# Patient Record
Sex: Female | Born: 1955
Health system: Southern US, Community
[De-identification: ages and names within clinical notes are randomized; demographics above are authoritative.]

## PROBLEM LIST (undated history)

## (undated) DIAGNOSIS — F32A Depression, unspecified: Secondary | ICD-10-CM

## (undated) DIAGNOSIS — F419 Anxiety disorder, unspecified: Secondary | ICD-10-CM

## (undated) DIAGNOSIS — R7303 Prediabetes: Secondary | ICD-10-CM

## (undated) DIAGNOSIS — F329 Major depressive disorder, single episode, unspecified: Secondary | ICD-10-CM

## (undated) DIAGNOSIS — K219 Gastro-esophageal reflux disease without esophagitis: Secondary | ICD-10-CM

## (undated) DIAGNOSIS — J301 Allergic rhinitis due to pollen: Secondary | ICD-10-CM

## (undated) DIAGNOSIS — I10 Essential (primary) hypertension: Secondary | ICD-10-CM

## (undated) HISTORY — DX: Depression, unspecified: F32.A

## (undated) HISTORY — DX: Allergic rhinitis due to pollen: J30.1

## (undated) HISTORY — DX: Gastro-esophageal reflux disease without esophagitis: K21.9

## (undated) HISTORY — DX: Anxiety disorder, unspecified: F41.9

## (undated) HISTORY — DX: Major depressive disorder, single episode, unspecified: F32.9

## (undated) HISTORY — PX: COLON SURGERY: SHX602

---

## 1998-07-20 ENCOUNTER — Other Ambulatory Visit: Admission: RE | Admit: 1998-07-20 | Discharge: 1998-07-20 | Payer: Self-pay | Admitting: Obstetrics and Gynecology

## 1998-08-19 HISTORY — PX: ABDOMINAL HYSTERECTOMY: SHX81

## 1998-08-20 ENCOUNTER — Inpatient Hospital Stay (HOSPITAL_COMMUNITY): Admission: RE | Admit: 1998-08-20 | Discharge: 1998-08-24 | Payer: Self-pay | Admitting: Obstetrics and Gynecology

## 2003-11-19 HISTORY — PX: HERNIA REPAIR: SHX51

## 2003-12-03 ENCOUNTER — Inpatient Hospital Stay (HOSPITAL_COMMUNITY): Admission: EM | Admit: 2003-12-03 | Discharge: 2003-12-08 | Payer: Self-pay | Admitting: Emergency Medicine

## 2003-12-04 ENCOUNTER — Encounter (INDEPENDENT_AMBULATORY_CARE_PROVIDER_SITE_OTHER): Payer: Self-pay | Admitting: Specialist

## 2004-11-01 ENCOUNTER — Ambulatory Visit: Payer: Self-pay | Admitting: Internal Medicine

## 2004-11-09 ENCOUNTER — Ambulatory Visit: Payer: Self-pay | Admitting: Internal Medicine

## 2004-12-23 ENCOUNTER — Ambulatory Visit: Payer: Self-pay | Admitting: Internal Medicine

## 2005-03-22 ENCOUNTER — Encounter: Admission: RE | Admit: 2005-03-22 | Discharge: 2005-03-22 | Payer: Self-pay | Admitting: Internal Medicine

## 2005-11-24 ENCOUNTER — Ambulatory Visit: Payer: Self-pay | Admitting: Internal Medicine

## 2006-03-22 ENCOUNTER — Ambulatory Visit: Payer: Self-pay | Admitting: Internal Medicine

## 2006-08-07 ENCOUNTER — Ambulatory Visit: Payer: Self-pay | Admitting: Internal Medicine

## 2006-08-14 ENCOUNTER — Ambulatory Visit: Payer: Self-pay

## 2006-08-18 ENCOUNTER — Ambulatory Visit (HOSPITAL_COMMUNITY): Admission: RE | Admit: 2006-08-18 | Discharge: 2006-08-18 | Payer: Self-pay | Admitting: Family Medicine

## 2006-08-24 ENCOUNTER — Ambulatory Visit: Payer: Self-pay | Admitting: Family Medicine

## 2006-08-24 LAB — FECAL OCCULT BLOOD, GUAIAC: Fecal Occult Blood: NEGATIVE

## 2007-04-19 ENCOUNTER — Encounter: Payer: Self-pay | Admitting: Internal Medicine

## 2007-05-21 DIAGNOSIS — F329 Major depressive disorder, single episode, unspecified: Secondary | ICD-10-CM | POA: Insufficient documentation

## 2007-05-21 DIAGNOSIS — F411 Generalized anxiety disorder: Secondary | ICD-10-CM | POA: Insufficient documentation

## 2007-05-21 DIAGNOSIS — N951 Menopausal and female climacteric states: Secondary | ICD-10-CM | POA: Insufficient documentation

## 2007-05-22 ENCOUNTER — Ambulatory Visit: Payer: Self-pay | Admitting: Internal Medicine

## 2007-05-22 DIAGNOSIS — R5383 Other fatigue: Secondary | ICD-10-CM

## 2007-05-22 DIAGNOSIS — R5381 Other malaise: Secondary | ICD-10-CM | POA: Insufficient documentation

## 2007-05-23 LAB — CONVERTED CEMR LAB
ALT: 22 units/L (ref 0–40)
Albumin: 3.7 g/dL (ref 3.5–5.2)
BUN: 7 mg/dL (ref 6–23)
Basophils Absolute: 0 10*3/uL (ref 0.0–0.1)
Basophils Relative: 0.6 % (ref 0.0–1.0)
CO2: 30 meq/L (ref 19–32)
Calcium: 8.8 mg/dL (ref 8.4–10.5)
Chloride: 106 meq/L (ref 96–112)
Creatinine, Ser: 0.7 mg/dL (ref 0.4–1.2)
Eosinophils Absolute: 0.1 10*3/uL (ref 0.0–0.6)
Eosinophils Relative: 1.2 % (ref 0.0–5.0)
GFR calc Af Amer: 113 mL/min
GFR calc non Af Amer: 94 mL/min
Glucose, Bld: 92 mg/dL (ref 70–99)
HCT: 43.2 % (ref 36.0–46.0)
Hemoglobin: 14.9 g/dL (ref 12.0–15.0)
Lymphocytes Relative: 28 % (ref 12.0–46.0)
MCHC: 34.5 g/dL (ref 30.0–36.0)
MCV: 96.7 fL (ref 78.0–100.0)
Monocytes Absolute: 0.5 10*3/uL (ref 0.2–0.7)
Monocytes Relative: 7.6 % (ref 3.0–11.0)
Neutro Abs: 3.9 10*3/uL (ref 1.4–7.7)
Neutrophils Relative %: 62.6 % (ref 43.0–77.0)
Phosphorus: 2.7 mg/dL (ref 2.3–4.6)
Platelets: 251 10*3/uL (ref 150–400)
Potassium: 4.6 meq/L (ref 3.5–5.1)
RBC: 4.47 M/uL (ref 3.87–5.11)
RDW: 11.6 % (ref 11.5–14.6)
Sodium: 140 meq/L (ref 135–145)
TSH: 1.19 microintl units/mL (ref 0.35–5.50)
WBC: 6.3 10*3/uL (ref 4.5–10.5)

## 2007-09-24 ENCOUNTER — Telehealth (INDEPENDENT_AMBULATORY_CARE_PROVIDER_SITE_OTHER): Payer: Self-pay | Admitting: *Deleted

## 2007-12-24 ENCOUNTER — Telehealth (INDEPENDENT_AMBULATORY_CARE_PROVIDER_SITE_OTHER): Payer: Self-pay | Admitting: *Deleted

## 2008-03-17 ENCOUNTER — Ambulatory Visit: Payer: Self-pay | Admitting: Internal Medicine

## 2008-05-05 ENCOUNTER — Telehealth (INDEPENDENT_AMBULATORY_CARE_PROVIDER_SITE_OTHER): Payer: Self-pay | Admitting: *Deleted

## 2008-05-22 ENCOUNTER — Telehealth (INDEPENDENT_AMBULATORY_CARE_PROVIDER_SITE_OTHER): Payer: Self-pay | Admitting: *Deleted

## 2008-07-22 ENCOUNTER — Telehealth (INDEPENDENT_AMBULATORY_CARE_PROVIDER_SITE_OTHER): Payer: Self-pay | Admitting: *Deleted

## 2008-09-04 ENCOUNTER — Telehealth: Payer: Self-pay | Admitting: Internal Medicine

## 2008-10-23 ENCOUNTER — Ambulatory Visit: Payer: Self-pay | Admitting: Internal Medicine

## 2008-11-04 ENCOUNTER — Ambulatory Visit: Payer: Self-pay | Admitting: Internal Medicine

## 2008-11-04 LAB — CONVERTED CEMR LAB: Fecal Occult Bld: NEGATIVE

## 2008-11-07 ENCOUNTER — Telehealth: Payer: Self-pay | Admitting: Internal Medicine

## 2008-11-10 ENCOUNTER — Telehealth: Payer: Self-pay | Admitting: Internal Medicine

## 2008-12-08 ENCOUNTER — Encounter (INDEPENDENT_AMBULATORY_CARE_PROVIDER_SITE_OTHER): Payer: Self-pay | Admitting: *Deleted

## 2009-04-13 ENCOUNTER — Telehealth: Payer: Self-pay | Admitting: Internal Medicine

## 2009-11-02 ENCOUNTER — Telehealth: Payer: Self-pay | Admitting: Internal Medicine

## 2009-12-02 ENCOUNTER — Ambulatory Visit: Payer: Self-pay | Admitting: Internal Medicine

## 2009-12-22 ENCOUNTER — Ambulatory Visit: Payer: Self-pay | Admitting: Internal Medicine

## 2009-12-28 ENCOUNTER — Encounter: Payer: Self-pay | Admitting: Internal Medicine

## 2009-12-28 LAB — CONVERTED CEMR LAB: Fecal Occult Bld: NEGATIVE

## 2009-12-30 ENCOUNTER — Telehealth: Payer: Self-pay | Admitting: Internal Medicine

## 2010-02-09 ENCOUNTER — Ambulatory Visit (HOSPITAL_COMMUNITY): Admission: RE | Admit: 2010-02-09 | Discharge: 2010-02-09 | Payer: Self-pay | Admitting: Internal Medicine

## 2010-02-09 LAB — HM MAMMOGRAPHY: HM Mammogram: NEGATIVE

## 2010-02-12 ENCOUNTER — Encounter: Payer: Self-pay | Admitting: Internal Medicine

## 2010-03-01 ENCOUNTER — Telehealth: Payer: Self-pay | Admitting: Internal Medicine

## 2010-03-02 ENCOUNTER — Ambulatory Visit: Payer: Self-pay | Admitting: Internal Medicine

## 2010-05-24 ENCOUNTER — Telehealth: Payer: Self-pay | Admitting: Internal Medicine

## 2010-09-06 ENCOUNTER — Ambulatory Visit: Payer: Self-pay | Admitting: Internal Medicine

## 2010-11-19 ENCOUNTER — Encounter: Payer: Self-pay | Admitting: Internal Medicine

## 2010-11-19 ENCOUNTER — Telehealth: Payer: Self-pay | Admitting: Internal Medicine

## 2011-01-09 ENCOUNTER — Encounter: Payer: Self-pay | Admitting: Internal Medicine

## 2011-01-14 ENCOUNTER — Ambulatory Visit
Admission: RE | Admit: 2011-01-14 | Discharge: 2011-01-14 | Payer: Self-pay | Source: Home / Self Care | Attending: Internal Medicine | Admitting: Internal Medicine

## 2011-01-14 DIAGNOSIS — K219 Gastro-esophageal reflux disease without esophagitis: Secondary | ICD-10-CM | POA: Insufficient documentation

## 2011-01-14 DIAGNOSIS — R350 Frequency of micturition: Secondary | ICD-10-CM | POA: Insufficient documentation

## 2011-01-14 LAB — CONVERTED CEMR LAB
Bilirubin Urine: NEGATIVE
Glucose, Urine, Semiquant: NEGATIVE
Ketones, urine, test strip: NEGATIVE
Nitrite: NEGATIVE
Specific Gravity, Urine: 1.015
Urobilinogen, UA: 0.2
WBC Urine, dipstick: NEGATIVE
pH: 7

## 2011-01-18 NOTE — Letter (Signed)
Summary: Results Follow up Letter  [place] at [facility]  [address] [place], [place] [address]   Phone: [phone]  Fax: [phone]    02/12/2010 MRN: [id number]  [facility] [address] [place], [place]  [address]  Dear Ms. [name],  The following are the results of your recent test(s):  Test         Result    Pap Smear:        Normal _____  Not Normal _____ Comments: ______________________________________________________ Cholesterol: LDL(Bad cholesterol):         Your goal is less than:         HDL (Good cholesterol):       Your goal is more than: Comments:  ______________________________________________________ Mammogram:        Normal __X___  Not Normal _____ Comments: Repeat in 1-2 years  ___________________________________________________________________ Hemoccult:        Normal _____  Not normal _______ Comments:    _____________________________________________________________________ Other Tests:    We routinely do not discuss normal results over the telephone.  If you desire a copy of the results, or you have any questions about this information we can discuss them at your next office visit.   Sincerely,      Tillman Abide, MD

## 2011-01-18 NOTE — Progress Notes (Signed)
Summary: refill request for xanax  Phone Note Refill Request Message from:  Fax from Pharmacy  Refills Requested: Medication #1:  XANAX 0.5 MG TABS Take 1/2-1 tablet by mouth twice a day   Last Refilled: 04/07/2010 Faxed request from [place] is on your desk.  Initial call taken by: Lowella Petties CMA,  May 24, 2010 3:06 PM  Follow-up for Phone Call        okay #60 x 1 Follow-up by: Cindee Salt MD,  May 24, 2010 5:50 PM  Additional Follow-up for Phone Call Additional follow up Details #1::        Rx faxed to pharmacy Additional Follow-up by: DeShannon Smith CMA Duncan Dull),  May 25, 2010 8:39 AM    Prescriptions: Prudy Feeler 0.5 MG TABS (ALPRAZOLAM) Take 1/2-1 tablet by mouth twice a day  #60 x 1   Entered by:   Mervin Hack CMA (AAMA)   Authorized by:   Cindee Salt MD   Signed by:   Mervin Hack CMA (AAMA) on 05/25/2010   Method used:   Handwritten   RxID:   [id number]

## 2011-01-18 NOTE — Letter (Signed)
Summary: Results Follow up Letter  [place] at [facility]  [address] [place], [place] [address]   Phone: [phone]  Fax: [phone]    12/28/2009 MRN: [id number]  [facility] [address] [place], [place]  [address]  Dear [name],  The following are the results of your recent test(s):  Test         Result    Pap Smear:        Normal _____  Not Normal _____ Comments: ______________________________________________________ Cholesterol: LDL(Bad cholesterol):         Your goal is less than:         HDL (Good cholesterol):       Your goal is more than: Comments:  ______________________________________________________ Mammogram:        Normal _____  Not Normal _____ Comments:  ___________________________________________________________________ Hemoccult:        Normal __X___  Not normal _______ Comments:  Stool test is negative for blood we will check this again in a year.  _____________________________________________________________________ Other Tests:    We routinely do not discuss normal results over the telephone.  If you desire a copy of the results, or you have any questions about this information we can discuss them at your next office visit.   Sincerely,     Tillman Abide, MD

## 2011-01-18 NOTE — Assessment & Plan Note (Signed)
Summary: 6 m f/u dlo   Vital Signs:  Patient profile:   55 year old female Weight:      184 pounds BMI:     30.26 Temp:     97.9 degrees F oral Pulse rate:   72 / minute Pulse rhythm:   regular BP sitting:   120 / 80  (left arm) Cuff size:   regular  Vitals Entered By: Mervin Hack CMA Duncan Dull) (September 06, 2010 4:00 PM) CC: 6 month follow-up   History of Present Illness: Doing better Now takes the xanax preemptively if she feels like she is about to panic also goes out and takes a walk, which helps her anxiety also  Tends to have a hard time if she is in a hospital Gets initial feeling like heartburn--then generalized Still takes the xanax at night and it helps her sleep  Still has refill so she has only rarely used more than her bedtime dose  Still with some depression Bad day yesterday but not day after day Upset about husband's condition His cognitive problems have made it hard for her to even talk to him He occ has paranoid tendencies also  Has been fatigued  lesion on nose and  left forehead   Allergies: No Known Drug Allergies  Past History:  Past medical, surgical, family and social histories (including risk factors) reviewed for relevance to current acute and chronic problems.  Past Medical History: Reviewed history from 05/21/2007 and no changes required. Depression Anxiety  Past Surgical History: Reviewed history from 10/23/2008 and no changes required. Hysterectomy BSO-- fibroids  9/99 RIH (ingram)  11/2003 Cardiolite stress 07/2006  Family History: Reviewed history from 10/23/2008 and no changes required. CAD in multiple family members Brother 45  with recent MI Dad with CVA  Social History: Reviewed history from 03/17/2008 and no changes required. Married Never Smoked Alcohol use-no Cares for husband with MS Cleans houses  Review of Systems       still gets itch over left deltoid----fortunately not recently occ food  allergies---intolerant of sauce, then yogurt. Feels nauseated, thenn it passes  Physical Exam  General:  alert and normal appearance.   Skin:  tiny red spot on top of nose scratch on left forehead Psych:  normally interactive, good eye contact, not anxious appearing, and not depressed appearing.     Impression & Recommendations:  Problem # 1:  ANXIETY (ICD-300.00) Assessment Unchanged doing better with behavioral measures (walking) and using the xanax if she needs it (when she feels the panic just starting) Her updated medication list for this problem includes:    Xanax 0.5 Mg Tabs (Alprazolam) .Marland Kitchen... Take 1/2-1 tablet by mouth twice a day  Complete Medication List: 1)  Estradiol 1 Mg Tabs (Estradiol) .... Take 1 by mouth once daily 2)  Xanax 0.5 Mg Tabs (Alprazolam) .... Take 1/2-1 tablet by mouth twice a day 3)  Oyster Calcium + D 500-125 Mg-unit Tabs (Calcium-vitamin d) .... Take 2 by mouth daily  Patient Instructions: 1)  Please schedule a follow-up appointment in 6 months for physical  Current Allergies (reviewed today): No known allergies

## 2011-01-18 NOTE — Progress Notes (Signed)
Summary: Rx Alprazolam  Phone Note Refill Request Call back at [phone] Message from:  [facility] on December 30, 2009 11:38 AM  Refills Requested: Medication #1:  XANAX 0.5 MG TABS Take 1/2-1 tablet by mouth twice a day   Last Refilled: 11/02/2009 Received faxed refill request, please advise   Method Requested: Electronic Initial call taken by: Linde Gillis CMA Duncan Dull),  December 30, 2009 11:39 AM  Follow-up for Phone Call        okay #60 x 0 Follow-up by: Cindee Salt MD,  December 30, 2009 3:30 PM  Additional Follow-up for Phone Call Additional follow up Details #1::        Rx called to pharmacy Additional Follow-up by: Linde Gillis CMA Duncan Dull),  December 30, 2009 3:53 PM

## 2011-01-18 NOTE — Assessment & Plan Note (Signed)
Summary: PANIC ATTACKS/CLE   Vital Signs:  Patient profile:   55 year old female Weight:      181 pounds Temp:     98.1 degrees F oral Pulse rate:   64 / minute Pulse rhythm:   regular BP sitting:   130 / 90  (left arm) Cuff size:   regular  Vitals Entered By: Mervin Hack CMA Duncan Dull) (March 02, 2010 10:23 AM) CC: panic attack   History of Present Illness: Having more freq  panic attacks again Having back in the fall and seemed to improve after she stopped benedryl More of an issue back in  2006 Feels "like labor pains in my chest" Last 20-40 minutes Has awakened her at night  feels like she is going to have a heart attack In past would get forewarning like heartburn type discomfort  Does have triggers with any hospital exposure when they come on, generally isn't doing anything  Last one was 8 days ago very hard to gauge but not that frequent  Has circumstantatial depression Can have bad times but she always continues with all her responsibilities Most it last is 1 day  Allergies: No Known Drug Allergies  Past History:  Past medical, surgical, family and social histories (including risk factors) reviewed for relevance to current acute and chronic problems.  Past Medical History: Reviewed history from 05/21/2007 and no changes required. Depression Anxiety  Past Surgical History: Reviewed history from 10/23/2008 and no changes required. Hysterectomy BSO-- fibroids  9/99 RIH (ingram)  11/2003 Cardiolite stress 07/2006  Family History: Reviewed history from 10/23/2008 and no changes required. CAD in multiple family members Brother 39  with recent MI Dad with CVA  Social History: Reviewed history from 03/17/2008 and no changes required. Married Never Smoked Alcohol use-no Cares for husband with MS Cleans houses  Review of Systems       Uses the xanax at bedtime--  0.5mg  nightly (at 8PM, then goes to sleep at 10PM) weight is stable  Physical  Exam  General:  alert and normal appearance.   Psych:  normally interactive, good eye contact, not anxious appearing, and not depressed appearing.     Impression & Recommendations:  Problem # 1:  ANXIETY (ICD-300.00) Assessment Comment Only has panic attacks intermittently discussed using the xanax would consider daily SSRI if having more than 1 panic attack weekly  Her updated medication list for this problem includes:    Xanax 0.5 Mg Tabs (Alprazolam) .Marland Kitchen... Take 1/2-1 tablet by mouth twice a day  Complete Medication List: 1)  Estradiol 1 Mg Tabs (Estradiol) .... Take 1 by mouth once daily 2)  Xanax 0.5 Mg Tabs (Alprazolam) .... Take 1/2-1 tablet by mouth twice a day 3)  Oyster Calcium + D 500-125 Mg-unit Tabs (Calcium-vitamin d) .... Take 2 by mouth daily  Patient Instructions: 1)  Please call if the panic attacks occur more than one weekly on average 2)  Please schedule a follow-up appointment in 6 months .   Current Allergies (reviewed today): No known allergies

## 2011-01-18 NOTE — Progress Notes (Signed)
Summary: xanax  Phone Note Refill Request Message from:  Fax from Pharmacy on November 19, 2010 11:38 AM  Refills Requested: Medication #1:  XANAX 0.5 MG TABS Take 1/2-1 tablet by mouth twice a day   Last Refilled: 09/04/2010 Refill request from [place]. [phone]. Fax is on your desk.    Initial call taken by: Melody Comas,  November 19, 2010 11:40 AM  Follow-up for Phone Call        okay #60 x 1 Follow-up by: Cindee Salt MD,  November 19, 2010 1:07 PM  Additional Follow-up for Phone Call Additional follow up Details #1::        Rx called to pharmacy Additional Follow-up by: Linde Gillis CMA Duncan Dull),  November 19, 2010 2:06 PM    Prescriptions: XANAX 0.5 MG TABS (ALPRAZOLAM) Take 1/2-1 tablet by mouth twice a day  #60 x 1   Entered by:   Linde Gillis CMA (AAMA)   Authorized by:   Cindee Salt MD   Signed by:   Linde Gillis CMA (AAMA) on 11/19/2010   Method used:   Telephoned to ...       [facility]* (retail)       [address]       [place], [place]  [address]       Ph: [phone]       Fax: [phone]   RxID:   [id number]

## 2011-01-18 NOTE — Progress Notes (Signed)
Summary: Rx Alprazolam  Phone Note Refill Request Call back at [phone] Message from:  [facility] on March 01, 2010 10:08 AM  Refills Requested: Medication #1:  XANAX 0.5 MG TABS Take 1/2-1 tablet by mouth twice a day   Last Refilled: 12/30/2009 Received faxed refill request, form in your IN box.     Method Requested: Fax to Local Pharmacy Initial call taken by: Linde Gillis CMA Duncan Dull),  March 01, 2010 10:08 AM  Follow-up for Phone Call        okay #60 x 1 Follow-up by: Cindee Salt MD,  March 01, 2010 1:25 PM  Additional Follow-up for Phone Call Additional follow up Details #1::        [facility] [place] [facility] Additional Follow-up by: Mervin Hack CMA Duncan Dull),  March 01, 2010 2:38 PM    Prescriptions: Prudy Feeler 0.5 MG TABS (ALPRAZOLAM) Take 1/2-1 tablet by mouth twice a day  #60 x 1   Entered by:   Mervin Hack CMA (AAMA)   Authorized by:   Cindee Salt MD   Signed by:   Mervin Hack CMA (AAMA) on 03/01/2010   Method used:   Handwritten   RxID:   [id number]

## 2011-01-20 NOTE — Assessment & Plan Note (Signed)
Summary: 8:15 PERSONAL/CLE   Vital Signs:  Patient profile:   55 year old female Weight:      185 pounds Temp:     98.4 degrees F oral Pulse rate:   73 / minute Pulse rhythm:   regular BP sitting:   133 / 85  (left arm) Cuff size:   regular  Vitals Entered By: Mervin Hack CMA Duncan Dull) (January 14, 2011 8:26 AM) CC: personal   History of Present Illness: Concerned about prominence of right inner canthus Thinks it is the tear duct No pain No epiphoria No discharge in AM  Most concerned about urinary frequency May occur again within minutes if she drinks tea Went 14 times in 1 day "I'm tired of it" Ready to try meds  No hematuria No dysuria  More problems with stomach  gets sick after certain meals no clear water brash tried husbnad's "purple pill" and symtpoms gone  Allergies: No Known Drug Allergies  Past History:  Past medical, surgical, family and social histories (including risk factors) reviewed for relevance to current acute and chronic problems.  Past Medical History: Depression Anxiety GERD  Past Surgical History: Reviewed history from 10/23/2008 and no changes required. Hysterectomy BSO-- fibroids  9/99 RIH (ingram)  11/2003 Cardiolite stress 07/2006  Family History: Reviewed history from 10/23/2008 and no changes required. CAD in multiple family members Brother 29  with recent MI Dad with CVA  Social History: Reviewed history from 03/17/2008 and no changes required. Married Never Smoked Alcohol use-no Cares for husband with MS Cleans houses  Review of Systems       Some constipation Uses metamucil--should be able to stop on weight watchers since increased roughage keeps her regular  Physical Exam  Eyes:  pupils equal, pupils round, and pupils reactive to light.  Prominent inner canthi but no abnormalities noted Tear ducts look normal Abdomen:  soft, non-tender, and no masses.   Msk:  No CVA tenderness   Impression &  Recommendations:  Problem # 1:  FREQUENCY, URINARY (ICD-788.41) Assessment Deteriorated  ready to try meds will try oxybutynin discussed side effects--mostly dry mouth and dizziness  Her updated medication list for this problem includes:    Oxybutynin Chloride 10 Mg Xr24h-tab (Oxybutynin chloride) .Marland Kitchen... 1 daily to reduce urinary frequency  Problem # 2:  GERD (ICD-530.81) Assessment: New  will give omeprazole for as needed use discussed benefit if she gets symptom relief without daily dosing  Her updated medication list for this problem includes:    Omeprazole 20 Mg Cpdr (Omeprazole) .Marland Kitchen... 1 daily as needed for acid reflux symptoms  Complete Medication List: 1)  Estradiol 1 Mg Tabs (Estradiol) .... Take 1 by mouth once daily 2)  Xanax 0.5 Mg Tabs (Alprazolam) .... Take 1/2-1 tablet by mouth twice a day 3)  Oyster Calcium + D 500-125 Mg-unit Tabs (Calcium-vitamin d) .... Take 2 by mouth daily 4)  Omeprazole 20 Mg Cpdr (Omeprazole) .Marland Kitchen.. 1 daily as needed for acid reflux symptoms 5)  Oxybutynin Chloride 10 Mg Xr24h-tab (Oxybutynin chloride) .Marland Kitchen.. 1 daily to reduce urinary frequency  Patient Instructions: 1)  Please keep March physical appointment Prescriptions: OXYBUTYNIN CHLORIDE 10 MG XR24H-TAB (OXYBUTYNIN CHLORIDE) 1 daily to reduce urinary frequency  #30 x 11   Entered and Authorized by:   Cindee Salt MD   Signed by:   Cindee Salt MD on 01/14/2011   Method used:   Electronically to        [facility]* (retail)  [address]       [place], [place]  [address]       Ph: [phone]       Fax: [phone]   RxID:   [id number] OMEPRAZOLE 20 MG CPDR (OMEPRAZOLE) 1 daily as needed for acid reflux symptoms  #30 x 11   Entered and Authorized by:   Cindee Salt MD   Signed by:   Cindee Salt MD on 01/14/2011   Method used:   Electronically to        [facility]* (retail)       [address]       [place], [place]  [address]       Ph:  [phone]       Fax: [phone]   RxID:   [id number]      Current Allergies (reviewed today): No known allergies   Laboratory Results   Urine Tests  Date/Time Received: January 14, 2011 8:50 AM Date/Time Reported: January 14, 2011 8:50 AM  Routine Urinalysis   Color: yellow Appearance: Hazy Glucose: negative   (Normal Range: Negative) Bilirubin: negative   (Normal Range: Negative) Ketone: negative   (Normal Range: Negative) Spec. Gravity: 1.015   (Normal Range: 1.003-1.035) Blood: trace-lysed   (Normal Range: Negative) pH: 7.0   (Normal Range: 5.0-8.0) Protein: trace   (Normal Range: Negative) Urobilinogen: 0.2   (Normal Range: 0-1) Nitrite: negative   (Normal Range: Negative) Leukocyte Esterace: negative   (Normal Range: Negative)

## 2011-01-20 NOTE — Medication Information (Signed)
Summary: Alprazolam/[facility]  Alprazolam/[facility]   Imported By: Lanelle Bal 12/03/2010 09:42:29  _____________________________________________________________________  External Attachment:    Type:   Image     Comment:   External Document

## 2011-01-26 ENCOUNTER — Encounter: Payer: Self-pay | Admitting: Internal Medicine

## 2011-02-17 ENCOUNTER — Other Ambulatory Visit: Payer: Self-pay | Admitting: Internal Medicine

## 2011-02-21 ENCOUNTER — Ambulatory Visit (INDEPENDENT_AMBULATORY_CARE_PROVIDER_SITE_OTHER): Payer: BC Managed Care – PPO | Admitting: Family Medicine

## 2011-02-21 ENCOUNTER — Other Ambulatory Visit: Payer: Self-pay | Admitting: Family Medicine

## 2011-02-21 ENCOUNTER — Encounter: Payer: Self-pay | Admitting: Family Medicine

## 2011-02-21 DIAGNOSIS — N644 Mastodynia: Secondary | ICD-10-CM

## 2011-02-24 ENCOUNTER — Encounter (INDEPENDENT_AMBULATORY_CARE_PROVIDER_SITE_OTHER): Payer: Self-pay | Admitting: *Deleted

## 2011-02-24 ENCOUNTER — Ambulatory Visit
Admission: RE | Admit: 2011-02-24 | Discharge: 2011-02-24 | Disposition: A | Discharge status: Home / Self Care | Payer: BC Managed Care – PPO | Source: Ambulatory Visit | Attending: Family Medicine | Admitting: Family Medicine

## 2011-02-24 DIAGNOSIS — N644 Mastodynia: Secondary | ICD-10-CM

## 2011-02-28 ENCOUNTER — Telehealth: Payer: Self-pay | Admitting: Internal Medicine

## 2011-03-01 NOTE — Miscellaneous (Signed)
Summary: Mammogram to flowsheet  Clinical Lists Changes  Observations: Added new observation of MAMMO DUE: 02/2012 (02/24/2011 16:43) Added new observation of MAMMOGRAM: normal (02/24/2011 16:43)      Preventive Care Screening  Mammogram:    Date:  02/24/2011    Next Due:  02/2012    Results:  normal     Birads 1

## 2011-03-01 NOTE — Assessment & Plan Note (Signed)
Summary: SHOOTING PAINS IN BREASTS   Vital Signs:  Patient profile:   55 year old female Weight:      186.75 pounds Temp:     98.3 degrees F oral Pulse rate:   76 / minute Pulse rhythm:   regular BP sitting:   124 / 80  (left arm) Cuff size:   large  Vitals Entered By: Selena Batten Dance CMA Duncan Dull) (February 21, 2011 8:17 AM) CC: Breast pains   History of Present Illness: CC: breast pain  2wk h/o sharp shooting breast pains R>L start middle of breast and shoot out towards nipple.  currently pain resolved but tender around areola.  No discharge, no lumps/bumps.  Does have h/o lumpy breasts.    Hysterectomy 1999.  Taking estrogen pill for several years now.  started oxybutynin 1 month ago (helps a little).  Due for mammogram, awaiting to schedule mammo to see what we think.  Previously have been normal.  Current Medications (verified): 1)  Estradiol 1 Mg Tabs (Estradiol) .... Take 1 By Mouth Once Daily 2)  Xanax 0.5 Mg Tabs (Alprazolam) .... Take 1/2-1 Tablet By Mouth Twice A Day 3)  Oyster Calcium + D 500-125 Mg-Unit Tabs (Calcium-Vitamin D) .... Take 2 By Mouth Daily 4)  Omeprazole 20 Mg Cpdr (Omeprazole) .Marland Kitchen.. 1 Daily As Needed For Acid Reflux Symptoms 5)  Oxybutynin Chloride 10 Mg Xr24h-Tab (Oxybutynin Chloride) .Marland Kitchen.. 1 Daily To Reduce Urinary Frequency 6)  St Johns Wort 300 Mg Caps ([address] Johns Wort) .Marland Kitchen.. 1 By Mouth Three Times A Day 7)  Multivitamins  Tabs (Multiple Vitamin) .Marland Kitchen.. 1 By Mouth Once Daily  Allergies (verified): 1)  * Eggs  Past History:  Past Medical History: Last updated: 01/14/2011 Depression Anxiety GERD  Family History: CAD in multiple family members Brother 50 with recent MI Dad with CVA Mgreat aunt - breast CA  Social History: Reviewed history from 03/17/2008 and no changes required. Married Never Smoked Alcohol use-no Cares for husband with MS Cleans houses  Review of Systems       per HPI  Physical Exam  General:  alert and normal appearance.    Mouth:  Oral mucosa and oropharynx without lesions or exudates.  Teeth in good repair. Neck:  No deformities, masses, or tenderness noted. Breasts:  No mass, nodules, thickening, tenderness, bulging, retraction, inflamation, nipple discharge or skin changes noted.  no supraclavicular or axillary LAD appreciated.  some tenderness R breast inferior to areola but no mass appreciated Lungs:  Normal respiratory effort, chest expands symmetrically. Lungs are clear to auscultation, no crackles or wheezes. Heart:  Normal rate and regular rhythm. S1 and S2 normal without gallop, murmur, click, rub or other extra sounds.   Impression & Recommendations:  Problem # 1:  MASTALGIA (ICD-611.71) R>L centered around areola.  unclear etiology of breast pain.  exam reassuring.  no evidence of infection or mass.  no significant ductal ectasia.  referral for dx mammo/US.    Orders: Radiology Referral (Radiology)  Complete Medication List: 1)  Estradiol 1 Mg Tabs (Estradiol) .... Take 1 by mouth once daily 2)  Xanax 0.5 Mg Tabs (Alprazolam) .... Take 1/2-1 tablet by mouth twice a day 3)  Oyster Calcium + D 500-125 Mg-unit Tabs (Calcium-vitamin d) .... Take 2 by mouth daily 4)  Omeprazole 20 Mg Cpdr (Omeprazole) .Marland Kitchen.. 1 daily as needed for acid reflux symptoms 5)  Oxybutynin Chloride 10 Mg Xr24h-tab (Oxybutynin chloride) .Marland Kitchen.. 1 daily to reduce urinary frequency 6)  [employer] Wort  300 Mg Caps (St johns wort) .Marland Kitchen.. 1 by mouth three times a day 7)  Multivitamins Tabs (Multiple vitamin) .Marland Kitchen.. 1 by mouth once daily  Patient Instructions: 1)  I'm not sure what caused the pain. 2)  Recommed trying tylenol for discomfort as well as supportive bras. 3)  Pass by Marion's office to schedule diagnostic mammogram. 4)  Call clinic with questions. 5)  Keep appointment for end of March.   Orders Added: 1)  Radiology Referral [Radiology] 2)  Est. Patient Level III [57846]    Current Allergies (reviewed today): * EGGS

## 2011-03-01 NOTE — Letter (Signed)
Summary: Results Follow up Letter  [place] at [facility]  [address] [place], [place] [address]   Phone: [phone]  Fax: [phone]    02/24/2011 MRN: [id number]      [facility] [address] [place], [place]  [address]  [place]      Dear Ms. Bucholz,  The following are the results of your recent test(s):  Test         Result    Pap Smear:        Normal _____  Not Normal _____ Comments: ______________________________________________________ Cholesterol: LDL(Bad cholesterol):         Your goal is less than:         HDL (Good cholesterol):       Your goal is more than: Comments:  ______________________________________________________ Mammogram:        Normal __X__  Not Normal _____ Comments: Repeat in 1 year  ___________________________________________________________________ Hemoccult:        Normal _____  Not normal _______ Comments:    _____________________________________________________________________ Other Tests:    We routinely do not discuss normal results over the telephone.  If you desire a copy of the results, or you have any questions about this information we can discuss them at your next office visit.   Sincerely,       Dr. Eustaquio Boyden

## 2011-03-08 NOTE — Progress Notes (Signed)
Summary: pt requests something for yeast  Phone Note Call from Patient Call back at [phone]   Caller: Patient Summary of Call: Pt states she has a vaginal yeast infection and is asking that something be called to [facility].  She has thick white discharge, some itching. She says you usually call something for her. Initial call taken by: Lowella Petties CMA, AAMA,  February 28, 2011 9:45 AM  Follow-up for Phone Call        okay fluconazole 150mg  #2 x 1  Take 1 today for yeast infection and repeat in 1 week as needed  Follow-up by: Cindee Salt MD,  February 28, 2011 10:02 AM  Additional Follow-up for Phone Call Additional follow up Details #1::        Rx Called In, Spoke with patient and advised results.  Additional Follow-up by: Mervin Hack CMA Duncan Dull),  February 28, 2011 4:10 PM    New/Updated Medications: FLUCONAZOLE 150 MG TABS (FLUCONAZOLE) Take 1 today for yeast infection and repeat in 1 week as needed Prescriptions: FLUCONAZOLE 150 MG TABS (FLUCONAZOLE) Take 1 today for yeast infection and repeat in 1 week as needed  #2 x 1   Entered by:   Mervin Hack CMA (AAMA)   Authorized by:   Cindee Salt MD   Signed by:   Mervin Hack CMA (AAMA) on 02/28/2011   Method used:   Electronically to        [facility]* (retail)       [address]       [place], [place]  [address]       Ph: [phone]       Fax: [phone]   RxID:   [id number]

## 2011-03-14 ENCOUNTER — Ambulatory Visit (INDEPENDENT_AMBULATORY_CARE_PROVIDER_SITE_OTHER): Payer: BC Managed Care – PPO | Admitting: Internal Medicine

## 2011-03-14 ENCOUNTER — Encounter: Payer: Self-pay | Admitting: Internal Medicine

## 2011-03-14 VITALS — BP 135/91 | HR 67 | Temp 97.9°F | Ht 65.5 in | Wt 185.0 lb

## 2011-03-14 DIAGNOSIS — L570 Actinic keratosis: Secondary | ICD-10-CM

## 2011-03-14 DIAGNOSIS — Z1211 Encounter for screening for malignant neoplasm of colon: Secondary | ICD-10-CM

## 2011-03-14 DIAGNOSIS — R350 Frequency of micturition: Secondary | ICD-10-CM

## 2011-03-14 DIAGNOSIS — N951 Menopausal and female climacteric states: Secondary | ICD-10-CM

## 2011-03-14 DIAGNOSIS — K219 Gastro-esophageal reflux disease without esophagitis: Secondary | ICD-10-CM

## 2011-03-14 DIAGNOSIS — F411 Generalized anxiety disorder: Secondary | ICD-10-CM

## 2011-03-14 DIAGNOSIS — Z0000 Encounter for general adult medical examination without abnormal findings: Secondary | ICD-10-CM

## 2011-03-14 NOTE — Progress Notes (Signed)
Subjective:    Patient ID: [name], female    DOB: [birth year], 55 y.o.   MRN: [id number]  HPI DOing okay Notes a couple of pimples on right ear Just above tragus--noted a couple of weeks ago Then looked in mirror and found one further inside  Anxiety is better No recent attacks Hasn't needed xanax in some time Not depressed--thought stress with aunt dying  Past Medical History  Diagnosis Date  . Depression   . Anxiety   . GERD (gastroesophageal reflux disease)     Past Surgical History  Procedure Date  . Abdominal hysterectomy 08/1998    BSO fibroids  . Hernia repair 11/2003    RIH (ingram)    Family History  Problem Relation Age of Onset  . Stroke Father   . Heart disease Brother 78    recent MI    History   Social History  . Marital Status: Married    Spouse Name: N/A    Number of Children: N/A  . Years of Education: N/A   Occupational History  . Not on file.   Social History Main Topics  . Smoking status: Never Smoker   . Smokeless tobacco: Not on file  . Alcohol Use: No  . Drug Use: Not on file  . Sexually Active: Not on file   Other Topics Concern  . Not on file   Social History Narrative   Cares for husband with MS      Review of Systems  Constitutional: Positive for fatigue.       [Has gained about 10# Appetite is strong--esp if she is stressed Wears seat belt Occ fatigue HENT: Positive for hearing loss and tinnitus. Negative for dental problem.        [Stable right ear hearing loss Occ tinnitus Some phlegm in throat since on oxybutynin Eyes: Negative for visual disturbance.       [No diplopia No vision loss Respiratory: Negative for cough and shortness of breath.   Cardiovascular: Negative for chest pain, palpitations and leg swelling.  Gastrointestinal: Negative for abdominal pain, constipation and blood in stool.  Genitourinary: Negative for dysuria.       [Incontinence is better on the oxybutynin--worth taking No sexual  problems Breast pains are gone Musculoskeletal: Negative for back pain, joint swelling and arthralgias.  Skin: Negative for color change and pallor.       [Has dry skin Neurological: Negative for dizziness, syncope, weakness, numbness and headaches.  Psychiatric/Behavioral: Negative for sleep disturbance and dysphoric mood. The patient is not nervous/anxious.        Objective:   Physical Exam  Constitutional: She is oriented to person, place, and time. She appears well-developed and well-nourished. No distress.  HENT:  Right Ear: External ear normal.  Left Ear: External ear normal.  Mouth/Throat: Oropharynx is clear and moist. No oropharyngeal exudate.       TMs normal  Eyes: Conjunctivae and EOM are normal. Pupils are equal, round, and reactive to light. Right eye exhibits no discharge.       Fundi benign  Neck: Normal range of motion. Neck supple. No thyromegaly present.  Cardiovascular: Normal rate, regular rhythm, normal heart sounds and intact distal pulses.  Exam reveals no gallop.   No murmur heard. Pulmonary/Chest: Effort normal and breath sounds normal. No respiratory distress. She has no wheezes. She has no rales.  Abdominal: Soft. She exhibits no distension and no mass. There is no tenderness.  Genitourinary:  Pelvic deferred due to hysterectomy Breast exam just done  Musculoskeletal: Normal range of motion. She exhibits no edema and no tenderness.  Lymphadenopathy:    She has no cervical adenopathy.  Neurological: She is alert and oriented to person, place, and time. She exhibits normal muscle tone. Coordination normal.       Normal strength  Skin: Skin is warm. No rash noted. No erythema.       Scaly lesion above right tragus Pearly papule on inside of right helix  Psychiatric: She has a normal mood and affect. Judgment and thought content normal.          Assessment & Plan:

## 2011-03-14 NOTE — Patient Instructions (Signed)
Please do the stool test Please try 1/2 of the estradiol once or twice a week---and keep cutting down to find the lowest effective dose

## 2011-03-15 LAB — CBC WITH DIFFERENTIAL/PLATELET
Basophils Absolute: 0 10*3/uL (ref 0.0–0.1)
Basophils Relative: 0.6 % (ref 0.0–3.0)
Eosinophils Absolute: 0.1 10*3/uL (ref 0.0–0.7)
Eosinophils Relative: 1.2 % (ref 0.0–5.0)
HCT: 41.9 % (ref 36.0–46.0)
Hemoglobin: 14.4 g/dL (ref 12.0–15.0)
Lymphocytes Relative: 28.9 % (ref 12.0–46.0)
Lymphs Abs: 1.9 10*3/uL (ref 0.7–4.0)
MCHC: 34.3 g/dL (ref 30.0–36.0)
MCV: 97.9 fl (ref 78.0–100.0)
Monocytes Absolute: 0.5 10*3/uL (ref 0.1–1.0)
Monocytes Relative: 7.4 % (ref 3.0–12.0)
Neutro Abs: 4.1 10*3/uL (ref 1.4–7.7)
Neutrophils Relative %: 61.9 % (ref 43.0–77.0)
Platelets: 229 10*3/uL (ref 150.0–400.0)
RBC: 4.28 Mil/uL (ref 3.87–5.11)
RDW: 12.4 % (ref 11.5–14.6)
WBC: 6.7 10*3/uL (ref 4.5–10.5)

## 2011-03-15 LAB — HEPATIC FUNCTION PANEL
ALT: 19 U/L (ref 0–35)
AST: 22 U/L (ref 0–37)
Albumin: 3.7 g/dL (ref 3.5–5.2)
Alkaline Phosphatase: 80 U/L (ref 39–117)
Bilirubin, Direct: 0.1 mg/dL (ref 0.0–0.3)
Total Bilirubin: 0.3 mg/dL (ref 0.3–1.2)
Total Protein: 6.5 g/dL (ref 6.0–8.3)

## 2011-03-15 LAB — BASIC METABOLIC PANEL
BUN: 11 mg/dL (ref 6–23)
CO2: 31 mEq/L (ref 19–32)
Calcium: 8.6 mg/dL (ref 8.4–10.5)
Chloride: 105 mEq/L (ref 96–112)
Creatinine, Ser: 0.6 mg/dL (ref 0.4–1.2)
GFR: 112.39 mL/min (ref >60.00)
Glucose, Bld: 80 mg/dL (ref 70–99)
Potassium: 4 mEq/L (ref 3.5–5.1)
Sodium: 141 mEq/L (ref 135–145)

## 2011-03-15 LAB — TSH: TSH: 1.44 u[IU]/mL (ref 0.35–5.50)

## 2011-03-19 NOTE — Progress Notes (Signed)
Order cancelled Will be reinstated when she returns cards

## 2011-03-22 ENCOUNTER — Other Ambulatory Visit: Payer: BC Managed Care – PPO

## 2011-03-22 ENCOUNTER — Other Ambulatory Visit (INDEPENDENT_AMBULATORY_CARE_PROVIDER_SITE_OTHER): Payer: BC Managed Care – PPO | Admitting: Internal Medicine

## 2011-03-22 DIAGNOSIS — Z1211 Encounter for screening for malignant neoplasm of colon: Secondary | ICD-10-CM

## 2011-03-22 LAB — FECAL OCCULT BLOOD, IMMUNOCHEMICAL: Fecal Occult Bld: NEGATIVE

## 2011-03-29 ENCOUNTER — Encounter: Payer: Self-pay | Admitting: *Deleted

## 2011-04-19 NOTE — Progress Notes (Signed)
Addended byMills Koller on: 04/19/2011 12:14 PM   Modules accepted: Orders

## 2011-05-06 NOTE — H&P (Signed)
NAMEAUBRIAUNA, RINER NO.:  [id number]   MEDICAL RECORD NO.:  [id number]                   PATIENT TYPE:  EMS   LOCATION:  MAJO                                 FACILITY:  [facility]   PHYSICIAN:  Angelia Mould. Derrell Lolling, M.D.             DATE OF BIRTH:  [birth year]   DATE OF ADMISSION:  12/03/2003  DATE OF DISCHARGE:                                HISTORY & PHYSICAL   CHIEF COMPLAINT:  Abdominal pain and vomiting.   HISTORY OF PRESENT ILLNESS:  This is a 55 year old white female who states  that she developed sudden onset of right lower quadrant pain at 9 o'clock  a.m. yesterday.  The pain has waxed and waned but generally is getting  worse.  She had a small bowel movement yesterday morning, but since that  time has passed no flatus or stool.  She states her urine output is  diminished.  She has vomited about 10 times every time she tries to drink  anything.   She has had prior episodes of right-sided abdominal pain and in fact was  worked up two years ago.  She says she had a CT scan and was told she had  adhesions.  She has had this pain before, but she has never had vomiting.   I was called to see the patient by Dr. Carren Rang after a CT scan  suggested a bowel obstruction and her abdominal wall hernia.   PAST MEDICAL HISTORY:  1. Right ear operation as a young woman.  2. Borderline hypertension.  3. Tubal ligation at age 44.  4. Total abdominal hysterectomy and bilateral salpingo-oophorectomy in 1999.     Apparently this was done laparoscopically, but she was re-explored     through a Pfannenstiel incision on postop day #1 for bleeding.  5. Again workup for chronic abdominal pain in 2002.   CURRENT MEDICATIONS:  None except for multivitamins.   ALLERGIES:  No known drug allergies.   FAMILY HISTORY:  Mother living has coronary artery disease, had an MI, has  arthritis and hypertension.  Father living has bursitis and hypertension.  Two brothers  and two sisters; one sister has had a cholecystectomy and  hysterectomy.   SOCIAL HISTORY:  The patient is married, lives in [place] with her  husband.  Her husband is disabled.  They have two children.  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  Her husband has multiple sclerosis.  She denies the use of alcohol or tobacco.   REVIEW OF SYSTEMS:  All systems are reviewed.  They are noncontributory  except as described above.   PHYSICAL EXAMINATION:  GENERAL:  Pleasant, overweight, middle-aged white  female in mild distress.  VITAL SIGNS:  Temperature 99.8, pulse 70, respirations 18, blood pressure  169/101.  HEENT:  Eyes: Sclerae clear.  Extraocular movements intact.  Ears, nose,  mouth, and throat: Nose,  lips, and tongue, and oropharynx without lesions.  NECK:  Supple, nontender, no adenopathy, no thyromegaly, no jugular venous  distention.  RESPIRATORY:  Lungs clear to auscultation.  No chest wall tenderness.  HEART:  Regular rate and rhythm, no murmur.  BREASTS:  Not examined.  ABDOMEN:  Somewhat obese. The abdomen is generally soft, perhaps borderline  distended.  Somewhat difficult to tell because of her body habitus.  There  are no skin changes really.  Bowel sounds are diminished.  In the right  lower quadrant, there is a palpable mass which is very tender and reproduces  her pain exactly.  This seems to be just above the inguinal ligament.  There  is no evidence of any femoral hernia.  The left side of the abdomen is soft.  Liver and spleen are not enlarged.  EXTREMITIES:  She moves all four extremities well without pain or deformity.  There is no peripheral edema.  NEUROLOGIC:  No gross motor or sensory deficits.   ADMISSION LABORATORY DATA:  The CT scan is examined.  You can see a  spigelian hernia on the right with a loop of small bowel caught between the  external oblique and internal oblique layers just lateral to the lateral  edge of the right rectus  muscle.  The proximal small bowel is significantly  distended.  The distal small bowel is collapsed, strongly suggesting a  transition zone and small-bowel obstruction.  She has a little bit of fluid  in the pelvis.  She has gallstones, but the gallbladder does not look  inflamed.   Lab work shows a white blood cell count of 11,100, hemoglobin 17.3.  Complete metabolic panel is essentially normal.  Urinalysis is basically  normal, slightly concentrated.   IMPRESSION:  1. Incarcerated right spigelian hernia with acute small-bowel obstruction.  2. Gallstones, suspect these are asymptomatic.  3. Status post abdominal hysterectomy and bilateral salpingo-oophorectomy.  4. Borderline hypertension.   PLAN:  The patient will be taken to the operating room urgently for  exploration of the abdomen for her small-bowel obstruction and repair of her  right-sided spigelian abdominal wall hernia.   I have discussed the indications and details of surgery with her.  Risks and  complications have been outlined, including but not limited to bleeding,  infection, extension of the incision to accommodate more extensive surgery  if necessary and injury to adjacent organs, wound problems such as infection  or recurrent hernia, cardiac, pulmonary, and thromboembolic problems.  She  seems to understand these issues well.  At this time, all of her questions  are answered.  She and her husband are both willing to have this done as  soon as possible.                                                Angelia Mould. Derrell Lolling, M.D.    HMI/MEDQ  D:  12/03/2003  T:  12/03/2003  Job:  [id number]   cc:   Dineen Kid. Rana Snare, M.D.  [address]  [place]  [place] [address]  Fax: [phone]

## 2011-05-06 NOTE — Op Note (Signed)
[name], [name]                              ACCOUNT NO.:  [id number]   MEDICAL RECORD NO.:  [id number]                   PATIENT TYPE:  INP   LOCATION:  2550                                 FACILITY:  [facility]   PHYSICIAN:  Angelia Mould. Derrell Lolling, M.D.             DATE OF BIRTH:  1956-07-05   DATE OF PROCEDURE:  12/03/2003  DATE OF DISCHARGE:                                 OPERATIVE REPORT   PREOPERATIVE DIAGNOSIS:  Incarcerated right Spigelian hernia with small  bowel obstruction.   POSTOPERATIVE DIAGNOSIS:  Incarcerated right Spigelian hernia with small  bowel obstruction.   OPERATION PERFORMED:  Exploration and repair of incarcerated right Spigelian  hernia with polypropylene mesh, release of small bowel obstruction.   SURGEON:  Angelia Mould. Derrell Lolling, M.D.   ANESTHESIA:  General.   INDICATIONS FOR PROCEDURE:  The patient is a 55 year old white female who  presented to the emergency room with a 20-hour history of right lower  quadrant pain and vomiting and constipation.  She had had some prior similar  episodes that were not as bad.  The emergency room physicians evaluated her.  They obtained a CT scan.  That showed an incarcerated right Spigelian hernia  with dilated proximal small bowel and collapsed distal small bowel. There  appeared to be a loop of small bowel caught in the hernia.  She was brought  to the operating room urgently.   OPERATIVE FINDINGS:  The patient had a right Spigelian hernia.  There was a  small 2.0 cm neck to the hernia and then a hernia sac that was about 6 cm in  size.  Once I opened the sac, the small bowel had already reduced.  There  was some hemorrhage within the sac but there was only serous fluid coming  from the abdomen which had no odor. Once I debrided the hernia back and  opened up the abdominal wall, small bowel looked fine.  I did not see any  significantly dilated bowel or any ischemic bowel.  There was no exudate.   DESCRIPTION OF PROCEDURE:   Following the induction of general endotracheal  anesthesia, the patient's abdomen was prepped and draped in sterile fashion.  A Foley catheter and nasogastric tube had been placed.  An oblique incision  was made in the right lower quadrant overlying the small palpable mass which  was lateral to the rectus muscle and above the inguinal area.  Dissection  was carried down to the subcutaneous tissue.  The external oblique was  identified and incised in the direction of its fibers very carefully opening  up the space between the internal and external oblique.  In that space I  found the hernia sac which was about 6 cm in size.  I opened up the external  oblique laterally and medially as far as possible.  I then debrided the  hernia sac and ultimately opened the hernia  sac and evacuated some clear  serous fluid which had no odor.  There was a small amount of hemorrhage and  blood clot at the base of the sac.  I trimmed the sac all the way back to  the level of the muscle layer.  I then opened the abdominal wall muscles  medially and laterally and pulled out the small bowel for several feet  proximal and distal.  I never saw a truly dilated small bowel but it was a  little more dilated proximally than distally.  I found no ischemic bowel or  obvious problem.  The internal oblique and transversus abdominis muscle  layers were then closed with interrupted sutures of #1 Novofil.  A 4 inch x  6 inch piece of polypropylene mesh was brought to the operative field and  cut into an appropriate shape and sutured down on top of the internal  oblique with about eight or 10 interrupted sutures of 0 Prolene.  A 19  Jamaica Blake drain was placed on top of the mesh and brought out laterally  in the right flank and sutured to the skin with nylon suture and attached to  a suction bulb.  The external oblique was  closed with a running suture of 0 Vicryl covering up the drain for the most  part.  Skin was closed  with skin staples. Clean bandages were placed and the  patient was taken to the recovery room in stable condition.  The estimated  blood loss was about 30 or 40 mL.  Complications were none.  Sponge, needle  and instrument counts were correct.                                               Angelia Mould. Derrell Lolling, M.D.    HMI/MEDQ  D:  12/03/2003  T:  12/04/2003  Job:  [id number]

## 2011-05-06 NOTE — Discharge Summary (Signed)
NAMEJENETTA, [name]                              ACCOUNT NO.:  [id number]   MEDICAL RECORD NO.:  [id number]                   PATIENT TYPE:  INP   LOCATION:  5736                                 FACILITY:  [facility]   PHYSICIAN:  Angelia Mould. Derrell Lolling, M.D.             DATE OF BIRTH:  1956-09-28   DATE OF ADMISSION:  12/03/2003  DATE OF DISCHARGE:  12/08/2003                                 DISCHARGE SUMMARY   FINAL DIAGNOSES:  1. Incarcerated right Spigelian hernia with acute small-bowel obstruction.  2. Symptomatic gallstones.  3. Status post abdominal hysterectomy and bilateral salpingo-oophorectomy.  4. Borderline hypertension.   OPERATIONS PERFORMED:  Exploration and repair of incarcerated right  Spigelian hernia with Prolene mesh and release of small-bowel obstruction on  December 03, 2003.   HISTORY OF PRESENT ILLNESS:  This is a 55 year old white female who noticed  the sudden onset of right lower quadrant pain at 9 o'clock a.m. the day  prior to admission.  The pain has been getting worse.  She had a small-bowel  movement the day prior to admission but since that time had passed no flatus  or stool.  She has vomited about 10 times.  She states that she had prior  episode of right-sided abdominal pain and previous work-up was not  diagnostic.  She was evaluated in the emergency department by Dr. Carren Rang who performed a CT scan suggesting a bowel obstruction and a hernia.   PHYSICAL EXAMINATION:  GENERAL APPEARANCE:  A pleasant, overweight, middle-  age white female in mild distress.  VITAL SIGNS:  Temperature 99.8, pulse 70, respiratory rate 18, blood  pressure 169/101.  NECK:  No adenopathy, no mass.  LUNGS:  Clear to auscultation.  CARDIOVASCULAR:  Regular rate and rhythm without murmur.  ABDOMEN:  Somewhat obese.  Generally soft, perhaps borderline distended.  Somewhat difficult to tell because of the body habitus.  No skin changes.  In the right lower quadrant there is  a palpable mass which was very tender  and reproduced her pain.  It seemed to be above the inguinal ligament.  There was no evidence of femoral hernia.   ADMISSION LABORATORY DATA:  White blood cell count 11,100, hemoglobin 17.3,  other labs were normal.   The CT scan is as mentioned above.   HOSPITAL COURSE:  On the day of admission, the patient was taken promptly to  the operating room where she underwent exploration of the abdominal wall  mass in the right lower quadrant and I did find that she had an incarcerated  right Spigelian hernia.  The small-bowel had been entrapped in this but  there was ischemia or perforation.  I simply returned the small-bowel to the  abdominal cavity and repaired the hernia primarily and then reinforced the  repair with Prolene mesh.   Postoperatively, she did well.  She was given Lovenox as DVT prophylaxis.  She had an ileus for a couple of days but that resolved and we removed the  nasogastric tube on December 05, 2003, and started a liquid diet.  She  progressed in her activities and diet fairly slowly but steadily.  She was  ready to go home on December 08, 2003.  At  that time, she was ambulatory, tolerating a diet, had had bowel movements,  was feeling well.  Her abdominal wound looked fine.  A Jackson-Pratt drain  had stopped draining and was removed.  She was asked to return to the office  on December 11, 2003, for staple removal.  She was given a prescription for  Vicodin.                                                Angelia Mould. Derrell Lolling, M.D.    HMI/MEDQ  D:  01/05/2004  T:  01/06/2004  Job:  [id number]   cc:   Dineen Kid. Rana Snare, M.D.  [address]  [place]  [place] [address]  Fax: [phone]

## 2011-05-13 ENCOUNTER — Other Ambulatory Visit: Payer: Self-pay | Admitting: *Deleted

## 2011-05-13 MED ORDER — ALPRAZOLAM 0.5 MG PO TABS: ORAL_TABLET | ORAL | Status: DC

## 2011-05-13 NOTE — Telephone Encounter (Signed)
Rx called to [facility]. 

## 2011-07-07 ENCOUNTER — Other Ambulatory Visit: Payer: Self-pay | Admitting: *Deleted

## 2011-07-07 MED ORDER — ALPRAZOLAM 0.5 MG PO TABS: ORAL_TABLET | ORAL | Status: DC

## 2011-07-07 MED ORDER — ESTRADIOL 1 MG PO TABS: 1.0000 mg | ORAL_TABLET | Freq: Every day | ORAL | Status: DC

## 2011-07-07 NOTE — Telephone Encounter (Signed)
Rx called to [facility]. 

## 2011-07-11 ENCOUNTER — Observation Stay (HOSPITAL_COMMUNITY)
Admission: EM | Admit: 2011-07-11 | Discharge: 2011-07-12 | DRG: 313 | Disposition: A | Discharge status: Home / Self Care | Payer: BC Managed Care – PPO | Attending: Cardiovascular Disease | Admitting: Cardiovascular Disease

## 2011-07-11 ENCOUNTER — Emergency Department (HOSPITAL_COMMUNITY): Payer: BC Managed Care – PPO

## 2011-07-11 DIAGNOSIS — F329 Major depressive disorder, single episode, unspecified: Secondary | ICD-10-CM | POA: Insufficient documentation

## 2011-07-11 DIAGNOSIS — R109 Unspecified abdominal pain: Secondary | ICD-10-CM | POA: Diagnosis present

## 2011-07-11 DIAGNOSIS — F411 Generalized anxiety disorder: Secondary | ICD-10-CM | POA: Insufficient documentation

## 2011-07-11 DIAGNOSIS — R079 Chest pain, unspecified: Principal | ICD-10-CM | POA: Diagnosis present

## 2011-07-11 DIAGNOSIS — F3289 Other specified depressive episodes: Secondary | ICD-10-CM | POA: Insufficient documentation

## 2011-07-11 DIAGNOSIS — Z7902 Long term (current) use of antithrombotics/antiplatelets: Secondary | ICD-10-CM | POA: Insufficient documentation

## 2011-07-11 DIAGNOSIS — K8020 Calculus of gallbladder without cholecystitis without obstruction: Secondary | ICD-10-CM | POA: Diagnosis present

## 2011-07-11 DIAGNOSIS — Z79899 Other long term (current) drug therapy: Secondary | ICD-10-CM | POA: Insufficient documentation

## 2011-07-11 DIAGNOSIS — M549 Dorsalgia, unspecified: Secondary | ICD-10-CM | POA: Insufficient documentation

## 2011-07-11 LAB — CK TOTAL AND CKMB (NOT AT ARMC)
CK, MB: 4.9 ng/mL — ABNORMAL HIGH (ref 0.3–4.0)
Relative Index: 3 — ABNORMAL HIGH (ref 0.0–2.5)
Total CK: 164 U/L (ref 7–177)

## 2011-07-11 LAB — PROTIME-INR: Prothrombin Time: 13.7 seconds (ref 11.6–15.2)

## 2011-07-11 LAB — DIFFERENTIAL
Basophils Absolute: 0 10*3/uL (ref 0.0–0.1)
Basophils Relative: 0 % (ref 0–1)
Eosinophils Absolute: 0.1 10*3/uL (ref 0.0–0.7)
Eosinophils Relative: 1 % (ref 0–5)
Lymphocytes Relative: 19 % (ref 12–46)
Lymphs Abs: 1.6 10*3/uL (ref 0.7–4.0)
Monocytes Absolute: 0.5 10*3/uL (ref 0.1–1.0)
Monocytes Relative: 6 % (ref 3–12)
Neutro Abs: 6 10*3/uL (ref 1.7–7.7)
Neutrophils Relative %: 73 % (ref 43–77)

## 2011-07-11 LAB — CBC
HCT: 42.2 % (ref 36.0–46.0)
Hemoglobin: 14.1 g/dL (ref 12.0–15.0)
MCH: 32.1 pg (ref 26.0–34.0)
MCHC: 33.4 g/dL (ref 30.0–36.0)
MCV: 96.1 fL (ref 78.0–100.0)
MCV: 95.2 fL (ref 78.0–100.0)
Platelets: 202 10*3/uL (ref 150–400)
Platelets: 192 10*3/uL (ref 150–400)
RBC: 4.39 MIL/uL (ref 3.87–5.11)
RDW: 12 % (ref 11.5–15.5)
RDW: 12 % (ref 11.5–15.5)
WBC: 8.2 10*3/uL (ref 4.0–10.5)
WBC: 8.2 10*3/uL (ref 4.0–10.5)

## 2011-07-11 LAB — HEPATIC FUNCTION PANEL
ALT: 58 U/L — ABNORMAL HIGH (ref 0–35)
AST: 66 U/L — ABNORMAL HIGH (ref 0–37)
Albumin: 3.5 g/dL (ref 3.5–5.2)
Alkaline Phosphatase: 125 U/L — ABNORMAL HIGH (ref 39–117)
Bilirubin, Direct: <0.1 mg/dL (ref 0.0–0.3)
Total Bilirubin: 0.2 mg/dL — ABNORMAL LOW (ref 0.3–1.2)
Total Protein: 6.8 g/dL (ref 6.0–8.3)

## 2011-07-11 LAB — APTT: aPTT: 81 seconds — ABNORMAL HIGH (ref 24–37)

## 2011-07-11 LAB — BASIC METABOLIC PANEL
CO2: 27 mEq/L (ref 19–32)
CO2: 27 mEq/L (ref 19–32)
Calcium: 8.7 mg/dL (ref 8.4–10.5)
Calcium: 8.1 mg/dL — ABNORMAL LOW (ref 8.4–10.5)
Creatinine, Ser: 0.63 mg/dL (ref 0.50–1.10)
Creatinine, Ser: 0.49 mg/dL — ABNORMAL LOW (ref 0.50–1.10)
GFR calc non Af Amer: >60 mL/min (ref >60)
Glucose, Bld: 125 mg/dL — ABNORMAL HIGH (ref 70–99)

## 2011-07-11 LAB — LIPASE, BLOOD: Lipase: 57 U/L (ref 11–59)

## 2011-07-11 LAB — TROPONIN I: Troponin I: <0.3 ng/mL (ref <0.30)

## 2011-07-11 LAB — LIPID PANEL
Cholesterol: 113 mg/dL (ref 0–200)
LDL Cholesterol: 42 mg/dL (ref 0–99)
VLDL: 36 mg/dL (ref 0–40)

## 2011-07-11 LAB — CARDIAC PANEL(CRET KIN+CKTOT+MB+TROPI)
CK, MB: 4.3 ng/mL — ABNORMAL HIGH (ref 0.3–4.0)
Relative Index: 3.5 — ABNORMAL HIGH (ref 0.0–2.5)
Total CK: 130 U/L (ref 7–177)

## 2011-07-12 ENCOUNTER — Ambulatory Visit (HOSPITAL_COMMUNITY)
Admission: RE | Admit: 2011-07-12 | Discharge: 2011-07-12 | Disposition: A | Discharge status: Home / Self Care | Payer: BC Managed Care – PPO | Source: Ambulatory Visit | Attending: Cardiovascular Disease | Admitting: Cardiovascular Disease

## 2011-07-12 ENCOUNTER — Inpatient Hospital Stay (HOSPITAL_COMMUNITY): Payer: BC Managed Care – PPO

## 2011-07-12 DIAGNOSIS — I10 Essential (primary) hypertension: Secondary | ICD-10-CM | POA: Insufficient documentation

## 2011-07-12 DIAGNOSIS — R079 Chest pain, unspecified: Secondary | ICD-10-CM | POA: Insufficient documentation

## 2011-07-12 LAB — BASIC METABOLIC PANEL
CO2: 28 mEq/L (ref 19–32)
GFR calc non Af Amer: >60 mL/min (ref >60)
Glucose, Bld: 96 mg/dL (ref 70–99)
Potassium: 3.6 mEq/L (ref 3.5–5.1)
Sodium: 139 mEq/L (ref 135–145)

## 2011-07-12 LAB — LIPID PANEL
HDL: 44 mg/dL (ref >39)
LDL Cholesterol: 45 mg/dL (ref 0–99)
Triglycerides: 130 mg/dL (ref <150)
VLDL: 26 mg/dL (ref 0–40)

## 2011-07-12 LAB — CBC
HCT: 42 % (ref 36.0–46.0)
Hemoglobin: 13.8 g/dL (ref 12.0–15.0)
RBC: 4.38 MIL/uL (ref 3.87–5.11)
WBC: 6.3 10*3/uL (ref 4.0–10.5)

## 2011-07-12 MED ORDER — TECHNETIUM TC 99M TETROFOSMIN IV KIT
10.0000 | PACK | Freq: Once | INTRAVENOUS | Status: AC | PRN
  Administered 2011-07-12: 10 via INTRAVENOUS

## 2011-07-12 MED ORDER — TECHNETIUM TC 99M TETROFOSMIN IV KIT
30.0000 | PACK | Freq: Once | INTRAVENOUS | Status: AC | PRN
  Administered 2011-07-12: 30 via INTRAVENOUS

## 2011-07-13 ENCOUNTER — Telehealth: Payer: Self-pay | Admitting: *Deleted

## 2011-07-13 DIAGNOSIS — K8020 Calculus of gallbladder without cholecystitis without obstruction: Secondary | ICD-10-CM | POA: Insufficient documentation

## 2011-07-13 NOTE — Telephone Encounter (Signed)
I will go ahead and do surgeon referral  I reviewed the d/c summary- please send that - is under media tab  Will route to [facility]

## 2011-07-13 NOTE — Telephone Encounter (Signed)
Pt was dignosed with having gallstones while in the hospital and she was told to see a surgeon asap.  She is asking that we refer her to a surgeon in [place].  She says Dr. Alphonsus Sias is aware of this condition, he had called her while she was in the hospital.  She would like to see someone asap.

## 2011-07-13 NOTE — Discharge Summary (Signed)
[name], [name] NO.:  [id number]  MEDICAL RECORD NO.:  [id number]  LOCATION:  1438                         FACILITY:  [facility]  PHYSICIAN:  Ricki Rodriguez, M.D.  DATE OF BIRTH:  Aug 19, 1956  DATE OF ADMISSION:  07/11/2011 DATE OF DISCHARGE:  07/12/2011                              DISCHARGE SUMMARY   The patient admitted by Dr. Orpah Cobb, hospital location [phone].  REFERRING PHYSICIAN:  Karie Schwalbe, MD  FINAL DIAGNOSES: 1. Chest pain. 2. Abdominal pain. 3. Multiple gallbladder stones. 4. Anxiety and depression.  DISCHARGE MEDICATIONS: 1. Calcium carbonate with vitamin D 1-2 tablets twice daily. 2. Estradiol 1 mg daily. 3. Metamucil 1 packet by mouth twice daily as needed. 4. Multivitamin 1 tablet daily. 5. Prilosec 20 mg 1 in the evening. 6. Soy Isoflavones 1 capsule daily. 7. St. John's Wort 300 mg 2 capsules in the morning and 1 capsule in     the evening. 8. Xanax 0.5 mg half to one tablet twice daily as needed for panic     attacks.  DISCHARGE DIET:  Low-sodium heart-healthy diet and the patient to avoid fat intake.  DISCHARGE ACTIVITY:  The patient to increase activity gradually as tolerated.  CONDITION ON DISCHARGE:  Improved.  Follow up by Dr. Orpah Cobb in 2 weeks and by primary care physician in 1 week.  HISTORY:  This 55 year old white female presented with 1-day history of left precordial chest pain, radiating to the back, along with some shortness of breath and nausea.  Some of the pain was increased with inspiration.  The patient used Xanax without much relief.  PHYSICAL EXAMINATION:  VITAL SIGNS:  Temperature 97.4, pulse 71, respiration 20, blood pressure 151/87.  The patient is 5 feet 5 inches and weighs approximately 180 pounds. GENERAL:  The patient is averagely built and well-nourished white female, in no acute distress. HEENT:  The patient is normocephalic, atraumatic with blue eyes. Conjunctivae pink.   Sclerae nonicteric. NECK:  No JVD  and full range of motion. LUNGS:  Clear bilaterally. HEART:  Normal S1 and S2 without murmur, gallop, or rub. ABDOMEN:  Soft and nontender. EXTREMITIES:  No edema, cyanosis, clubbing. SKIN:  Warm and dry. NEUROLOGIC:  The patient moves all 4 extremities.  She is alert and oriented x3 and cranial nerves are grossly intact.  LABORATORY DATA:  Normal WBC count, platelet count, hemoglobin, hematocrit.  Near normal electrolytes, BUN, creatinine; potassium slightly low at 3.3.  CK MB, troponin I, normal.  Chest x-ray, no acute process.  EKG, normal sinus rhythm.  Nuclear stress test negative for reversible ischemia.  HOSPITAL COURSE:  The patient was admitted to tele bed.  Myocardial infarction was ruled out.  She underwent nuclear stress test that failed to show any reversible ischemia.  She also underwent ultrasound of the gallbladder that showed multiple stones.  The patient was advised to get surgical consult for possible gallbladder removal.  She is going to discuss this with her primary care physician and then get things lined up on outpatient basis.     Ricki Rodriguez, M.D.     ASK/MEDQ  D:  07/12/2011  T:  07/13/2011  Job:  [id number]  Electronically Signed by Orpah Cobb M.D. on 07/13/2011 08:48:51 AM

## 2011-07-15 NOTE — Telephone Encounter (Signed)
Appt made wt [facility] Pt has been notified. MK

## 2011-07-20 ENCOUNTER — Ambulatory Visit (INDEPENDENT_AMBULATORY_CARE_PROVIDER_SITE_OTHER): Payer: BC Managed Care – PPO | Admitting: Surgery

## 2011-07-20 ENCOUNTER — Encounter (INDEPENDENT_AMBULATORY_CARE_PROVIDER_SITE_OTHER): Payer: Self-pay | Admitting: Surgery

## 2011-07-20 VITALS — BP 122/83 | HR 70 | Temp 98.1°F | Ht 65.0 in | Wt 180.0 lb

## 2011-07-20 DIAGNOSIS — K8020 Calculus of gallbladder without cholecystitis without obstruction: Secondary | ICD-10-CM

## 2011-07-20 HISTORY — PX: CHOLECYSTECTOMY: SHX55

## 2011-07-20 NOTE — Progress Notes (Signed)
[name] is a 55 y.o. female.    Chief Complaint  Patient presents with  . Abdominal Pain    HPI HPI This is a very pleasant 55 year old female referred to me for evaluation of symptomatic cholelithiasis. She did have a pain in her abdomen for several years after fatty meals. This was felt of anxiety related to recently. She has had nausea and vomiting. The pain is in her epigastrium and moved to the right upper quadrant. It is moderate in intensity. Today she is currently pain free. She denies any jaundice.  Past Medical History  Diagnosis Date  . Depression   . Anxiety   . GERD (gastroesophageal reflux disease)     Past Surgical History  Procedure Date  . Abdominal hysterectomy 08/1998    BSO fibroids  . Hernia repair 11/2003    RIH (ingram)  . Colon surgery     Family History  Problem Relation Age of Onset  . Stroke Father   . Heart disease Brother 52    recent MI    Social History History  Substance Use Topics  . Smoking status: Never Smoker   . Smokeless tobacco: Never Used  . Alcohol Use: No    Allergies  Allergen Reactions  . Eggs Or Egg-Derived Products     REACTION: nausea    Current Outpatient Prescriptions  Medication Sig Dispense Refill  . ALPRAZolam (XANAX) 0.5 MG tablet Take 1/2 - 1 tablet by mouth twice a day.  60 tablet  0  . calcium-vitamin D (OYSTER CALCIUM + D) 500 MG tablet Take 2 tablets by mouth daily       . estradiol (ESTRACE) 1 MG tablet Take 1 tablet (1 mg total) by mouth daily.  30 tablet  7  . omeprazole (PRILOSEC) 20 MG capsule Take 20 mg by mouth daily. As needed for acid reflux symptoms.       Marland Kitchen oxybutynin (DITROPAN-XL) 10 MG 24 hr tablet Take 10 mg by mouth. Daily to reduce urinary frequency       . Soybean-Soy Isoflavones (SOY BALANCE PO) Take by mouth daily.          Review of Systems Review of Systems  Constitutional: Negative.  Negative for fever and chills.  HENT: Negative.   Eyes: Negative.   Respiratory:  Positive for cough and sputum production.   Cardiovascular: Negative.   Gastrointestinal: Positive for nausea, vomiting and abdominal pain.  Genitourinary: Negative.   Musculoskeletal: Negative.   Skin: Negative.   Neurological: Negative.   Endo/Heme/Allergies: Negative.   Psychiatric/Behavioral: Negative.     Physical Exam Physical Exam  Constitutional: She is oriented to person, place, and time. She appears well-developed and well-nourished. No distress.  HENT:  Head: Normocephalic and atraumatic.  Right Ear: External ear normal.  Left Ear: External ear normal.  Nose: Nose normal.  Mouth/Throat: Oropharynx is clear and moist.  Eyes: Conjunctivae and EOM are normal. Pupils are equal, round, and reactive to light. No scleral icterus.  Neck: Normal range of motion. Neck supple. No tracheal deviation present. No thyromegaly present.  Cardiovascular: Normal rate, regular rhythm, normal heart sounds and intact distal pulses.   Respiratory: Effort normal and breath sounds normal. No respiratory distress. She has no wheezes.  GI: Soft. She exhibits no distension and no mass. There is tenderness. There is no rebound and no guarding.       Right upper quad and epigastric mild tenderness  Musculoskeletal: Normal range of motion. She exhibits no edema  and no tenderness.  Lymphadenopathy:    She has no cervical adenopathy.  Neurological: She is alert and oriented to person, place, and time. She has normal reflexes.  Skin: Skin is warm and dry. No rash noted. No erythema.  Psychiatric: Her behavior is normal. Thought content normal.     Blood pressure 122/83, pulse 70, temperature 98.1 F (36.7 C), temperature source Temporal, height 5\' 5"  (1.651 m), weight 180 lb (81.647 kg). Data review: The patient's ultrasound report showed her to have multiple gallstones. The bowel duct was normal in appearance. There is no gallbladder wall thickening and Assessment/Plan  This is a patient with  symptomatic cholelithiasis. At this point laparoscopic cholecystectomy and possible cholangiogram is recommended. I discussed this with her in detail. I discussed the risk of the surgery which include but are not limited to bleeding, infection, bile duct injury, bile leak, injury to other structures, conversion to the procedure, and the chances may not resolve all her symptoms etc. She understands and wishes to proceed. Surgery will be scheduled.Anuj Summons A 07/20/2011, 2:54 PM

## 2011-07-28 ENCOUNTER — Encounter (HOSPITAL_COMMUNITY)
Admission: RE | Admit: 2011-07-28 | Discharge: 2011-07-28 | Disposition: A | Discharge status: Home / Self Care | Payer: BC Managed Care – PPO | Source: Ambulatory Visit | Attending: Surgery | Admitting: Surgery

## 2011-07-28 LAB — COMPREHENSIVE METABOLIC PANEL
Albumin: 3.7 g/dL (ref 3.5–5.2)
Alkaline Phosphatase: 82 U/L (ref 39–117)
BUN: 9 mg/dL (ref 6–23)
Potassium: 4 mEq/L (ref 3.5–5.1)
Total Protein: 7 g/dL (ref 6.0–8.3)

## 2011-07-28 LAB — CBC
HCT: 42.6 % (ref 36.0–46.0)
MCHC: 35 g/dL (ref 30.0–36.0)
RDW: 12.2 % (ref 11.5–15.5)

## 2011-07-28 LAB — SURGICAL PCR SCREEN
MRSA, PCR: NEGATIVE
Staphylococcus aureus: NEGATIVE

## 2011-08-01 ENCOUNTER — Encounter: Payer: Self-pay | Admitting: Internal Medicine

## 2011-08-01 ENCOUNTER — Ambulatory Visit (INDEPENDENT_AMBULATORY_CARE_PROVIDER_SITE_OTHER): Payer: BC Managed Care – PPO | Admitting: Internal Medicine

## 2011-08-01 VITALS — BP 138/80 | HR 77 | Temp 97.8°F | Ht 65.0 in | Wt 177.0 lb

## 2011-08-01 DIAGNOSIS — N8110 Cystocele, unspecified: Secondary | ICD-10-CM

## 2011-08-01 NOTE — Assessment & Plan Note (Signed)
Ongoing discomfort Will set up with gyn counselled almost all of 15 minute visit

## 2011-08-01 NOTE — Progress Notes (Signed)
  Subjective:    Patient ID: [name], female    DOB: 08-29-1956, 55 y.o.   MRN: [id number]  HPI Having her gallbladder out on Friday Has questions about getting pneumovax--was told she needed one  Not sure why (unless they thought she had coronary disease) She will follow routine recommendations---she has not CAD, lung disease or DM  She is concerned about the uterine prolapse Notes it when she is working Uncomfortable and it feels like it is rubbing No bleeding  Reviewed my note from 2009---seemed to have redundant vaginal tissue but no masses  Current Outpatient Prescriptions on File Prior to Visit  Medication Sig Dispense Refill  . ALPRAZolam (XANAX) 0.5 MG tablet Take 1/2 - 1 tablet by mouth twice a day.  60 tablet  0  . calcium-vitamin D (OYSTER CALCIUM + D) 500 MG tablet Take 2 tablets by mouth daily       . estradiol (ESTRACE) 1 MG tablet Take 1 tablet (1 mg total) by mouth daily.  30 tablet  7  . omeprazole (PRILOSEC) 20 MG capsule Take 20 mg by mouth daily. As needed for acid reflux symptoms.       Carolin Coy Isoflavones (SOY BALANCE PO) Take by mouth daily.          Allergies  Allergen Reactions  . Eggs Or Egg-Derived Products     REACTION: nausea    Past Medical History  Diagnosis Date  . Depression   . Anxiety   . GERD (gastroesophageal reflux disease)     Past Surgical History  Procedure Date  . Abdominal hysterectomy 08/1998    BSO fibroids  . Hernia repair 11/2003    RIH (ingram)  . Colon surgery     Family History  Problem Relation Age of Onset  . Stroke Father   . Heart disease Brother 75    recent MI    History   Social History  . Marital Status: Married    Spouse Name: N/A    Number of Children: N/A  . Years of Education: N/A   Occupational History  . Not on file.   Social History Main Topics  . Smoking status: Never Smoker   . Smokeless tobacco: Never Used  . Alcohol Use: No  . Drug Use: Not on file  . Sexually Active:  Not on file   Other Topics Concern  . Not on file   Social History Narrative   Cares for husband with MS      Review of Systems     Objective:   Physical Exam        Assessment & Plan:

## 2011-08-01 NOTE — Patient Instructions (Signed)
Please set up gynecology evaluation

## 2011-08-05 ENCOUNTER — Other Ambulatory Visit (INDEPENDENT_AMBULATORY_CARE_PROVIDER_SITE_OTHER): Payer: Self-pay | Admitting: Surgery

## 2011-08-05 ENCOUNTER — Ambulatory Visit (HOSPITAL_COMMUNITY)
Admission: RE | Admit: 2011-08-05 | Discharge: 2011-08-05 | Disposition: A | Discharge status: Home / Self Care | Payer: BC Managed Care – PPO | Source: Ambulatory Visit | Attending: Surgery | Admitting: Surgery

## 2011-08-05 DIAGNOSIS — K8010 Calculus of gallbladder with chronic cholecystitis without obstruction: Secondary | ICD-10-CM | POA: Insufficient documentation

## 2011-08-05 DIAGNOSIS — Z01812 Encounter for preprocedural laboratory examination: Secondary | ICD-10-CM | POA: Insufficient documentation

## 2011-08-05 DIAGNOSIS — K219 Gastro-esophageal reflux disease without esophagitis: Secondary | ICD-10-CM | POA: Insufficient documentation

## 2011-08-07 NOTE — Op Note (Signed)
  NAMEBARBI, KUMAGAI NO.:  [id number]  MEDICAL RECORD NO.:  [id number]  LOCATION:  [facility]                         FACILITY:  [facility]  PHYSICIAN:  Abigail Miyamoto, M.D. DATE OF BIRTH:  Feb 22, 1956  DATE OF PROCEDURE:  08/05/2011 DATE OF DISCHARGE:                              OPERATIVE REPORT   PREOPERATIVE DIAGNOSIS:  Symptomatic cholelithiasis.  POSTOPERATIVE DIAGNOSIS:  Symptomatic cholelithiasis.  PROCEDURE:  Laparoscopic cholecystectomy.  SURGEON:  Abigail Miyamoto, MD  ANESTHESIA:  General and 0.5% Marcaine.  ESTIMATED BLOOD LOSS:  Minimal.  FINDINGS:  The patient was found to have chronically scarred-appearing gallbladder with gallstones.  PROCEDURE IN DETAIL:  The patient was brought to the operating room, identified as [name].  She was placed supine on the operating room table and general anesthesia was induced.  Her abdomen was then prepped and draped in the usual sterile fashion.  Using #15 blade, a small vertical incision was made below the umbilicus.  This was carried down the fascia which was then opened with scalpel.  A Hemostat was then used to pass the peritoneal cavity under direct vision.  Next, a 0 Vicryl pursestring suture was placed around the fascial opening.  The Hasson port was placed in the opening and insufflation of the abdomen was begun.  A 5-mm port was then placed in the patient's epigastrium and two more were placed in the patient's right upper quadrant, all under direct vision.  The gallbladder was then grasped and tracked above the liver bed.  Several adhesions to the gallbladder were then taken down.  The cystic artery was found to be anterior and was clipped three times proximally, once distally, and transected.  The cystic duct was then easily identified and a critical window was achieved around it.  It was clipped three times proximally, once distally, and transected.  The gallbladder was then slowly dissected  free from liver bed with electrocautery.  Once it was free from liver bed, hemostasis appeared to be achieved with cautery.  The gallbladder was then removed through the fascial incision at the umbilicus.  The 0 Vicryl at the umbilicus was tied in place closing the fascial defect.  I then irrigated abdomen with normal saline and again examined liver bed, hemostasis again was felt to be achieved.  All ports were removed under direct vision, and the abdomen was deflated.  All incisions were anesthetized with Marcaine and closed with 4-0 Monocryl subcuticular sutures.  Steri- Strips and Band-Aids were then applied.  The patient tolerated the procedure well.  All counts were correct at the end of procedure.  The patient was then extubated in the operating room and taken in stable condition to recovery room.     Abigail Miyamoto, M.D.     DB/MEDQ  D:  08/05/2011  T:  08/05/2011  Job:  [id number]  Electronically Signed by Abigail Miyamoto M.D. on 08/07/2011 05:40:42 PM

## 2011-08-12 ENCOUNTER — Encounter (INDEPENDENT_AMBULATORY_CARE_PROVIDER_SITE_OTHER): Payer: Self-pay | Admitting: Surgery

## 2011-08-16 ENCOUNTER — Encounter (INDEPENDENT_AMBULATORY_CARE_PROVIDER_SITE_OTHER): Payer: Self-pay | Admitting: Surgery

## 2011-08-16 ENCOUNTER — Ambulatory Visit (INDEPENDENT_AMBULATORY_CARE_PROVIDER_SITE_OTHER): Payer: BC Managed Care – PPO | Admitting: Surgery

## 2011-08-16 VITALS — BP 136/84 | HR 68

## 2011-08-16 DIAGNOSIS — Z09 Encounter for follow-up examination after completed treatment for conditions other than malignant neoplasm: Secondary | ICD-10-CM

## 2011-08-16 NOTE — Progress Notes (Signed)
Subjective:     Patient ID: [name], female   DOB: February 26, 1956, 55 y.o.   MRN: [id number]  HPI  She is here for her first postoperative visit status post laparoscopic cholecystectomy. She is doing well. She is eating well moving her bowels well. She has no complaints Review of Systems     Objective:   Physical Exam    On exam, her incisions are well healed. The final pathology showed gallstones and chronic inflammation Assessment:     Patient status post laparoscopic cholecystectomy    Plan:     I wrote her a note to return to full activity on September 4. I will see her back as needed

## 2011-08-31 ENCOUNTER — Ambulatory Visit (INDEPENDENT_AMBULATORY_CARE_PROVIDER_SITE_OTHER): Payer: BC Managed Care – PPO | Admitting: Internal Medicine

## 2011-08-31 ENCOUNTER — Encounter: Payer: Self-pay | Admitting: Internal Medicine

## 2011-08-31 DIAGNOSIS — M25569 Pain in unspecified knee: Secondary | ICD-10-CM

## 2011-08-31 DIAGNOSIS — M25562 Pain in left knee: Secondary | ICD-10-CM | POA: Insufficient documentation

## 2011-08-31 NOTE — Assessment & Plan Note (Signed)
I suspect mostly patello femoral source of her pain but ACL laxity is noted and she has mild findings that could indicate arthritis Discussed quad strengthening  Will refer to Dr Patsy Lager

## 2011-08-31 NOTE — Patient Instructions (Addendum)
Please set up appointment with Dr Patsy Lager to evaluate your knee Please set up 15 minute appt for me to treat the warts    Patellofemoral Syndrome If you have had pain in the front of your knee for a long time, chances are good that you have patellofemoral syndrome. The word patella refers to the kneecap. Femoral (or femur) refers to the thigh bone. That is the bone the kneecap sits on. The kneecap is shaped like a triangle. Its job is to protect the knee and to improve the efficiency of your thigh muscles (quadriceps). The underside of the kneecap is made of smooth tissue (cartilage). This lets the kneecap slide up and down as the knee moves. Sometimes this cartilage becomes soft. Your healthcare provider may say the cartilage breaks down. That is patellofemoral syndrome. It can affect one knee, or both. The condition is sometimes called patellofemoral pain syndrome. That is because the condition is painful. The pain usually gets worse with activity. Sitting for a long time with the knee bent also makes the pain worse. It usually gets better with rest and proper treatment. CAUSES No one is sure why some people develop this problem and others do not. Runners often get it. One name for the condition is "runner's knee." However, some people run for years and never have knee pain. Certain things seem to make patellofemoral syndrome more likely. They include:  Moving out of alignment. The kneecap is supposed to move in a straight line when the thigh muscle pulls on it. Sometimes the kneecap moves in poor alignment. That can make the knee swell and hurt. Some experts believe it also wears down the cartilage.   Injury to the kneecap.   Strain on the knee. This may occur during sports activity. Soccer, running, skiing and cycling can put excess stress on the knee.   Being flat-footed or knock-kneed.  SYMPTOMS  Knee pain.   Pain under the kneecap. This is usually a dull, aching pain.   Pain in the knee  when doing certain things: squatting, kneeling, going up or down stairs.   Pain in the knee when you stand up after sitting down for awhile.   Tightness in the knee.   Loss of muscle strength in the thigh.   Swelling of the knee.  DIAGNOSIS Healthcare providers often send people with knee pain to an orthopedic caregiver. This person has special training to treat problems with bones and joints. To decide what is causing your knee pain, your caregiver will probably:  Do a physical exam. This will probably include:   Asking about symptoms you have noticed.   Asking about your activities and any injuries.   Feeling your knee. Moving it. This will help test the knee's strength. It will also check alignment (whether the knee and leg are aligned normally).   Order some tests, such as:   Imaging tests. They create pictures of the inside of the knee. Tests may include:  1. X-rays.  2. Computed tomography (CT) scan. This uses X-rays and a computer to show more detail.  3. Magnetic resonance imaging (MRI). This test uses magnets, radio waves and a computer to make pictures.  TREATMENT  Medication is almost always used first. It can relieve pain. It also can reduce swelling. Non-steroidal anti-inflammatory medicines (called NSAIDs) are usually suggested. Sometimes a stronger form is needed. A stronger form would require a prescription.   Other treatment may be needed after the swelling goes down. Possibilities include:  Exercise. Certain exercises can make the muscles around the knee stronger which decreases the pressure on the knee cap. This includes the thigh muscle. Certain exercises also may be suggested to increase your flexibility.   A knee brace. This gives the knee extra support and helps align the movement of the knee cap.   Orthotics. These are special shoe inserts. They can help keep your leg and knee aligned.   Surgery is sometimes needed. This is rare. Options include:    Arthroscopy. The surgeon uses a special tool to remove any damaged pieces of the kneecap. Only a few small incisions (cuts) are needed.   Realignment. This is open surgery. The goals are to reduce pressure and fix the way the kneecap moves.  PROGNOSIS Most people with patellofemoral syndrome get better without surgery. Medication almost always helps. So does rest. Sometimes changes in exercise may be needed. But, most people can return to their normal activities after the swelling and pain are reduced. HOME CARE INSTRUCTIONS  Take any medication prescribed by your healthcare provider. Follow the directions carefully.   If your knee is swollen:   Put ice or cold packs on it. Do this for 20 to 30 minutes, 3-4 times a day.   Keep the knee raised. Make sure it is supported. Put a pillow under it.   Rest your knee. For example, take the elevator instead of the stairs for awhile. Or, take a break from sports activity that strain your knee. Try walking or swimming instead.   Whenever you are active:   Use an elastic bandage on your knee. This gives it support.   After any activity, put ice or cold packs on your knees. Do this for about 10 to 20 minutes.   Make sure you wear shoes that give good support. Make sure they are not worn down. The heels should not slant in or out.  SEEK MEDICAL CARE IF:  Knee pain gets worse. Or it does not go away, even after taking pain medicine.   Swelling does not go down.   Your thigh muscle becomes weak.   You develop a fever of more than 100.6F (38.1 C).  SEEK IMMEDIATE MEDICAL CARE IF:  You develop a fever of more than 102.0 F (38.9 C).  Document Released: 11/23/2009  [facility] Patient Information 2011 [place], [place].

## 2011-08-31 NOTE — Progress Notes (Signed)
  Subjective:    Patient ID: [name], female    DOB: [birth year], 55 y.o.   MRN: [id number]  HPI Has noticed left knee "going out" in the past couple of months Especially bad by the end of the day--gets sharp pain Hard to stand up when bending down No apparent dislocation No remarkable swelling  Uses OTC "nature's gold" which helps some Did feel warm one day  Uses a brace to remind her to "baby it" Worse on steps  Also has some lesions she wants treated  Current Outpatient Prescriptions on File Prior to Visit  Medication Sig Dispense Refill  . ALPRAZolam (XANAX) 0.5 MG tablet Take 1/2 - 1 tablet by mouth twice a day.  60 tablet  0  . calcium-vitamin D (OYSTER CALCIUM + D) 500 MG tablet Take 2 tablets by mouth daily       . estradiol (ESTRACE) 1 MG tablet Take 1 tablet (1 mg total) by mouth daily.  30 tablet  7  . Soybean-Soy Isoflavones (SOY BALANCE PO) Take by mouth daily.        Satira Sark Johns Wort 300 MG CAPS Take 1 capsule by mouth 3 (three) times daily.          Allergies  Allergen Reactions  . Eggs Or Egg-Derived Products     REACTION: nausea    Past Medical History  Diagnosis Date  . Depression   . Anxiety   . GERD (gastroesophageal reflux disease)     Past Surgical History  Procedure Date  . Abdominal hysterectomy 08/1998    BSO fibroids  . Hernia repair 11/2003    RIH (ingram)  . Colon surgery   . Cholecystectomy 8/12    Family History  Problem Relation Age of Onset  . Stroke Father   . Heart disease Brother 60    recent MI    History   Social History  . Marital Status: Married    Spouse Name: N/A    Number of Children: N/A  . Years of Education: N/A   Occupational History  . Not on file.   Social History Main Topics  . Smoking status: Never Smoker   . Smokeless tobacco: Never Used  . Alcohol Use: No  . Drug Use: No  . Sexually Active: Not on file   Other Topics Concern  . Not on file   Social History Narrative   Cares for husband  with MS   Review of Systems Feels better since gallbladder out No other sig joint issues    Objective:   Physical Exam  Constitutional: She appears well-developed and well-nourished. No distress.  Musculoskeletal:       No knee swelling Mild popping in right knee with passive ROM but no other findings  Left knee has slight crepitus Mild laxity in anterior drawer test compared to right No meniscus findings or other ligament weakness  Neurological:       Gait and strength are normal  Skin:       Warty growth ~59mm at top of right thigh Several other lesions---more like early dermatofibromas--on arms and legs (5 more small lesions)          Assessment & Plan:

## 2011-09-07 ENCOUNTER — Institutional Professional Consult (permissible substitution): Payer: BC Managed Care – PPO | Admitting: Family Medicine

## 2011-09-08 ENCOUNTER — Ambulatory Visit (INDEPENDENT_AMBULATORY_CARE_PROVIDER_SITE_OTHER): Payer: BC Managed Care – PPO | Admitting: Obstetrics & Gynecology

## 2011-09-08 ENCOUNTER — Encounter: Payer: Self-pay | Admitting: Obstetrics & Gynecology

## 2011-09-08 VITALS — BP 140/86 | HR 65 | Ht 65.0 in | Wt 176.0 lb

## 2011-09-08 DIAGNOSIS — N816 Rectocele: Secondary | ICD-10-CM

## 2011-09-08 NOTE — Progress Notes (Signed)
  Subjective:    Patient ID: [name], female    DOB: [birth year], 55 y.o.   MRN: [id number]  HPI 55 yo separated W G2 P2 who is here as a referral from Dr. Alphonsus Sias for evaluation of prolaspe. She has complained of symptoms for the last 3 years, but much worse for the last 6 months. She says this doesn't intefere with voiding, but she does have to use splinting with BMx.She says she is not sexually active.   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 Last pap smear in the 1990s as she had a TAH in 1999 for DUB.    Objective:   Physical Exam  4th degree rectocele 1st degree cystocele Moderate atrophy     Assessment & Plan:  Symptomatic rectocele- I have offered watchful waiting, pessary, or rectocele repair. She prefers the surgical approach, but will need to accrue more PAL before she can schedule surgery.  She will call when she wants this scheduled.

## 2011-09-19 ENCOUNTER — Ambulatory Visit: Payer: BC Managed Care – PPO | Admitting: Internal Medicine

## 2011-10-04 ENCOUNTER — Other Ambulatory Visit: Payer: Self-pay | Admitting: *Deleted

## 2011-10-04 MED ORDER — ALPRAZOLAM 0.5 MG PO TABS: ORAL_TABLET | ORAL | Status: DC

## 2011-10-04 NOTE — Telephone Encounter (Signed)
rx faxed to pharmacy manually  

## 2011-10-04 NOTE — Telephone Encounter (Signed)
Last refill 07/07/2011, Rx in your IN box.

## 2011-10-04 NOTE — Telephone Encounter (Signed)
Okay #[phone] x 0 

## 2011-10-05 ENCOUNTER — Telehealth: Payer: Self-pay | Admitting: *Deleted

## 2011-10-05 ENCOUNTER — Encounter: Payer: Self-pay | Admitting: Gastroenterology

## 2011-10-05 ENCOUNTER — Ambulatory Visit (INDEPENDENT_AMBULATORY_CARE_PROVIDER_SITE_OTHER)
Admission: RE | Admit: 2011-10-05 | Discharge: 2011-10-05 | Disposition: A | Discharge status: Home / Self Care | Payer: BC Managed Care – PPO | Source: Ambulatory Visit | Attending: Family Medicine | Admitting: Family Medicine

## 2011-10-05 ENCOUNTER — Encounter: Payer: Self-pay | Admitting: Family Medicine

## 2011-10-05 ENCOUNTER — Encounter: Payer: Self-pay | Admitting: *Deleted

## 2011-10-05 ENCOUNTER — Ambulatory Visit (INDEPENDENT_AMBULATORY_CARE_PROVIDER_SITE_OTHER): Payer: BC Managed Care – PPO | Admitting: Family Medicine

## 2011-10-05 VITALS — BP 110/70 | HR 68 | Temp 98.0°F | Ht 65.5 in | Wt 176.1 lb

## 2011-10-05 DIAGNOSIS — Z1211 Encounter for screening for malignant neoplasm of colon: Secondary | ICD-10-CM

## 2011-10-05 DIAGNOSIS — M25562 Pain in left knee: Secondary | ICD-10-CM

## 2011-10-05 DIAGNOSIS — M25569 Pain in unspecified knee: Secondary | ICD-10-CM

## 2011-10-05 NOTE — Telephone Encounter (Signed)
Order written

## 2011-10-05 NOTE — Telephone Encounter (Signed)
Pt would like to be referred for colonoscopy.  She wants to go to , this will be her first colonoscopy.

## 2011-10-05 NOTE — Progress Notes (Signed)
Subjective:    Patient ID: [name], female    DOB: [birth year], 55 y.o.   MRN: [id number]  HPI  Dr. Alphonsus Sias requested a consult for evaluation of L knee pain:  Historically, the patient has no significant past history of knee pain in the left knee. She does have a history where she fell and struck the anterior aspect of her left knee approximately 6 months ago. Since then she has occasionally had some pain more in the anterior aspect of her knee. She has some pain with going up and down stairs and with standing. Today, and for the last week, she is effectively not really had any pain. She denies any locking up, mechanical symptoms, or giving way. She did use a knee brace as well as an Ace bandage initially, which helped.  From an occupational standpoint, she is on her knees much of the day and does use the stairs quite a bit  Left knee generally hurting, hurting going down stairs some. ACE bandage helped it a lot. If turning a certain bit, will get a sharp pain. Getting up and down will hurt.   The PMH, PSH, Social History, Family History, Medications, and allergies have been reviewed in [facility], and have been updated if relevant.  Review of Systems REVIEW OF SYSTEMS  GEN: No fevers, chills. Nontoxic. Primarily MSK c/o today. MSK: Detailed in the HPI GI: tolerating PO intake without difficulty Neuro: No numbness, parasthesias, or tingling associated. Otherwise the pertinent positives of the ROS are noted above.      Objective:   Physical Exam   Physical Exam  Blood pressure 110/70, pulse 68, temperature 98 F (36.7 C), temperature source Oral, height 5' 5.5" (1.664 m), weight 176 lb 1.9 oz (79.888 kg), SpO2 97.00%.  GEN: Well-developed,well-nourished,in no acute distress; alert,appropriate and cooperative throughout examination HEENT: Normocephalic and atraumatic without obvious abnormalities. Ears, externally no deformities PULM: Breathing comfortably in no respiratory  distress EXT: No clubbing, cyanosis, or edema PSYCH: Normally interactive. Cooperative during the interview. Pleasant. Friendly and conversant. Not anxious or depressed appearing. Normal, full affect.  Gait: Normal heel toe pattern ROM: 0-125 Effusion: neg Echymosis or edema: none Patellar tendon NT Painful PLICA: neg Patellar grind: minimally positive Medial and lateral patellar facet loading: medial patellar facet mod tender to palpation medial and lateral joint lines:NT Mcmurray's neg Flexion-pinch neg Bounce home neg. Varus and valgus stress: stable Lachman: neg. Firm endpoint. Ant and Post drawer: neg Hip abduction, IR, ER: WNL Hip flexion str: 5/5 Hip abd: 5/5 Quad: 5/5 VMO atrophy:No Hamstring concentric and eccentric: 5/5       Assessment & Plan:   1. Left knee pain  DG Knee Bilateral Standing AP, DG Knee 1-2 Views Left    X-rays: AP Bilateral Weight-bearing, Weightbearing Lateral, Sunrise views Indication: knee pain Findings:  Mild osteoarthritic changes.  >30 minutes spent in face to face time with patient, >50% spent in counselling or coordination of care: Recommendations: I suspect that the patient probably had anterior trauma and some bone bruising initially. At this point she is having clinically some signs of patellofemoral joint irritation, and she may have some true chondromalacia given her age. Overall, though, now she is relatively asymptomatic. I think that she can begin strength training, and we reviewed some strength and balance exercises that she can do at the gym. Recommended that she wear her knee brace initially while going to the gym for stability.  NSAIDS or Tylenol prn if knee is bothering her  Do not suspect internal derangement  I appreciate the opportunity to evaluate this very pleasant patient.

## 2011-10-06 NOTE — Telephone Encounter (Signed)
Colonoscopy appt made at [facility] patient notified.

## 2011-10-14 ENCOUNTER — Ambulatory Visit (AMBULATORY_SURGERY_CENTER): Payer: BC Managed Care – PPO | Admitting: *Deleted

## 2011-10-14 VITALS — Ht 65.0 in | Wt 180.6 lb

## 2011-10-14 DIAGNOSIS — Z1211 Encounter for screening for malignant neoplasm of colon: Secondary | ICD-10-CM

## 2011-10-14 MED ORDER — PEG-KCL-NACL-NASULF-NA ASC-C 100 G PO SOLR: ORAL | Status: DC

## 2011-10-17 ENCOUNTER — Encounter: Payer: Self-pay | Admitting: Gastroenterology

## 2011-10-28 ENCOUNTER — Ambulatory Visit (AMBULATORY_SURGERY_CENTER): Payer: BC Managed Care – PPO | Admitting: Gastroenterology

## 2011-10-28 ENCOUNTER — Encounter: Payer: Self-pay | Admitting: Gastroenterology

## 2011-10-28 VITALS — BP 142/78 | HR 69 | Temp 97.8°F | Resp 25 | Ht 65.0 in | Wt 180.0 lb

## 2011-10-28 DIAGNOSIS — D126 Benign neoplasm of colon, unspecified: Secondary | ICD-10-CM

## 2011-10-28 DIAGNOSIS — Z1211 Encounter for screening for malignant neoplasm of colon: Secondary | ICD-10-CM

## 2011-10-28 DIAGNOSIS — K5730 Diverticulosis of large intestine without perforation or abscess without bleeding: Secondary | ICD-10-CM

## 2011-10-28 MED ORDER — SODIUM CHLORIDE 0.9 % IV SOLN: 500.0000 mL | INTRAVENOUS | Status: DC

## 2011-10-28 NOTE — Patient Instructions (Addendum)
Colon Polyps A polyp is extra tissue that grows inside your body. Colon polyps grow in the large intestine. The large intestine, also called the colon, is part of your digestive system. It is a long, hollow tube at the end of your digestive tract where your body makes and stores stool. Most polyps are not dangerous. They are benign. This means they are not cancerous. But over time, some types of polyps can turn into cancer. Polyps that are smaller than a pea are usually not harmful. But larger polyps could someday become or may already be cancerous. To be safe, doctors remove all polyps and test them.  WHO GETS POLYPS? Anyone can get polyps, but certain people are more likely than others. You may have a greater chance of getting polyps if:  You are over 50.   You have had polyps before.   Someone in your family has had polyps.   Someone in your family has had cancer of the large intestine.   Find out if someone in your family has had polyps. You may also be more likely to get polyps if you:   Eat a lot of fatty foods.   Smoke.   Drink alcohol.   Do not exercise.   Eat too much.  SYMPTOMS  Most small polyps do not cause symptoms. People often do not know they have one until their caregiver finds it during a regular checkup or while testing them for something else. Some people do have symptoms like these:  Bleeding from the anus. You might notice blood on your underwear or on toilet paper after you have had a bowel movement.   Constipation or diarrhea that lasts more than a week.   Blood in the stool. Blood can make stool look black or it can show up as red streaks in the stool.  If you have any of these symptoms, see your caregiver. HOW DOES THE DOCTOR TEST FOR POLYPS? The doctor can use four tests to check for polyps:  Digital rectal exam. The caregiver wears gloves and checks your rectum (the last part of the large intestine) to see if it feels normal. This test would find  polyps only in the rectum. Your caregiver may need to do one of the other tests listed below to find polyps higher up in the intestine.   Barium enema. The caregiver puts a liquid called barium into your rectum before taking x-rays of your large intestine. Barium makes your intestine look white in the pictures. Polyps are dark, so they are easy to see.   Sigmoidoscopy. With this test, the caregiver can see inside your large intestine. A thin flexible tube is placed into your rectum. The device is called a sigmoidoscope, which has a light and a tiny video camera in it. The caregiver uses the sigmoidoscope to look at the last third of your large intestine.   Colonoscopy. This test is like sigmoidoscopy, but the caregiver looks at all of the large intestine. It usually requires sedation. This is the most common method for finding and removing polyps.  TREATMENT   The caregiver will remove the polyp during sigmoidoscopy or colonoscopy. The polyp is then tested for cancer.   If you have had polyps, your caregiver may want you to get tested regularly in the future.  PREVENTION  There is not one sure way to prevent polyps. You might be able to lower your risk of getting them if you:  Eat more fruits and vegetables and less fatty  food.   Do not smoke.   Avoid alcohol.   Exercise every day.   Lose weight if you are overweight.   Eating more calcium and folate can also lower your risk of getting polyps. Some foods that are rich in calcium are milk, cheese, and broccoli. Some foods that are rich in folate are chickpeas, kidney beans, and spinach.   Aspirin might help prevent polyps. Studies are under way.  Document Released: 08/31/2004 Document Revised: 08/17/2011 Document Reviewed: 02/06/2008 [facility] Patient Information 2012 [place], [place]  .Diverticulosis Diverticulosis is a common condition that develops when small pouches (diverticula) form in the wall of the colon. The risk of  diverticulosis increases with age. It happens more often in people who eat a low-fiber diet. Most individuals with diverticulosis have no symptoms. Those individuals with symptoms usually experience abdominal pain, constipation, or loose stools (diarrhea). HOME CARE INSTRUCTIONS   Increase the amount of fiber in your diet as directed by your caregiver or dietician. This may reduce symptoms of diverticulosis.   Your caregiver may recommend taking a dietary fiber supplement.   Drink at least 6 to 8 glasses of water each day to prevent constipation.   Try not to strain when you have a bowel movement.   Your caregiver may recommend avoiding nuts and seeds to prevent complications, although this is still an uncertain benefit.   Only take over-the-counter or prescription medicines for pain, discomfort, or fever as directed by your caregiver.  FOODS WITH HIGH FIBER CONTENT INCLUDE:  Fruits. Apple, peach, pear, tangerine, raisins, prunes.   Vegetables. Brussels sprouts, asparagus, broccoli, cabbage, carrot, cauliflower, romaine lettuce, spinach, summer squash, tomato, winter squash, zucchini.   Starchy Vegetables. Baked beans, kidney beans, lima beans, split peas, lentils, potatoes (with skin).   Grains. Whole wheat bread, brown rice, bran flake cereal, plain oatmeal, white rice, shredded wheat, bran muffins.  SEEK IMMEDIATE MEDICAL CARE IF:   You develop increasing pain or severe bloating.   You have an oral temperature above 102 F (38.9 C), not controlled by medicine.   You develop vomiting or bowel movements that are bloody or black.  Document Released: 09/01/2004 Document Revised: 08/17/2011 Document Reviewed: 05/05/2010 [facility] Patient Information 2012 [place], [place].  Resume your routine medications today.    You will receive the polyp results in the mail within 2 weeks.

## 2011-10-28 NOTE — Progress Notes (Signed)
Pt tolerated the colonoscopy very well. Maw   

## 2011-10-31 ENCOUNTER — Telehealth: Payer: Self-pay

## 2011-10-31 NOTE — Telephone Encounter (Signed)
Follow up Call- Patient questions:  Do you have a fever, pain , or abdominal swelling? no Pain Score  0 *  Have you tolerated food without any problems? yes  Have you been able to return to your normal activities? yes  Do you have any questions about your discharge instructions: Diet   no Medications  no Follow up visit  no  Do you have questions or concerns about your Care? no  Actions: * If pain score is 4 or above: No action needed, pain <4.  This was much better than I thought it would be per the pt. maw

## 2011-11-11 ENCOUNTER — Ambulatory Visit (INDEPENDENT_AMBULATORY_CARE_PROVIDER_SITE_OTHER): Payer: BC Managed Care – PPO | Admitting: Family Medicine

## 2011-11-11 ENCOUNTER — Encounter: Payer: Self-pay | Admitting: Family Medicine

## 2011-11-11 VITALS — BP 110/72 | HR 73 | Temp 98.6°F | Ht 65.0 in | Wt 179.4 lb

## 2011-11-11 DIAGNOSIS — J111 Influenza due to unidentified influenza virus with other respiratory manifestations: Secondary | ICD-10-CM

## 2011-11-11 MED ORDER — HYDROCODONE-HOMATROPINE 5-1.5 MG/5ML PO SYRP: ORAL_SOLUTION | ORAL | Status: AC

## 2011-11-11 NOTE — Progress Notes (Signed)
  Patient Name: [name] Date of Birth: 12/17/1956 Age: 55 y.o. Medical Record Number: [id number] Gender: female  History of Present Illness:  [name] is a 55 y.o. very pleasant female patient who presents with the following:  Coughing really bad, started on Tuesday. Felt a little better the next day. Went home and felt reallly terrible.  Cold chills. Hot sweats. Conitnued with the cough.  Has not been stuffy. Hear a wheezing in her chest.  No history of pulm disease.   She was having very significant arthralgias, myalgias. She was also having some sore throat and some very significant cough. She was sweating, having chills, but never took her temperature.   Past Medical History, Surgical History, Social History, Family History, and Problem List have been reviewed in EHR and updated if relevant.  Review of Systems: ROS: GEN: Acute illness details above GI: Tolerating PO intake GU: maintaining adequate hydration and urination Pulm: No SOB Interactive and getting along well at home.  Otherwise, ROS is as per the HPI.   Physical Examination: Filed Vitals:   11/11/11 1501  BP: 110/72  Pulse: 73  Temp: 98.6 F (37 C)  TempSrc: Oral  Height: 5\' 5"  (1.651 m)  Weight: 179 lb 6.4 oz (81.375 kg)  SpO2: 99%     Gen: WDWN, NAD; A & O x3, cooperative. Pleasant.Globally Non-toxic HEENT: Normocephalic and atraumatic. Throat clear, w/o exudate, R TM clear, L TM - good landmarks, No fluid present. rhinnorhea. No frontal or maxillary sinus T. MMM NECK: Anterior cervical  LAD is present CV: RRR, No M/G/R, cap refill <2 sec PULM: Breathing comfortably in no respiratory distress. no wheezing, crackles, rhonchi EXT: No c/c/e PSYCH: Friendly, good eye contact MSK: Nml gait   Assessment and Plan: The patient's clinical exam and history is consistent with a diagnosis of influenza.  I discussed with the patient that the CDC and [place] Infectious disease personel are  recommended that we not do the rapid influenza screening test on people that have a clinical history and exam consistent with the diagnosis.  Supportive care. Fluids. Cough medicines as needed  Anti-pyretics. Detailed in p/i  Infection control emphasized, including OOW or school until AF 24 hours.

## 2011-11-21 ENCOUNTER — Other Ambulatory Visit: Payer: Self-pay | Admitting: *Deleted

## 2011-11-21 MED ORDER — ALPRAZOLAM 0.5 MG PO TABS: ORAL_TABLET | ORAL | Status: DC

## 2011-11-21 NOTE — Telephone Encounter (Signed)
rx called into pharmacy

## 2011-11-21 NOTE — Telephone Encounter (Signed)
Okay #[phone] x 0 

## 2011-11-25 ENCOUNTER — Ambulatory Visit (INDEPENDENT_AMBULATORY_CARE_PROVIDER_SITE_OTHER): Payer: BC Managed Care – PPO | Admitting: Internal Medicine

## 2011-11-25 ENCOUNTER — Encounter: Payer: Self-pay | Admitting: Internal Medicine

## 2011-11-25 DIAGNOSIS — H6593 Unspecified nonsuppurative otitis media, bilateral: Secondary | ICD-10-CM

## 2011-11-25 DIAGNOSIS — H6590 Unspecified nonsuppurative otitis media, unspecified ear: Secondary | ICD-10-CM

## 2011-11-25 NOTE — Progress Notes (Signed)
  Subjective:    Patient ID: [name], female    DOB: [birth year], 55 y.o.   MRN: [id number]  HPI Having trouble with ears and hearing since recent flu Has to turn up the TV Feels it is causing a headache---tried aleve once No tinnitus or vertigo  Got 3 panic spells during the flu---chest pain, nausea and sweats Generally lasts under 30 minutes Uses the xanax and it may help  Current Outpatient Prescriptions on File Prior to Visit  Medication Sig Dispense Refill  . ALPRAZolam (XANAX) 0.5 MG tablet Take 1/2 - 1 tablet by mouth twice a day.  60 tablet  0  . calcium-vitamin D (OYSTER CALCIUM + D) 500 MG tablet Take 2 tablets by mouth daily       . estradiol (ESTRACE) 1 MG tablet Take 1 tablet (1 mg total) by mouth daily.  30 tablet  7  . Multiple Vitamin (MULTIVITAMIN) capsule Take 1 capsule by mouth daily.        Marland Kitchen omeprazole (PRILOSEC) 20 MG capsule       . St Johns Wort 300 MG CAPS Take 1 capsule by mouth 3 (three) times daily.          Allergies  Allergen Reactions  . Eggs Or Egg-Derived Products     REACTION: nausea    Past Medical History  Diagnosis Date  . Depression   . Anxiety   . GERD (gastroesophageal reflux disease)     Past Surgical History  Procedure Date  . Abdominal hysterectomy 08/1998    BSO fibroids  . Hernia repair 11/2003    RIH (ingram)  . Colon surgery   . Cholecystectomy 8/12    Family History  Problem Relation Age of Onset  . Stroke Father   . Heart disease Brother 15    recent MI  . Colon polyps Neg Hx   . Stomach cancer Neg Hx     History   Social History  . Marital Status: Married    Spouse Name: N/A    Number of Children: N/A  . Years of Education: N/A   Occupational History  . Not on file.   Social History Main Topics  . Smoking status: Never Smoker   . Smokeless tobacco: Never Used  . Alcohol Use: No  . Drug Use: No  . Sexually Active: Not on file   Other Topics Concern  . Not on file   Social History Narrative     Cares for husband with MS   Review of Systems No nasal congestion  Slight cough Doesn't feel sick now     Objective:   Physical Exam  Constitutional: She appears well-developed and well-nourished. No distress.  HENT:  Mouth/Throat: Oropharynx is clear and moist. No oropharyngeal exudate.       No sinus tenderness Mild nasal congestion  Right TM is slightly bulging and pink Left TM is retracted---??fluid (retraction pocket)  Neck: Normal range of motion. Neck supple.  Lymphadenopathy:    She has no cervical adenopathy.          Assessment & Plan:

## 2011-11-25 NOTE — Assessment & Plan Note (Signed)
Since recent flu Some fluid Discussed trial with prednisone---she wishes to defer but will call if not feeling better and wants to try

## 2011-12-28 ENCOUNTER — Telehealth: Payer: Self-pay | Admitting: Internal Medicine

## 2011-12-28 NOTE — Telephone Encounter (Signed)
Patient was seen 11/25/11 and didn't get the flu shot and she wanted to know if she can get it now.

## 2011-12-29 NOTE — Telephone Encounter (Signed)
Yes, she definitely should

## 2011-12-30 NOTE — Telephone Encounter (Signed)
Left message with results, advised pt to call for nurse visit appt

## 2012-01-23 ENCOUNTER — Other Ambulatory Visit: Payer: Self-pay | Admitting: *Deleted

## 2012-01-23 MED ORDER — OMEPRAZOLE 20 MG PO CPDR: 20.0000 mg | DELAYED_RELEASE_CAPSULE | Freq: Every day | ORAL | Status: DC

## 2012-01-27 ENCOUNTER — Other Ambulatory Visit: Payer: Self-pay | Admitting: *Deleted

## 2012-01-27 MED ORDER — ALPRAZOLAM 0.5 MG PO TABS: ORAL_TABLET | ORAL | Status: DC

## 2012-01-27 NOTE — Telephone Encounter (Signed)
rx called into pharmacy

## 2012-02-20 ENCOUNTER — Other Ambulatory Visit: Payer: Self-pay | Admitting: Internal Medicine

## 2012-02-20 DIAGNOSIS — Z1231 Encounter for screening mammogram for malignant neoplasm of breast: Secondary | ICD-10-CM

## 2012-03-12 ENCOUNTER — Ambulatory Visit (HOSPITAL_COMMUNITY)
Admission: RE | Admit: 2012-03-12 | Discharge: 2012-03-12 | Disposition: A | Discharge status: Home / Self Care | Payer: BC Managed Care – PPO | Source: Ambulatory Visit | Attending: Internal Medicine | Admitting: Internal Medicine

## 2012-03-12 DIAGNOSIS — Z1231 Encounter for screening mammogram for malignant neoplasm of breast: Secondary | ICD-10-CM

## 2012-03-14 ENCOUNTER — Encounter: Payer: Self-pay | Admitting: Internal Medicine

## 2012-03-14 ENCOUNTER — Encounter: Payer: Self-pay | Admitting: *Deleted

## 2012-03-14 ENCOUNTER — Ambulatory Visit (INDEPENDENT_AMBULATORY_CARE_PROVIDER_SITE_OTHER): Payer: BC Managed Care – PPO | Admitting: Internal Medicine

## 2012-03-14 VITALS — BP 126/80 | HR 64 | Temp 98.2°F | Ht 66.0 in | Wt 184.0 lb

## 2012-03-14 DIAGNOSIS — K219 Gastro-esophageal reflux disease without esophagitis: Secondary | ICD-10-CM

## 2012-03-14 DIAGNOSIS — Z0000 Encounter for general adult medical examination without abnormal findings: Secondary | ICD-10-CM | POA: Insufficient documentation

## 2012-03-14 DIAGNOSIS — N951 Menopausal and female climacteric states: Secondary | ICD-10-CM

## 2012-03-14 DIAGNOSIS — R5383 Other fatigue: Secondary | ICD-10-CM | POA: Insufficient documentation

## 2012-03-14 DIAGNOSIS — L570 Actinic keratosis: Secondary | ICD-10-CM | POA: Insufficient documentation

## 2012-03-14 DIAGNOSIS — F411 Generalized anxiety disorder: Secondary | ICD-10-CM

## 2012-03-14 DIAGNOSIS — R5381 Other malaise: Secondary | ICD-10-CM

## 2012-03-14 LAB — CBC WITH DIFFERENTIAL/PLATELET
Basophils Absolute: 0 10*3/uL (ref 0.0–0.1)
Lymphocytes Relative: 22.7 % (ref 12.0–46.0)
Lymphs Abs: 1.2 10*3/uL (ref 0.7–4.0)
Monocytes Relative: 8.1 % (ref 3.0–12.0)
Platelets: 219 10*3/uL (ref 150.0–400.0)
RDW: 12.4 % (ref 11.5–14.6)

## 2012-03-14 LAB — BASIC METABOLIC PANEL
BUN: 13 mg/dL (ref 6–23)
Calcium: 8.8 mg/dL (ref 8.4–10.5)
GFR: 91.93 mL/min (ref >60.00)
Glucose, Bld: 101 mg/dL — ABNORMAL HIGH (ref 70–99)

## 2012-03-14 LAB — TSH: TSH: 0.93 u[IU]/mL (ref 0.35–5.50)

## 2012-03-14 LAB — HEPATIC FUNCTION PANEL
Albumin: 3.6 g/dL (ref 3.5–5.2)
Alkaline Phosphatase: 66 U/L (ref 39–117)

## 2012-03-14 NOTE — Assessment & Plan Note (Signed)
Doing generally well Just had mammo UTD on colon Discussed fitness

## 2012-03-14 NOTE — Assessment & Plan Note (Signed)
Better in general Still uses the xanax for sleep

## 2012-03-14 NOTE — Progress Notes (Signed)
Subjective:    Patient ID: [name], female    DOB: [birth year], 56 y.o.   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 ALPRAZolam (XANAX) 0.5 MG tablet Take 1/2 - 1 tablet by mouth twice a day.  60 tablet  0  . calcium-vitamin D (OYSTER CALCIUM + D) 500 MG tablet Take 2 tablets by mouth daily       . estradiol (ESTRACE) 1 MG tablet Take 1 tablet (1 mg total) by mouth daily.  30 tablet  7  . omeprazole (PRILOSEC) 20 MG capsule Take 1 capsule (20 mg total) by mouth daily.  30 capsule  3    Allergies  Allergen Reactions  . Eggs Or Egg-Derived Products     REACTION: nausea    Past Medical History  Diagnosis Date  . Depression   . Anxiety   . GERD (gastroesophageal reflux disease)     Past Surgical History  Procedure Date  . Abdominal hysterectomy 08/1998    BSO fibroids  . Hernia repair 11/2003    RIH (ingram)  . Colon surgery   . Cholecystectomy 8/12    Family History  Problem Relation Age of Onset  . Stroke Father   . Heart disease Brother 78    recent MI  . Colon polyps Neg Hx   . Stomach cancer Neg Hx     History   Social History  . Marital Status: Legally Separated    Spouse Name: N/A    Number of Children: N/A  . Years of Education: N/A   Occupational History  . Housekeeper    Social History Main Topics  . Smoking status: Never Smoker   . Smokeless  tobacco: Never Used  . Alcohol Use: No  . Drug Use: No  . Sexually Active: Not on file   Other Topics Concern  . Not on file   Social History Narrative   Seperated from husband late in 2012. She tries to help him to some extent still (has MS). Financial problems, etc   Review of Systems  Constitutional: Negative for fatigue and unexpected weight change.       Wears seat belt  HENT: Negative for hearing loss, congestion, rhinorrhea, dental problem and tinnitus.        Regular with dentist  Eyes: Negative for visual disturbance.       No diplopia or unilateral vision loss  Respiratory: Negative for cough, chest tightness and shortness of breath.   Cardiovascular: Positive for chest pain. Negative for leg swelling.       Gets pain with "panic attacks"---has lessened and tries not to take xanax (better if she goes outside)  Gastrointestinal: Negative for nausea, vomiting, abdominal pain, constipation and blood in stool.       Heartburn controlled with med  Genitourinary: Negative for dysuria, urgency, difficulty urinating and dyspareunia.  Discussed safe sex  Musculoskeletal: Positive for arthralgias. Negative for back pain and joint swelling.  Skin: Negative for rash.       Has left forehead spot  Neurological: Negative for dizziness, syncope, weakness, light-headedness, numbness and headaches.       Tingling in hands  Hematological: Negative for adenopathy. Does not bruise/bleed easily.  Psychiatric/Behavioral: Negative for sleep disturbance and dysphoric mood. The patient is not nervous/anxious.        Anxiety better since separation Uses xanax regularly at night to help sleep       Objective:   Physical Exam  Constitutional: She is oriented to person, place, and time. She appears well-developed and well-nourished. No distress.  HENT:  Head: Normocephalic and atraumatic.  Right Ear: External ear normal.  Left Ear: External ear normal.  Mouth/Throat: Oropharynx is clear  and moist. No oropharyngeal exudate.       TMs retracted but not inflamed  Eyes: Conjunctivae and EOM are normal. Pupils are equal, round, and reactive to light.  Neck: Normal range of motion. Neck supple. No thyromegaly present.  Cardiovascular: Normal rate, regular rhythm, normal heart sounds and intact distal pulses.  Exam reveals no gallop.   No murmur heard. Pulmonary/Chest: Effort normal and breath sounds normal. No respiratory distress. She has no wheezes. She has no rales.  Abdominal: Soft. There is no tenderness.  Genitourinary:       No breast lesions or masses  Musculoskeletal: She exhibits no edema and no tenderness.  Lymphadenopathy:    She has no cervical adenopathy.    She has no axillary adenopathy.  Neurological: She is alert and oriented to person, place, and time.  Skin: No rash noted.       Small actinic on left forehead  Psychiatric: She has a normal mood and affect. Her behavior is normal. Thought content normal.          Assessment & Plan:

## 2012-03-14 NOTE — Assessment & Plan Note (Signed)
Liquid nitrogen to lesion 40 seconds x 2 Tolerated well Discussed home care

## 2012-03-14 NOTE — Assessment & Plan Note (Signed)
Okay with meds 

## 2012-03-14 NOTE — Assessment & Plan Note (Signed)
controlled with estrogen Will try to slowly wean

## 2012-03-14 NOTE — Assessment & Plan Note (Signed)
Vague with some sensation changes Will check labs

## 2012-03-14 NOTE — Patient Instructions (Signed)
Please call to set up appointment for cortisone shot in left hip if pain continues

## 2012-04-12 ENCOUNTER — Other Ambulatory Visit: Payer: Self-pay | Admitting: *Deleted

## 2012-04-12 MED ORDER — ALPRAZOLAM 0.5 MG PO TABS: ORAL_TABLET | ORAL | Status: DC

## 2012-04-12 NOTE — Telephone Encounter (Signed)
Faxed refill request from [name] is on your desk.  Last filled 01/27/12 for # [phone].

## 2012-04-12 NOTE — Telephone Encounter (Signed)
rx called into pharmacy

## 2012-04-12 NOTE — Telephone Encounter (Signed)
Okay #[phone] x 0 

## 2012-04-12 NOTE — Telephone Encounter (Signed)
Faxed refill request from [facility].  Patient just seen today for OV.

## 2012-04-13 MED ORDER — ESTRADIOL 1 MG PO TABS: 1.0000 mg | ORAL_TABLET | Freq: Every day | ORAL | Status: DC

## 2012-04-13 NOTE — Telephone Encounter (Signed)
Okay to refill #30 x 11 

## 2012-06-05 ENCOUNTER — Other Ambulatory Visit: Payer: Self-pay | Admitting: Internal Medicine

## 2012-09-17 IMAGING — CR DG CHEST 1V PORT
1 series · 1 of 1 positions shown · non-contrast
Comparison: None.

CLINICAL DATA: Chest pain

PORTABLE CHEST - 1 VIEW

[AP]
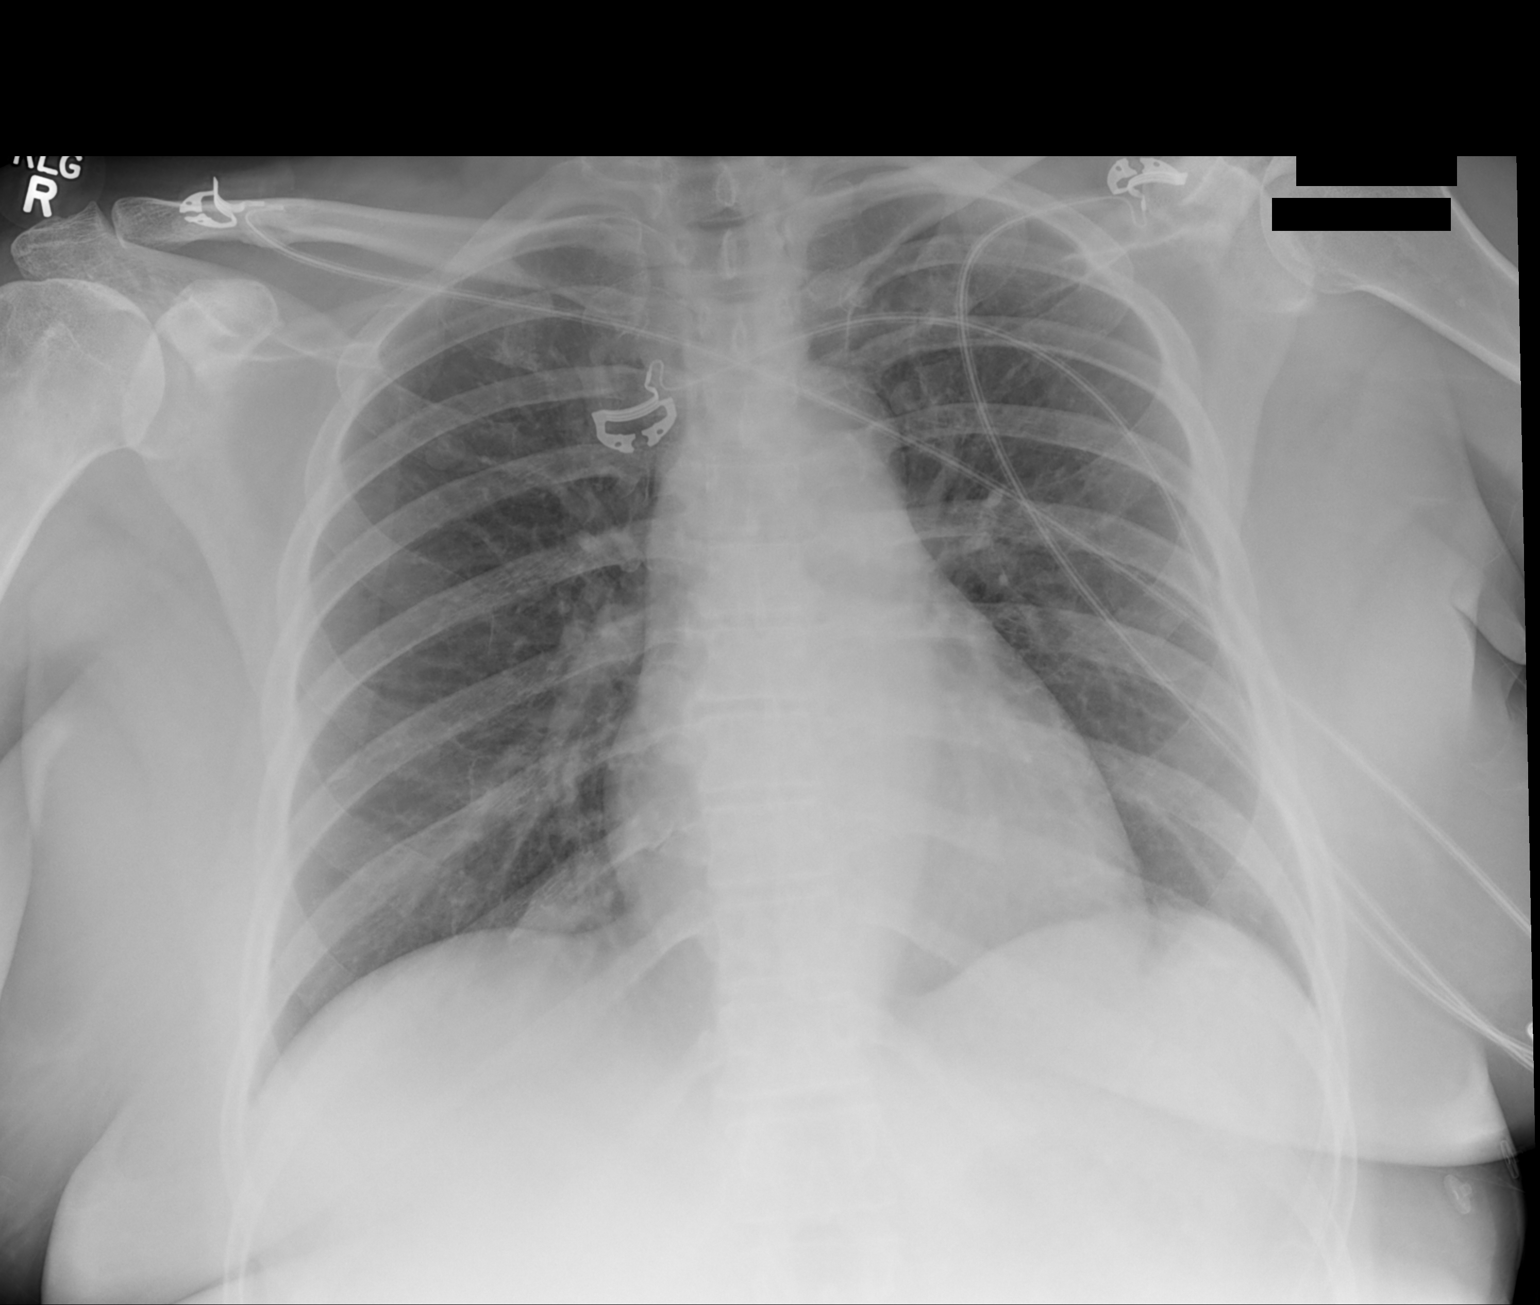

[1 of 1 positions shown; findings below may reference images not displayed]

FINDINGS: Tortuous thoracic aorta.  Lungs clear.  Heart size
normal.  No effusion.  Regional bones unremarkable.
IMPRESSION: 1.  No acute disease

## 2012-09-18 IMAGING — US US ABDOMEN COMPLETE
1 series · 14 of 25 positions shown · non-contrast
Comparison: CT 12/03/2003

There *RADIOLOGY REPORT*
CLINICAL DATA: The back pain.  No gallstones.

COMPLETE ABDOMINAL ULTRASOUND

[Series 1: us abdomen complete · 0.28mm/px · 14 of 71 slices shown]
[im 1/71]
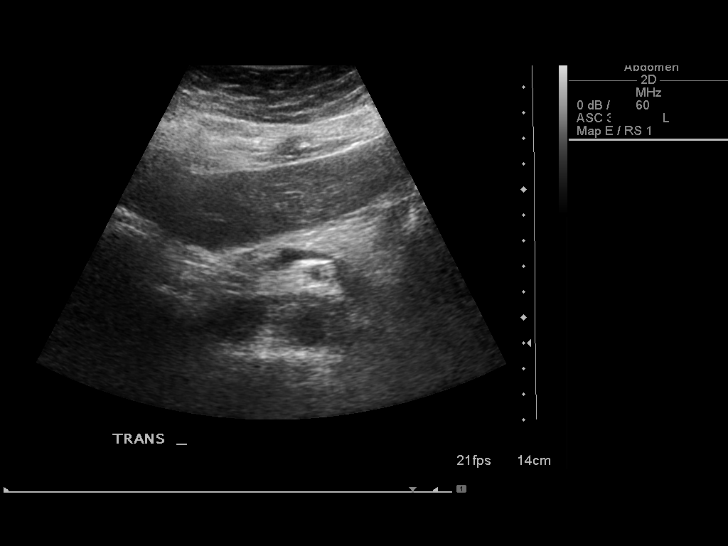
[im 6/71]
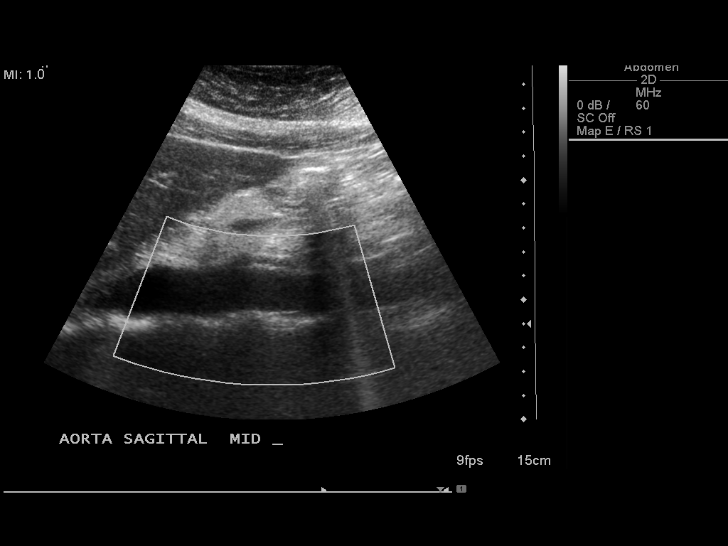
[im 12/71]
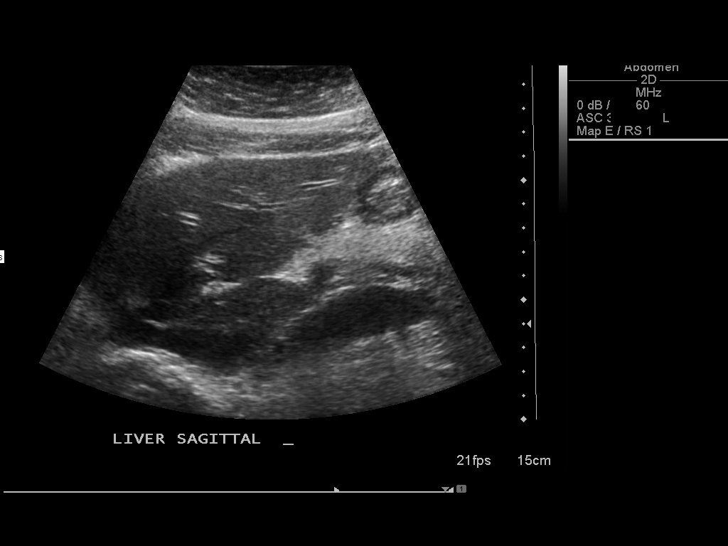
[im 18/71]
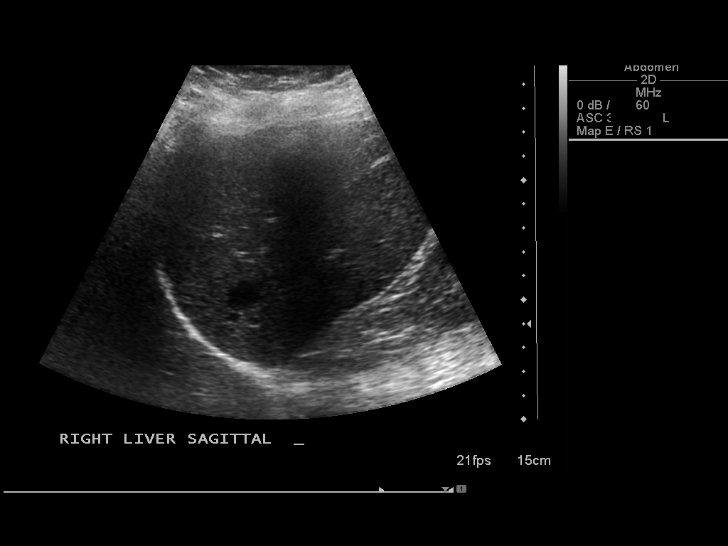
[im 24/71]
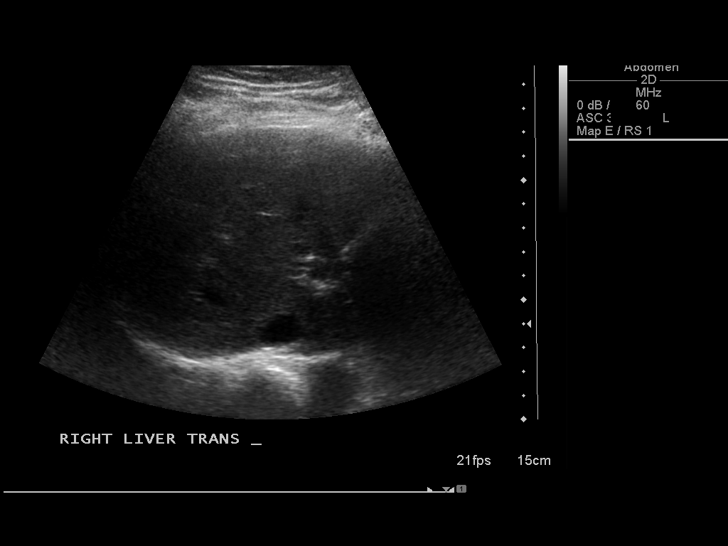
[im 27/71]
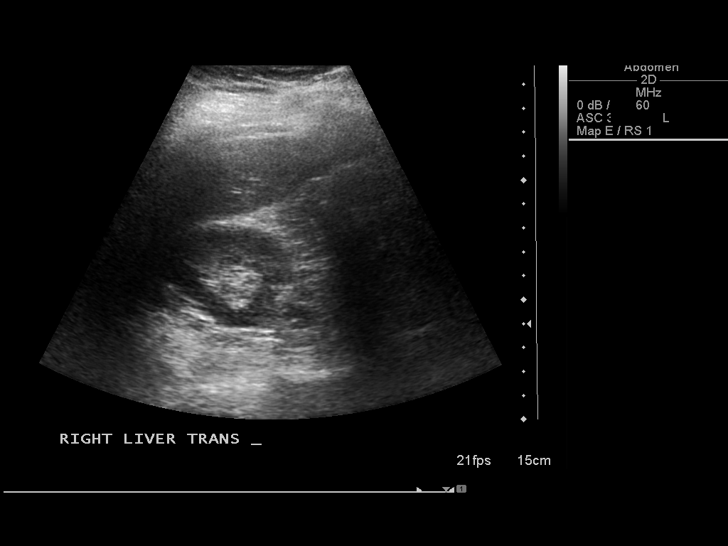
[im 33/71]
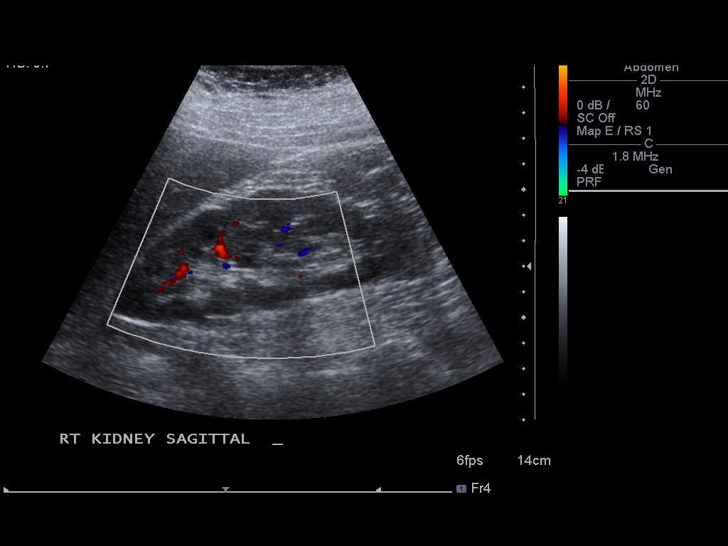
[im 38/71]
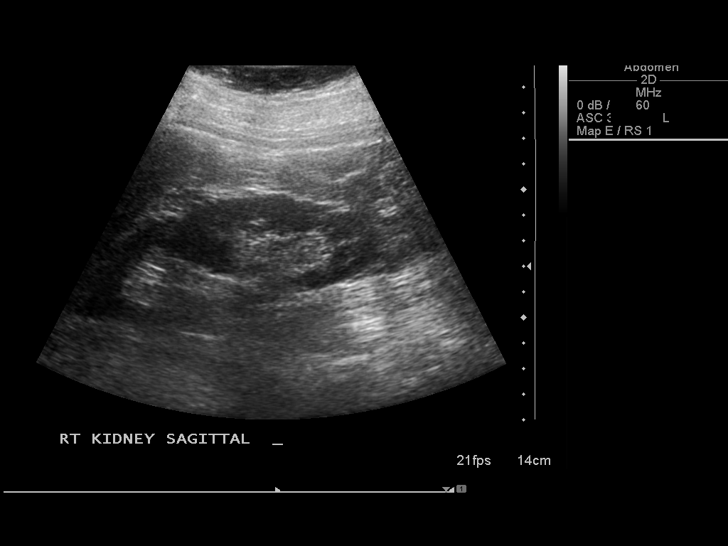
[im 44/71]
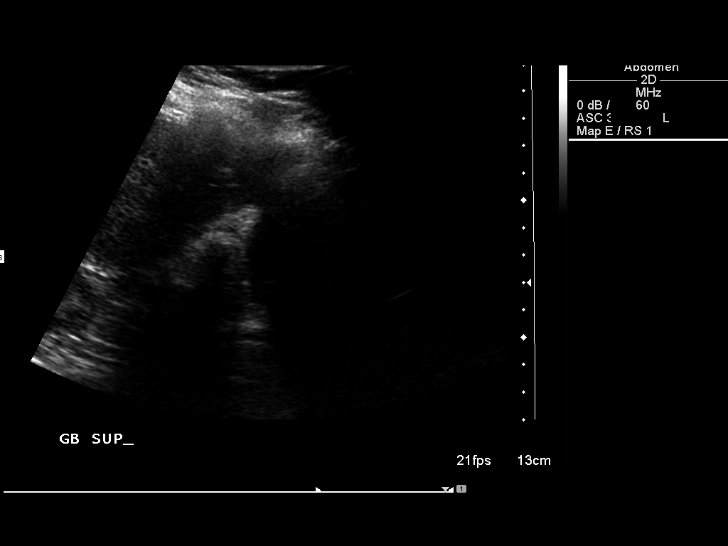
[im 47/71]
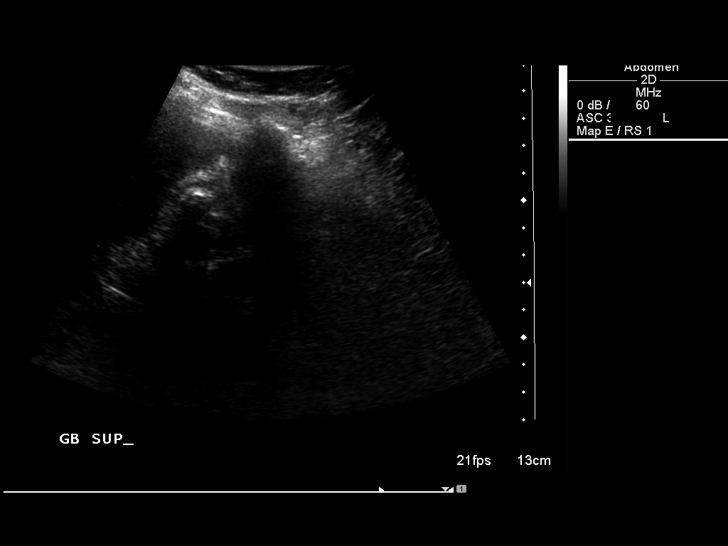
[im 53/71]
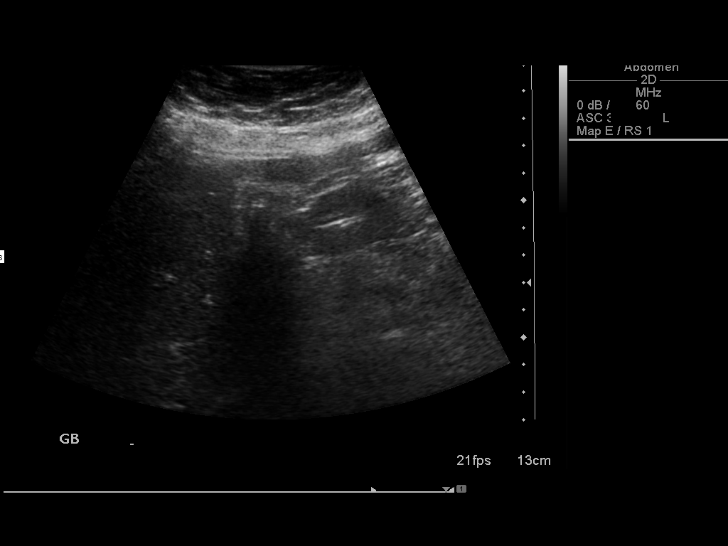
[im 59/71]
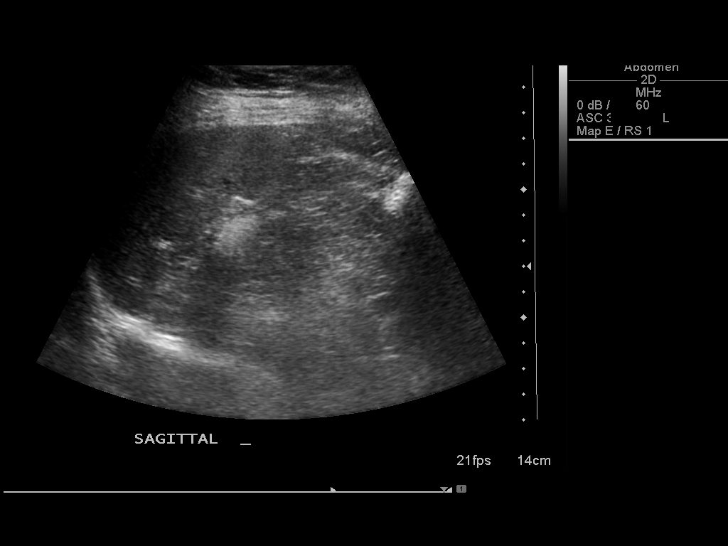
[im 65/71]
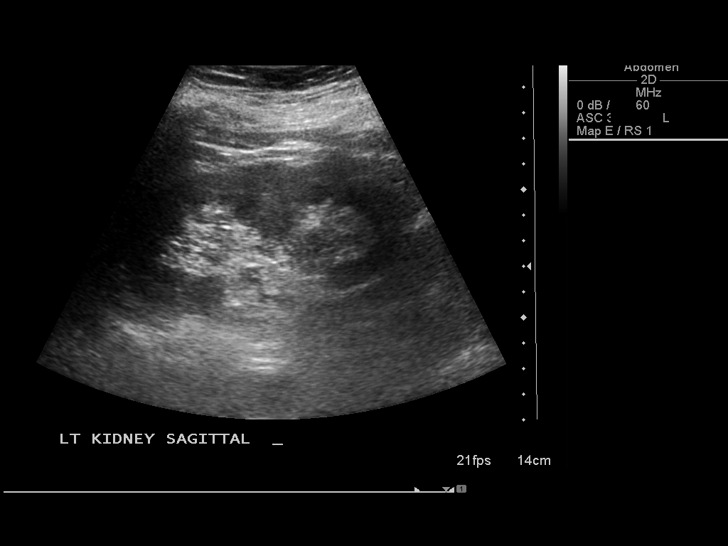
[im 71/71]
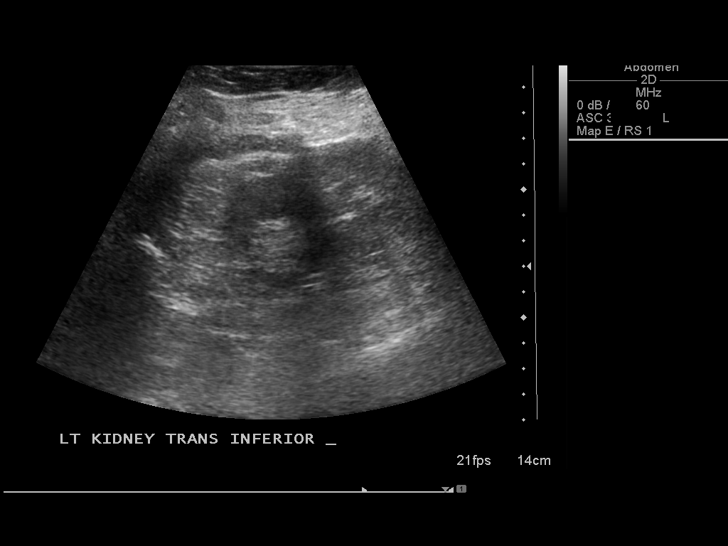

[14 of 25 positions shown; findings below may reference images not displayed]

FINDINGS: Gallbladder:  Multiple gallstones fill the gallbladder.  The
largest measures 11 mm.  No wall thickening or sonographic Murphy's
sign.

Common bile duct:   Normal caliber, 6 mm.

Liver:  No focal lesion identified.  Within normal limits in
parenchymal echogenicity.

IVC:  Appears normal.

Pancreas:  No focal abnormality seen.

Spleen:  Within normal limits in size and echotexture.

Right Kidney:   Normal in size and parenchymal echogenicity.  No
evidence of mass or hydronephrosis.

Left Kidney:  Normal in size and parenchymal echogenicity.  No
evidence of mass or hydronephrosis.

Abdominal aorta:  No aneurysm identified.
IMPRESSION: Cholelithiasis.  No sonographic evidence of acute cholecystitis.

## 2012-10-31 ENCOUNTER — Other Ambulatory Visit: Payer: Self-pay | Admitting: *Deleted

## 2012-10-31 MED ORDER — ALPRAZOLAM 0.5 MG PO TABS: ORAL_TABLET | ORAL | Status: DC

## 2012-10-31 NOTE — Telephone Encounter (Signed)
Okay #[phone] x 0 

## 2012-10-31 NOTE — Telephone Encounter (Signed)
rx called into pharmacy

## 2013-02-01 ENCOUNTER — Ambulatory Visit: Payer: BC Managed Care – PPO | Admitting: Internal Medicine

## 2013-03-05 ENCOUNTER — Other Ambulatory Visit: Payer: Self-pay | Admitting: Internal Medicine

## 2013-03-05 DIAGNOSIS — Z1231 Encounter for screening mammogram for malignant neoplasm of breast: Secondary | ICD-10-CM

## 2013-03-14 ENCOUNTER — Ambulatory Visit (HOSPITAL_COMMUNITY)
Admission: RE | Admit: 2013-03-14 | Discharge: 2013-03-14 | Disposition: A | Discharge status: Home / Self Care | Payer: BC Managed Care – PPO | Source: Ambulatory Visit | Attending: Internal Medicine | Admitting: Internal Medicine

## 2013-03-14 DIAGNOSIS — Z1231 Encounter for screening mammogram for malignant neoplasm of breast: Secondary | ICD-10-CM

## 2013-04-16 ENCOUNTER — Other Ambulatory Visit: Payer: Self-pay | Admitting: *Deleted

## 2013-04-16 MED ORDER — ESTRADIOL 1 MG PO TABS: 1.0000 mg | ORAL_TABLET | Freq: Every day | ORAL | Status: DC

## 2013-04-19 ENCOUNTER — Telehealth: Payer: Self-pay

## 2013-04-19 NOTE — Telephone Encounter (Signed)
Pt said she always gets her CPX in the spring; pt was given CPX 07/11/13 and pt said she wants CPX in spring not the summer because she goes out of town. Pt request call back to see if can get sooner CPX.

## 2013-04-22 NOTE — Telephone Encounter (Signed)
[name] can you help with this appointment?

## 2013-04-23 NOTE — Telephone Encounter (Signed)
I spoke with patient and she scheduled cpx on 05/16/13.  I asked patient to schedule her cpx for next year when she checks out.

## 2013-05-15 ENCOUNTER — Encounter: Payer: Self-pay | Admitting: Radiology

## 2013-05-16 ENCOUNTER — Ambulatory Visit (INDEPENDENT_AMBULATORY_CARE_PROVIDER_SITE_OTHER): Payer: BC Managed Care – PPO | Admitting: Internal Medicine

## 2013-05-16 ENCOUNTER — Encounter: Payer: Self-pay | Admitting: Internal Medicine

## 2013-05-16 VITALS — BP 128/80 | HR 68 | Temp 97.7°F | Wt 185.0 lb

## 2013-05-16 DIAGNOSIS — K219 Gastro-esophageal reflux disease without esophagitis: Secondary | ICD-10-CM

## 2013-05-16 DIAGNOSIS — Z0000 Encounter for general adult medical examination without abnormal findings: Secondary | ICD-10-CM

## 2013-05-16 DIAGNOSIS — L570 Actinic keratosis: Secondary | ICD-10-CM

## 2013-05-16 DIAGNOSIS — J301 Allergic rhinitis due to pollen: Secondary | ICD-10-CM | POA: Insufficient documentation

## 2013-05-16 LAB — CBC WITH DIFFERENTIAL/PLATELET
Basophils Relative: 0.4 % (ref 0.0–3.0)
Eosinophils Absolute: 0.3 10*3/uL (ref 0.0–0.7)
HCT: 42.3 % (ref 36.0–46.0)
Hemoglobin: 14.4 g/dL (ref 12.0–15.0)
Lymphs Abs: 2 10*3/uL (ref 0.7–4.0)
MCHC: 34 g/dL (ref 30.0–36.0)
MCV: 95.8 fl (ref 78.0–100.0)
Monocytes Absolute: 0.7 10*3/uL (ref 0.1–1.0)
Neutro Abs: 4.5 10*3/uL (ref 1.4–7.7)
Neutrophils Relative %: 60.2 % (ref 43.0–77.0)
RBC: 4.42 Mil/uL (ref 3.87–5.11)

## 2013-05-16 LAB — BASIC METABOLIC PANEL
CO2: 28 mEq/L (ref 19–32)
Chloride: 105 mEq/L (ref 96–112)
Creatinine, Ser: 0.6 mg/dL (ref 0.4–1.2)
Potassium: 4 mEq/L (ref 3.5–5.1)

## 2013-05-16 LAB — HEPATIC FUNCTION PANEL
ALT: 10 U/L (ref 0–35)
AST: 14 U/L (ref 0–37)
Albumin: 3.6 g/dL (ref 3.5–5.2)
Total Protein: 7 g/dL (ref 6.0–8.3)

## 2013-05-16 MED ORDER — OMEPRAZOLE 20 MG PO CPDR: 20.0000 mg | DELAYED_RELEASE_CAPSULE | Freq: Every day | ORAL | Status: DC

## 2013-05-16 MED ORDER — ESTRADIOL 1 MG PO TABS: 1.0000 mg | ORAL_TABLET | Freq: Every day | ORAL | Status: DC

## 2013-05-16 NOTE — Assessment & Plan Note (Signed)
Liquid nitrogen 45 seconds x 2 Tolerated well Discussed home care 

## 2013-05-16 NOTE — Progress Notes (Signed)
Subjective:    Patient ID: [name], female    DOB: 14-Dec-1956, 57 y.o.   MRN: [id number]  HPI Here for physical He lives in in-law apt at back of house She still has to help him some  Feels she is no longer allergic to eggs Was able to get flu shot  Trying to diet Some exercise Weight is stable  Now has had some bad allergies Used a claritin and it helped--now bid  Mood has been better No longer using xanax  Current Outpatient Prescriptions on File Prior to Visit  Medication Sig Dispense Refill  . calcium-vitamin D (OYSTER CALCIUM + D) 500 MG tablet Take 2 tablets by mouth daily       . estradiol (ESTRACE) 1 MG tablet Take 1 tablet (1 mg total) by mouth daily. * Needs appointment with Dr for additional refills*  30 tablet  1  . omeprazole (PRILOSEC) 20 MG capsule Take 1 capsule (20 mg total) by mouth daily.  30 capsule  11   No current facility-administered medications on file prior to visit.    No Known Allergies  Past Medical History  Diagnosis Date  . Depression   . Anxiety   . GERD (gastroesophageal reflux disease)   . Allergic rhinitis due to pollen     Past Surgical History  Procedure Laterality Date  . Abdominal hysterectomy  08/1998    BSO fibroids  . Hernia repair  11/2003    RIH (ingram)  . Colon surgery    . Cholecystectomy  8/12    Family History  Problem Relation Age of Onset  . Stroke Father   . Heart disease Brother 11    recent MI  . Colon polyps Neg Hx   . Stomach cancer Neg Hx     History   Social History  . Marital Status: Legally Separated    Spouse Name: N/A    Number of Children: N/A  . Years of Education: N/A   Occupational History  . Housekeeper    Social History Main Topics  . Smoking status: Never Smoker   . Smokeless tobacco: Never Used  . Alcohol Use: No  . Drug Use: No  . Sexually Active: Not on file   Other Topics Concern  . Not on file   Social History Narrative   Seperated from husband late in 2012.  She tries to help him to some extent still (has MS). Financial problems, etc   Review of Systems  Constitutional: Negative for fatigue and unexpected weight change.       Wears seat belt  HENT: Positive for congestion and rhinorrhea. Negative for hearing loss, dental problem and tinnitus.        Regular with dentist  Eyes: Negative for visual disturbance.       No diplopia or unilateral vision loss  Respiratory: Positive for shortness of breath. Negative for cough and chest tightness.        Got some SOB with walking--related it to the allergies  Cardiovascular: Negative for chest pain, palpitations and leg swelling.  Gastrointestinal: Positive for constipation. Negative for nausea, vomiting, abdominal pain and blood in stool.       Heartburn controlled with med Uses citrucel for constipation--this helps  Endocrine: Negative for cold intolerance and heat intolerance.  Genitourinary: Positive for urgency. Negative for dysuria.       Some urgency but not incontinent  Musculoskeletal: Positive for back pain. Negative for joint swelling and arthralgias.  Back is better with regular walking  Skin: Negative for rash.       Brown spot on left hip Reddish spot on left wrist that is irritated  Allergic/Immunologic: Positive for environmental allergies. Negative for immunocompromised state.  Neurological: Positive for numbness and headaches. Negative for dizziness, syncope, weakness and light-headedness.       Occ headaches related to allergies Tingling in right arm---better if she moves it around Some in left hand also  Hematological: Negative for adenopathy. Does not bruise/bleed easily.  Psychiatric/Behavioral: Negative for sleep disturbance and dysphoric mood. The patient is not nervous/anxious.        Objective:   Physical Exam  Constitutional: She is oriented to person, place, and time. She appears well-developed and well-nourished. No distress.  HENT:  Head: Normocephalic and  atraumatic.  Right Ear: External ear normal.  Left Ear: External ear normal.  Mouth/Throat: Oropharynx is clear and moist. No oropharyngeal exudate.  Right TM scarred  Eyes: Conjunctivae and EOM are normal. Pupils are equal, round, and reactive to light.  Neck: Normal range of motion. Neck supple. No thyromegaly present.  Cardiovascular: Normal rate, regular rhythm and intact distal pulses.  Exam reveals no gallop.   Murmur heard. Soft aortic systolic murmur  Pulmonary/Chest: Effort normal and breath sounds normal. No respiratory distress. She has no wheezes. She has no rales.  Abdominal: Soft. There is no tenderness.  Musculoskeletal: She exhibits no edema and no tenderness.  Lymphadenopathy:    She has no cervical adenopathy.  Neurological: She is alert and oriented to person, place, and time.  Skin: No rash noted. No erythema.  AK on extensor left wrist seb keratosis on left hip  Psychiatric: She has a normal mood and affect. Her behavior is normal.          Assessment & Plan:

## 2013-05-16 NOTE — Assessment & Plan Note (Signed)
Discussed OTC Rx 

## 2013-05-16 NOTE — Addendum Note (Signed)
Addended by: Sueanne Margarita on: 05/16/2013 03:01 PM   Modules accepted: Orders

## 2013-05-16 NOTE — Assessment & Plan Note (Signed)
Fairly healthy Working on fitness UTD on cancer screening

## 2013-05-16 NOTE — Assessment & Plan Note (Signed)
Fine on the med

## 2013-06-27 ENCOUNTER — Ambulatory Visit (INDEPENDENT_AMBULATORY_CARE_PROVIDER_SITE_OTHER): Payer: BC Managed Care – PPO | Admitting: Internal Medicine

## 2013-06-27 ENCOUNTER — Encounter: Payer: Self-pay | Admitting: Internal Medicine

## 2013-06-27 VITALS — BP 120/70 | HR 73 | Temp 97.7°F | Wt 187.0 lb

## 2013-06-27 DIAGNOSIS — L570 Actinic keratosis: Secondary | ICD-10-CM

## 2013-06-27 DIAGNOSIS — J301 Allergic rhinitis due to pollen: Secondary | ICD-10-CM

## 2013-06-27 NOTE — Assessment & Plan Note (Signed)
Additional lesion on right arm Liquid nitrogen 40 seconds x 2 Tolerated well

## 2013-06-27 NOTE — Progress Notes (Signed)
  Subjective:    Patient ID: [name], female    DOB: June 24, 1956, 57 y.o.   MRN: [id number]  HPI Has new spots she is concerned about Now worried they may be precancerous  Multiple ticks but no problems with them Has particularly itchy spot on back Itchy spot behind left knee  Concerned-- husband waited on spot then it required very deep excision  Persistent rhinorrhea and sneezing Had been off the claritin Thinks it may be related to egg protein---seems to be better when holding off on her daily egg (she was taking for protein)  Current Outpatient Prescriptions on File Prior to Visit  Medication Sig Dispense Refill  . calcium-vitamin D (OYSTER CALCIUM + D) 500 MG tablet Take 2 tablets by mouth daily       . estradiol (ESTRACE) 1 MG tablet Take 1 tablet (1 mg total) by mouth daily.  30 tablet  11  . loratadine (CLARITIN) 10 MG tablet Take 10 mg by mouth 2 (two) times daily as needed for allergies.      Marland Kitchen omeprazole (PRILOSEC) 20 MG capsule Take 1 capsule (20 mg total) by mouth daily.  30 capsule  11   No current facility-administered medications on file prior to visit.    No Known Allergies  Past Medical History  Diagnosis Date  . Depression   . Anxiety   . GERD (gastroesophageal reflux disease)   . Allergic rhinitis due to pollen     Past Surgical History  Procedure Laterality Date  . Abdominal hysterectomy  08/1998    BSO fibroids  . Hernia repair  11/2003    RIH (ingram)  . Colon surgery    . Cholecystectomy  8/12    Family History  Problem Relation Age of Onset  . Stroke Father   . Heart disease Brother 58    recent MI  . Colon polyps Neg Hx   . Stomach cancer Neg Hx     History   Social History  . Marital Status: Legally Separated    Spouse Name: N/A    Number of Children: N/A  . Years of Education: N/A   Occupational History  . Housekeeper    Social History Main Topics  . Smoking status: Never Smoker   . Smokeless tobacco: Never Used  .  Alcohol Use: No  . Drug Use: No  . Sexually Active: Not on file   Other Topics Concern  . Not on file   Social History Narrative   Seperated from husband late in 2012. She tries to help him to some extent still (has MS). Financial problems, etc   Review of Systems No fever Hasn't been sick     Objective:   Physical Exam  Constitutional: She appears well-developed and well-nourished. No distress.  HENT:  Mild inflammation in nose  Skin:  Various lesions 3mm irregular raised area on right arm  Raised benign lesions on left leg---not concerning Tick bite site on back with central crust and slight irritation Several benign raised areas on left neck and 1 skin tag          Assessment & Plan:

## 2013-06-27 NOTE — Assessment & Plan Note (Signed)
She thinks there may be an additional egg allergy Trying to hold off on eggs now---discussed that she can rechallenge with occasional use  Discussed using the claritin Can add fluticasone if needed

## 2013-06-27 NOTE — Patient Instructions (Signed)
If your nasal congestion and drainage persists, and isn't handled well with the loratadine, call and I can prescribe a nasal cortisone spray

## 2013-07-11 ENCOUNTER — Encounter: Payer: BC Managed Care – PPO | Admitting: Internal Medicine

## 2013-07-23 ENCOUNTER — Ambulatory Visit: Payer: BC Managed Care – PPO | Admitting: Family Medicine

## 2013-08-07 ENCOUNTER — Telehealth: Payer: Self-pay

## 2013-08-07 MED ORDER — FLUTICASONE PROPIONATE 50 MCG/ACT NA SUSP: 2.0000 | Freq: Every day | NASAL | Status: DC

## 2013-08-07 NOTE — Telephone Encounter (Signed)
Left message on machine that rx has been called in 

## 2013-08-07 NOTE — Telephone Encounter (Signed)
Pt seen 06/27/13 and nasal congestion, drainage and runny nose continuing. Pt request nasal cortisone spray Dr Alphonsus Sias mentioned at last visit be sent to [facility].Please advise.

## 2013-08-07 NOTE — Telephone Encounter (Signed)
Please let her know I sent in the prescription

## 2013-10-31 ENCOUNTER — Ambulatory Visit (INDEPENDENT_AMBULATORY_CARE_PROVIDER_SITE_OTHER): Payer: BC Managed Care – PPO | Admitting: Internal Medicine

## 2013-10-31 ENCOUNTER — Encounter: Payer: Self-pay | Admitting: Internal Medicine

## 2013-10-31 VITALS — BP 120/87 | HR 68 | Temp 98.3°F | Wt 188.0 lb

## 2013-10-31 DIAGNOSIS — R030 Elevated blood-pressure reading, without diagnosis of hypertension: Secondary | ICD-10-CM | POA: Insufficient documentation

## 2013-10-31 DIAGNOSIS — N816 Rectocele: Secondary | ICD-10-CM | POA: Insufficient documentation

## 2013-10-31 NOTE — Assessment & Plan Note (Signed)
Very symptomatic still Wants to consider pessary but prefers a different doctor Will refer to Dr D in [place]

## 2013-10-31 NOTE — Assessment & Plan Note (Signed)
BP Readings from Last 3 Encounters:  10/31/13 120/87  06/27/13 120/70  05/16/13 128/80   Repeat 134/90 on right Reassured No action is needed

## 2013-10-31 NOTE — Progress Notes (Signed)
  Subjective:    Patient ID: [name], female    DOB: 1956/02/03, 57 y.o.   MRN: [id number]  HPI Checked BP at [facility] when she got her flu shot Has monitored over the past month Concerned about her strong family history of HTN and stroke  At home, she noted the worst  Was 149/98 Automated arm cuff  Episodic headaches-- 2-3 per month Checked BP then--that is when it was high Feels they were due to stress  No chest pain No SOB Limits caffeine--1 cup of tea in AM  Has noted slowed urination recently Has known prolapsed rectum  Current Outpatient Prescriptions on File Prior to Visit  Medication Sig Dispense Refill  . estradiol (ESTRACE) 1 MG tablet Take 1 tablet (1 mg total) by mouth daily.  30 tablet  11   No current facility-administered medications on file prior to visit.    No Known Allergies  Past Medical History  Diagnosis Date  . Depression   . Anxiety   . GERD (gastroesophageal reflux disease)   . Allergic rhinitis due to pollen     Past Surgical History  Procedure Laterality Date  . Abdominal hysterectomy  08/1998    BSO fibroids  . Hernia repair  11/2003    RIH (ingram)  . Colon surgery    . Cholecystectomy  8/12    Family History  Problem Relation Age of Onset  . Stroke Father   . Heart disease Brother 63    recent MI  . Colon polyps Neg Hx   . Stomach cancer Neg Hx     History   Social History  . Marital Status: Legally Separated    Spouse Name: N/A    Number of Children: N/A  . Years of Education: N/A   Occupational History  . Housekeeper    Social History Main Topics  . Smoking status: Never Smoker   . Smokeless tobacco: Never Used  . Alcohol Use: No  . Drug Use: No  . Sexual Activity: Not on file   Other Topics Concern  . Not on file   Social History Narrative   Seperated from husband late in 2012. She tries to help him to some extent still (has MS). Financial problems, etc   Review of Systems Appetite is fine Weight  stable Sleeps well    Objective:   Physical Exam  Constitutional: She appears well-developed and well-nourished. No distress.  Neck: Normal range of motion. Neck supple. No thyromegaly present.  Cardiovascular: Normal rate, regular rhythm and normal heart sounds.  Exam reveals no gallop.   No murmur heard. Pulmonary/Chest: Effort normal and breath sounds normal. No respiratory distress. She has no wheezes. She has no rales.  Musculoskeletal: She exhibits no edema and no tenderness.  Lymphadenopathy:    She has no cervical adenopathy.          Assessment & Plan:

## 2013-10-31 NOTE — Progress Notes (Signed)
Pre-visit discussion using our clinic review tool. No additional management support is needed unless otherwise documented below in the visit note.  

## 2013-11-11 ENCOUNTER — Telehealth: Payer: Self-pay | Admitting: Family Medicine

## 2013-11-11 ENCOUNTER — Other Ambulatory Visit: Payer: Self-pay | Admitting: Internal Medicine

## 2013-11-11 NOTE — Telephone Encounter (Signed)
noted 

## 2013-11-11 NOTE — Telephone Encounter (Signed)
Pt left vm to let Dr. Alphonsus Sias know that she is no longer taking hormone medication.

## 2013-11-22 ENCOUNTER — Telehealth: Payer: Self-pay

## 2013-11-22 NOTE — Telephone Encounter (Signed)
Tina with Dr DeFrancesco office left v/m that pt called today and cancelled appt 11/28/13.

## 2013-11-22 NOTE — Telephone Encounter (Signed)
Please check to see what happened

## 2013-11-26 NOTE — Telephone Encounter (Signed)
If she doesn't want to consider the pessary, okay

## 2013-11-26 NOTE — Telephone Encounter (Signed)
Called the patient to ask why she cancelled her Gyn appt and she said because she didn't think she needed to go, that she doesn't think she has a problem.

## 2014-01-23 ENCOUNTER — Other Ambulatory Visit: Payer: Self-pay

## 2014-01-23 MED ORDER — OMEPRAZOLE 20 MG PO CPDR: 20.0000 mg | DELAYED_RELEASE_CAPSULE | Freq: Every day | ORAL | Status: DC

## 2014-02-18 ENCOUNTER — Other Ambulatory Visit: Payer: Self-pay | Admitting: Internal Medicine

## 2014-02-19 NOTE — Telephone Encounter (Signed)
rx called into pharmacy

## 2014-02-19 NOTE — Telephone Encounter (Signed)
Okay #[phone] x 0 

## 2014-02-19 NOTE — Telephone Encounter (Signed)
10/2012 

## 2014-03-21 ENCOUNTER — Other Ambulatory Visit: Payer: Self-pay | Admitting: Internal Medicine

## 2014-03-24 ENCOUNTER — Other Ambulatory Visit: Payer: Self-pay | Admitting: Internal Medicine

## 2014-03-24 NOTE — Telephone Encounter (Signed)
rx called into pharmacy

## 2014-03-24 NOTE — Telephone Encounter (Signed)
02/19/14 

## 2014-03-24 NOTE — Telephone Encounter (Signed)
Okay #[phone] x 0 

## 2014-04-21 ENCOUNTER — Other Ambulatory Visit: Payer: Self-pay | Admitting: Internal Medicine

## 2014-04-21 NOTE — Telephone Encounter (Signed)
03/24/14 

## 2014-04-21 NOTE — Telephone Encounter (Signed)
Okay #[phone] x 0 

## 2014-04-22 NOTE — Telephone Encounter (Signed)
rx called into pharmacy

## 2014-05-14 ENCOUNTER — Encounter: Payer: Self-pay | Admitting: Internal Medicine

## 2014-05-14 ENCOUNTER — Ambulatory Visit (INDEPENDENT_AMBULATORY_CARE_PROVIDER_SITE_OTHER): Payer: BC Managed Care – PPO | Admitting: Internal Medicine

## 2014-05-14 VITALS — BP 110/70 | HR 75 | Temp 98.4°F | Wt 183.0 lb

## 2014-05-14 DIAGNOSIS — H9190 Unspecified hearing loss, unspecified ear: Secondary | ICD-10-CM

## 2014-05-14 NOTE — Progress Notes (Signed)
Pre visit review using our clinic review tool, if applicable. No additional management support is needed unless otherwise documented below in the visit note. 

## 2014-05-14 NOTE — Progress Notes (Signed)
   Subjective:    Patient ID: [name], female    DOB: 07-13-1956, 58 y.o.   MRN: [id number]  HPI Having problems with right ear Not hearing well Gets "an acoustic" with her speech Some headaches-- will improve with cup of tea  Blowing nose a lot--actually popped her ears last night and helped the ears a little Ran out of flonase--just bought more yesterday Seemed to help Doesn't remember regular allergy problems but did have problems last year also Did use a leaf blower this weekend  Current Outpatient Prescriptions on File Prior to Visit  Medication Sig Dispense Refill  . ALPRAZolam (XANAX) 0.5 MG tablet TAKE 1/2 TO 1 TABLET BY MOUTH TWICE A DAY AS NEEDED.  60 tablet  0  . Calcium Carb-Cholecalciferol (CALCIUM 600 + D) 600-200 MG-UNIT TABS Take 3 tablets by mouth daily.      Marland Kitchen omeprazole (PRILOSEC) 20 MG capsule TAKE ONE (1) CAPSULE BY MOUTH EACH DAY  90 capsule  3   No current facility-administered medications on file prior to visit.    No Known Allergies  Past Medical History  Diagnosis Date  . Depression   . Anxiety   . GERD (gastroesophageal reflux disease)   . Allergic rhinitis due to pollen     Past Surgical History  Procedure Laterality Date  . Abdominal hysterectomy  08/1998    BSO fibroids  . Hernia repair  11/2003    RIH (ingram)  . Colon surgery    . Cholecystectomy  8/12    Family History  Problem Relation Age of Onset  . Stroke Father   . Heart disease Brother 66    recent MI  . Colon polyps Neg Hx   . Stomach cancer Neg Hx     History   Social History  . Marital Status: Legally Separated    Spouse Name: N/A    Number of Children: N/A  . Years of Education: N/A   Occupational History  . Housekeeper    Social History Main Topics  . Smoking status: Never Smoker   . Smokeless tobacco: Never Used  . Alcohol Use: No  . Drug Use: No  . Sexual Activity: Not on file   Other Topics Concern  . Not on file   Social History Narrative   Seperated from husband late in 2012. She tries to help him to some extent still (has MS). Financial problems, etc   Review of Systems No fever No cough Has been sleepy lately---no antihistamines    Objective:   Physical Exam  HENT:  Mouth/Throat: Oropharynx is clear and moist. No oropharyngeal exudate.  Mild pale nasal congestion Left TM normal Right TM retracted and slightly red--not really inflamed  Neck: Normal range of motion. Neck supple.  Lymphadenopathy:    She has no cervical adenopathy.          Assessment & Plan:

## 2014-05-14 NOTE — Assessment & Plan Note (Signed)
Chronic on right side Some improvement since restarting the flonase---probably some ET dysfunction She will continue and we can check her hearing next week when she is in for physical No signs of infection

## 2014-05-20 ENCOUNTER — Encounter: Payer: BC Managed Care – PPO | Admitting: Internal Medicine

## 2014-05-21 ENCOUNTER — Encounter: Payer: Self-pay | Admitting: Internal Medicine

## 2014-05-21 ENCOUNTER — Ambulatory Visit (INDEPENDENT_AMBULATORY_CARE_PROVIDER_SITE_OTHER): Payer: BC Managed Care – PPO | Admitting: Internal Medicine

## 2014-05-21 VITALS — BP 128/80 | HR 70 | Temp 98.2°F | Ht 66.0 in | Wt 181.0 lb

## 2014-05-21 DIAGNOSIS — K219 Gastro-esophageal reflux disease without esophagitis: Secondary | ICD-10-CM

## 2014-05-21 DIAGNOSIS — N951 Menopausal and female climacteric states: Secondary | ICD-10-CM

## 2014-05-21 DIAGNOSIS — Z0000 Encounter for general adult medical examination without abnormal findings: Secondary | ICD-10-CM

## 2014-05-21 DIAGNOSIS — Z23 Encounter for immunization: Secondary | ICD-10-CM

## 2014-05-21 NOTE — Patient Instructions (Signed)
Please set up your screening mammogram.  Try taking only 1/2 tab of the estrogen once a week. If you tolerate that, you can try to decrease it slowly on other days as well.

## 2014-05-21 NOTE — Progress Notes (Signed)
Subjective:    Patient ID: [name], female    DOB: 1956-02-01, 58 y.o.   MRN: [id number]  HPI Here for physical Hearing got better-did seem to be allergies  Has had several tick bites May have been on for 24 hours--?[place] Not sick, no fever, no rash  Back together with her husband They still don't stay together in same room---he is still in the apartment--but not officially separated anymore  Still on the estrogen Has tried to substitute soy, or skip dose for 1 day---but bad hormonal symptoms and sweats when she tries this She does do self breast exam and no problems  Current Outpatient Prescriptions on File Prior to Visit  Medication Sig Dispense Refill  . Calcium Carb-Cholecalciferol (CALCIUM 600 + D) 600-200 MG-UNIT TABS Take 3 tablets by mouth daily.      Marland Kitchen omeprazole (PRILOSEC) 20 MG capsule TAKE ONE (1) CAPSULE BY MOUTH EACH DAY  90 capsule  3   No current facility-administered medications on file prior to visit.    No Known Allergies  Past Medical History  Diagnosis Date  . Depression   . Anxiety   . GERD (gastroesophageal reflux disease)   . Allergic rhinitis due to pollen     Past Surgical History  Procedure Laterality Date  . Abdominal hysterectomy  08/1998    BSO fibroids  . Hernia repair  11/2003    RIH (ingram)  . Colon surgery    . Cholecystectomy  8/12    Family History  Problem Relation Age of Onset  . Stroke Father   . Heart disease Brother 71    recent MI  . Colon polyps Neg Hx   . Stomach cancer Neg Hx     History   Social History  . Marital Status: Married    Spouse Name: N/A    Number of Children: N/A  . Years of Education: N/A   Occupational History  . Housekeeper    Social History Main Topics  . Smoking status: Never Smoker   . Smokeless tobacco: Never Used  . Alcohol Use: No  . Drug Use: No  . Sexual Activity: Not on file   Other Topics Concern  . Not on file   Social History  Narrative   Seperated from husband late in 2012. She tries to help him to some extent still (has MS). Financial problems, etc   Review of Systems  Constitutional: Negative for fatigue and unexpected weight change.       Wears seat belt Using "Almaseed" diet plan---shakes and then moderated diet. Feels it gives her more energy  HENT: Negative for dental problem, hearing loss and tinnitus.        Regular wit dentist  Eyes: Negative for visual disturbance.       No diplopia or unilateral vision loss  Respiratory: Negative for cough, chest tightness and shortness of breath.   Cardiovascular: Negative for chest pain, palpitations and leg swelling.  Gastrointestinal: Negative for nausea, vomiting, abdominal pain, constipation and blood in stool.       No heartburn on the med  Endocrine: Negative for cold intolerance and heat intolerance.  Genitourinary: Positive for urgency and difficulty urinating. Negative for dyspareunia.       Slow stream and some dribbling No incontinence   Musculoskeletal: Positive for arthralgias and back pain. Negative for joint swelling.       Knees and back----"nature's gold" really helps  Skin: Negative for rash.  No suspicious lesions  Allergic/Immunologic: Positive for environmental allergies. Negative for immunocompromised state.  Neurological: Positive for headaches. Negative for dizziness, syncope, weakness, light-headedness and numbness.       Some sinus tenderness  Hematological: Negative for adenopathy. Does not bruise/bleed easily.  Psychiatric/Behavioral: Negative for sleep disturbance and dysphoric mood. The patient is not nervous/anxious.        Objective:   Physical Exam  Constitutional: She is oriented to person, place, and time. She appears well-developed and well-nourished. No distress.  HENT:  Head: Normocephalic and atraumatic.  Right Ear: External ear normal.  Left Ear: External ear normal.  Mouth/Throat: Oropharynx is clear and  moist. No oropharyngeal exudate.  Eyes: Conjunctivae and EOM are normal. Pupils are equal, round, and reactive to light.  Neck: Normal range of motion. Neck supple. No thyromegaly present.  Cardiovascular: Normal rate, regular rhythm, normal heart sounds and intact distal pulses.  Exam reveals no gallop.   No murmur heard. Pulmonary/Chest: Effort normal and breath sounds normal. No respiratory distress. She has no wheezes. She has no rales.  Abdominal: Soft. There is no tenderness.  Genitourinary:  No breast masses or tenderness  Musculoskeletal: She exhibits no edema and no tenderness.  Lymphadenopathy:    She has no cervical adenopathy.    She has no axillary adenopathy.  Neurological: She is alert and oriented to person, place, and time.  Skin: No rash noted. No erythema.  2 mildly inflamed tick bite spots--no infection or retained parts (right flank and leg)  Psychiatric: She has a normal mood and affect. Her behavior is normal.          Assessment & Plan:

## 2014-05-21 NOTE — Assessment & Plan Note (Signed)
Fine on the med Will hold off on labs this year after discussion

## 2014-05-21 NOTE — Assessment & Plan Note (Signed)
Will try to find the lowest effective dose

## 2014-05-21 NOTE — Progress Notes (Signed)
Pre visit review using our clinic review tool, if applicable. No additional management support is needed unless otherwise documented below in the visit note. 

## 2014-05-21 NOTE — Assessment & Plan Note (Signed)
Fairly healthy Working on fitness  Tdap today Yearly mammo due to estrogen use

## 2014-05-21 NOTE — Addendum Note (Signed)
Addended by: Despina Hidden on: 05/21/2014 01:12 PM   Modules accepted: Orders

## 2014-06-18 ENCOUNTER — Other Ambulatory Visit: Payer: Self-pay | Admitting: Internal Medicine

## 2014-08-08 ENCOUNTER — Encounter: Payer: Self-pay | Admitting: Internal Medicine

## 2014-08-08 ENCOUNTER — Ambulatory Visit (INDEPENDENT_AMBULATORY_CARE_PROVIDER_SITE_OTHER): Payer: BC Managed Care – PPO | Admitting: Internal Medicine

## 2014-08-08 ENCOUNTER — Encounter (INDEPENDENT_AMBULATORY_CARE_PROVIDER_SITE_OTHER): Payer: Self-pay

## 2014-08-08 VITALS — BP 100/70 | HR 72 | Temp 98.3°F | Wt 181.0 lb

## 2014-08-08 DIAGNOSIS — J029 Acute pharyngitis, unspecified: Secondary | ICD-10-CM | POA: Insufficient documentation

## 2014-08-08 NOTE — Progress Notes (Signed)
Pre visit review using our clinic review tool, if applicable. No additional management support is needed unless otherwise documented below in the visit note. 

## 2014-08-08 NOTE — Assessment & Plan Note (Signed)
The presentation, esp with the ulceration, is classic for viral process. Discussed symptomatic Rx (she doesn't need lidocaine or mouthwash) Reassured--not strep

## 2014-08-08 NOTE — Progress Notes (Signed)
   Subjective:    Patient ID: [name], female    DOB: [birth year], 58 y.o.   MRN: [id number]  HPI Has a sore throat on her right Neck is sore on outside also Worried about strep Noticed some sores on tongue Feels the pain with swallowing--but able to. Feels good with swallowing cool liquids  Started 4-5 days ago No fever No cough No SOB Some ear pain on right No sig head congestion or drainage  Current Outpatient Prescriptions on File Prior to Visit  Medication Sig Dispense Refill  . Calcium Carb-Cholecalciferol (CALCIUM 600 + D) 600-200 MG-UNIT TABS Take 3 tablets by mouth daily.      Marland Kitchen estradiol (ESTRACE) 1 MG tablet TAKE ONE (1) TABLET BY MOUTH EVERY DAY  30 tablet  11  . fluticasone (FLONASE) 50 MCG/ACT nasal spray Place 2 sprays into both nostrils daily.       Marland Kitchen omeprazole (PRILOSEC) 20 MG capsule TAKE ONE (1) CAPSULE BY MOUTH EACH DAY  90 capsule  3  . psyllium (METAMUCIL) 58.6 % powder Take 1 packet by mouth daily.       No current facility-administered medications on file prior to visit.    No Known Allergies  Past Medical History  Diagnosis Date  . Depression   . Anxiety   . GERD (gastroesophageal reflux disease)   . Allergic rhinitis due to pollen     Past Surgical History  Procedure Laterality Date  . Abdominal hysterectomy  08/1998    BSO fibroids  . Hernia repair  11/2003    RIH (ingram)  . Colon surgery    . Cholecystectomy  8/12    Family History  Problem Relation Age of Onset  . Stroke Father   . Heart disease Brother 67    recent MI  . Colon polyps Neg Hx   . Stomach cancer Neg Hx     History   Social History  . Marital Status: Married    Spouse Name: N/A    Number of Children: N/A  . Years of Education: N/A   Occupational History  . Housekeeper    Social History Main Topics  . Smoking status: Never Smoker   . Smokeless tobacco: Never Used  . Alcohol Use: No  . Drug Use: No  . Sexual Activity: Not on file   Other Topics  Concern  . Not on file   Social History Narrative  . No narrative on file   Review of Systems Stopped the hormones after last visit---getting 3-4 hot flashes a day (mostly in evening and night) No rash No diarrhea or vomiting     Objective:   Physical Exam  Constitutional: She appears well-developed and well-nourished. No distress.  HENT:  Slight inflammation along very bottom of right TM--doesn't have serous changes Pharynx injected --mostly on right. 39mm ulcer on right tonsillar pillar  Neck: Normal range of motion. Neck supple. No thyromegaly present.  Non tender right cervical nodes  Pulmonary/Chest: Effort normal and breath sounds normal. No respiratory distress. She has no wheezes. She has no rales.          Assessment & Plan:

## 2014-08-13 ENCOUNTER — Other Ambulatory Visit: Payer: Self-pay | Admitting: Internal Medicine

## 2014-12-18 ENCOUNTER — Telehealth: Payer: Self-pay

## 2014-12-18 ENCOUNTER — Other Ambulatory Visit: Payer: Self-pay | Admitting: Internal Medicine

## 2014-12-18 MED ORDER — FLUCONAZOLE 150 MG PO TABS: 150.0000 mg | ORAL_TABLET | Freq: Once | ORAL | Status: DC

## 2014-12-18 NOTE — Telephone Encounter (Signed)
Pt left v/m requesting refill diflucan to [place]. Pt was given antibiotic by dentist prior to dental surgery. Pt is having white vaginal discharge with perineal itching. Pt has not had diflucan in years.Please advise.

## 2014-12-18 NOTE — Telephone Encounter (Signed)
Diflucan sent to [facility], if symptoms persist will need OV

## 2015-01-26 ENCOUNTER — Other Ambulatory Visit: Payer: Self-pay | Admitting: Internal Medicine

## 2015-02-03 ENCOUNTER — Telehealth: Payer: Self-pay | Admitting: Internal Medicine

## 2015-02-03 NOTE — Telephone Encounter (Signed)
Pt is due for screening Mammo. [facility] needs order in [place] before they will schedule.

## 2015-02-04 NOTE — Telephone Encounter (Signed)
I don't understand that. You don't need a physician's order for a mammogram

## 2015-02-04 NOTE — Telephone Encounter (Signed)
Called the patient and when she called to make the appt she told them one of her breasts  was sore and right then they wouldn't let her set up a screening MMG. Now the breast isnt sore she says so she will probably call them back to set up screening. I explained that when you are having a breast problem that it becomes a diagnostic mammogram not a screening.

## 2015-02-04 NOTE — Telephone Encounter (Signed)
Thanks If she is having symptoms, I need to see her first before setting up diagnostic mammo

## 2015-02-12 ENCOUNTER — Other Ambulatory Visit: Payer: Self-pay

## 2015-02-12 DIAGNOSIS — Z1231 Encounter for screening mammogram for malignant neoplasm of breast: Secondary | ICD-10-CM

## 2015-02-21 ENCOUNTER — Other Ambulatory Visit: Payer: Self-pay | Admitting: Internal Medicine

## 2015-03-05 ENCOUNTER — Ambulatory Visit: Payer: Self-pay

## 2015-03-12 ENCOUNTER — Ambulatory Visit
Admission: RE | Admit: 2015-03-12 | Discharge: 2015-03-12 | Disposition: A | Discharge status: Home / Self Care | Payer: PRIVATE HEALTH INSURANCE | Source: Ambulatory Visit

## 2015-03-12 DIAGNOSIS — Z1231 Encounter for screening mammogram for malignant neoplasm of breast: Secondary | ICD-10-CM

## 2015-03-16 ENCOUNTER — Encounter: Payer: Self-pay | Admitting: *Deleted

## 2015-04-14 ENCOUNTER — Other Ambulatory Visit: Payer: Self-pay | Admitting: Internal Medicine

## 2015-04-14 NOTE — Telephone Encounter (Signed)
Approved: #[id number] x [phone] Okay to refill

## 2015-04-14 NOTE — Telephone Encounter (Signed)
05/21/14, not on current med list

## 2015-04-14 NOTE — Telephone Encounter (Signed)
rx called into pharmacy

## 2015-05-27 ENCOUNTER — Encounter: Payer: Self-pay | Admitting: Internal Medicine

## 2015-05-27 ENCOUNTER — Ambulatory Visit (INDEPENDENT_AMBULATORY_CARE_PROVIDER_SITE_OTHER): Payer: PRIVATE HEALTH INSURANCE | Admitting: Internal Medicine

## 2015-05-27 VITALS — BP 120/80 | HR 73 | Temp 97.8°F | Ht 66.0 in | Wt 187.0 lb

## 2015-05-27 DIAGNOSIS — Z0000 Encounter for general adult medical examination without abnormal findings: Secondary | ICD-10-CM

## 2015-05-27 DIAGNOSIS — N951 Menopausal and female climacteric states: Secondary | ICD-10-CM

## 2015-05-27 NOTE — Progress Notes (Signed)
Subjective:    Patient ID: [name], female    DOB: 24-Oct-1956, 59 y.o.   MRN: [id number]  HPI Here for physical Doing very well  Did see gyn about rectocele No action for now Did discuss that [name] should not push-- discussed regular miralax  Still on estrogen Only able to skip 1 day a week--or [name] gets bad flashes, etc  Uses alprazolam at night if [name] thinks [name] won't sleep  Current Outpatient Prescriptions on File Prior to Visit  Medication Sig Dispense Refill  . ALPRAZolam (XANAX) 0.5 MG tablet TAKE 1/2 TO 1 TABLET BY MOUTH TWICE A DAY AS NEEDED 60 tablet 0  . Calcium Carb-Cholecalciferol (CALCIUM 600 + D) 600-200 MG-UNIT TABS Take 3 tablets by mouth daily.    Marland Kitchen estradiol (ESTRACE) 1 MG tablet TAKE ONE (1) TABLET BY MOUTH EVERY DAY 30 tablet 1  . fluticasone (FLONASE) 50 MCG/ACT nasal spray Place 2 sprays into both nostrils daily.     . psyllium (METAMUCIL) 58.6 % powder Take 1 packet by mouth daily.     No current facility-administered medications on file prior to visit.    No Known Allergies  Past Medical History  Diagnosis Date  . Depression   . Anxiety   . GERD (gastroesophageal reflux disease)   . Allergic rhinitis due to pollen     Past Surgical History  Procedure Laterality Date  . Abdominal hysterectomy  08/1998    BSO fibroids  . Hernia repair  11/2003    RIH (ingram)  . Colon surgery    . Cholecystectomy  8/12    Family History  Problem Relation Age of Onset  . Stroke Father   . Heart disease Brother 64    recent MI  . Colon polyps Neg Hx   . Stomach cancer Neg Hx     History   Social History  . Marital Status: Married    Spouse Name: N/A  . Number of Children: N/A  . Years of Education: N/A   Occupational History  . Housekeeper    Social History Main Topics  . Smoking status: Never Smoker   . Smokeless tobacco: Never Used  . Alcohol Use: No  . Drug Use: No  . Sexual Activity: Not on file   Other Topics Concern  . Not on  file   Social History Narrative   Review of Systems  Constitutional:       Weight is up 6# No set exercise--knows [name] is stress overeating at times Wears seat belt  HENT: Negative for dental problem, hearing loss and tinnitus.        Keeps up with dentist  Eyes: Negative for visual disturbance.       No diplopia or unilateral vision loss  Respiratory: Negative for cough, chest tightness and shortness of breath.   Cardiovascular: Negative for chest pain, palpitations and leg swelling.  Gastrointestinal: Positive for constipation. Negative for nausea, vomiting, abdominal pain and blood in stool.  Endocrine: Negative for polydipsia and polyuria.  Genitourinary: Positive for difficulty urinating. Negative for dysuria, hematuria and dyspareunia.       Slow stream  Musculoskeletal: Negative for back pain, joint swelling and arthralgias.  Skin: Negative for rash.       No suspicious lesions  Allergic/Immunologic: Positive for environmental allergies. Negative for immunocompromised state.  Neurological: Positive for numbness. Negative for dizziness, syncope and light-headedness.       Right hand numbness and pain--?CTS. Discussed brace.  Hematological: Negative for  adenopathy. Does not bruise/bleed easily.  Psychiatric/Behavioral: Negative for sleep disturbance and dysphoric mood. The patient is not nervous/anxious.        Usually sleeps okay--rare alprazolam       Objective:   Physical Exam  Constitutional: [name] is oriented to person, place, and time. [name] appears well-developed and well-nourished. No distress.  HENT:  Head: Normocephalic and atraumatic.  Right Ear: External ear normal.  Left Ear: External ear normal.  Mouth/Throat: Oropharynx is clear and moist. No oropharyngeal exudate.  Eyes: Conjunctivae and EOM are normal. Pupils are equal, round, and reactive to light.  Neck: Normal range of motion. Neck supple. No thyromegaly present.  Cardiovascular: Normal rate, regular  rhythm, normal heart sounds and intact distal pulses.  Exam reveals no gallop.   No murmur heard. Pulmonary/Chest: Effort normal and breath sounds normal. No respiratory distress. [name] has no wheezes. [name] has no rales.  Abdominal: Soft. There is no tenderness.  Musculoskeletal: [name] exhibits no edema or tenderness.  Lymphadenopathy:    [name] has no cervical adenopathy.  Neurological: [name] is alert and oriented to person, place, and time.  Skin: No rash noted. No erythema.  Psychiatric: [name] has a normal mood and affect. Her behavior is normal.          Assessment & Plan:

## 2015-05-27 NOTE — Patient Instructions (Signed)
Please try to cut your estrogen in half--- first one day and then more days, if you tolerate it.  DASH Eating Plan DASH stands for "Dietary Approaches to Stop Hypertension." The DASH eating plan is a healthy eating plan that has been shown to reduce high blood pressure (hypertension). Additional health benefits may include reducing the risk of type 2 diabetes mellitus, heart disease, and stroke. The DASH eating plan may also help with weight loss. WHAT DO I NEED TO KNOW ABOUT THE DASH EATING PLAN? For the DASH eating plan, you will follow these general guidelines:  Choose foods with a percent daily value for sodium of less than 5% (as listed on the food label).  Use salt-free seasonings or herbs instead of table salt or sea salt.  Check with your health care provider or pharmacist before using salt substitutes.  Eat lower-sodium products, often labeled as "lower sodium" or "no salt added."  Eat fresh foods.  Eat more vegetables, fruits, and low-fat dairy products.  Choose whole grains. Look for the word "whole" as the first word in the ingredient list.  Choose fish and skinless chicken or [place] more often than red meat. Limit fish, poultry, and meat to 6 oz (170 g) each day.  Limit sweets, desserts, sugars, and sugary drinks.  Choose heart-healthy fats.  Limit cheese to 1 oz (28 g) per day.  Eat more home-cooked food and less restaurant, buffet, and fast food.  Limit fried foods.  Cook foods using methods other than frying.  Limit canned vegetables. If you do use them, rinse them well to decrease the sodium.  When eating at a restaurant, ask that your food be prepared with less salt, or no salt if possible. WHAT FOODS CAN I EAT? Seek help from a dietitian for individual calorie needs. Grains Whole grain or whole wheat bread. Brown rice. Whole grain or whole wheat pasta. Quinoa, bulgur, and whole grain cereals. Low-sodium cereals. Corn or whole wheat flour tortillas. Whole  grain cornbread. Whole grain crackers. Low-sodium crackers. Vegetables Fresh or frozen vegetables (raw, steamed, roasted, or grilled). Low-sodium or reduced-sodium tomato and vegetable juices. Low-sodium or reduced-sodium tomato sauce and paste. Low-sodium or reduced-sodium canned vegetables.  Fruits All fresh, canned (in natural juice), or frozen fruits. Meat and Other Protein Products Ground beef (85% or leaner), grass-fed beef, or beef trimmed of fat. Skinless chicken or [place]. Ground chicken or [place]. Pork trimmed of fat. All fish and seafood. Eggs. Dried beans, peas, or lentils. Unsalted nuts and seeds. Unsalted canned beans. Dairy Low-fat dairy products, such as skim or 1% milk, 2% or reduced-fat cheeses, low-fat ricotta or cottage cheese, or plain low-fat yogurt. Low-sodium or reduced-sodium cheeses. Fats and Oils Tub margarines without trans fats. Light or reduced-fat mayonnaise and salad dressings (reduced sodium). Avocado. Safflower, olive, or canola oils. Natural peanut or almond butter. Other Unsalted popcorn and pretzels. The items listed above may not be a complete list of recommended foods or beverages. Contact your dietitian for more options. WHAT FOODS ARE NOT RECOMMENDED? Grains White bread. White pasta. White rice. Refined cornbread. Bagels and croissants. Crackers that contain trans fat. Vegetables Creamed or fried vegetables. Vegetables in a cheese sauce. Regular canned vegetables. Regular canned tomato sauce and paste. Regular tomato and vegetable juices. Fruits Dried fruits. Canned fruit in light or heavy syrup. Fruit juice. Meat and Other Protein Products Fatty cuts of meat. Ribs, chicken wings, bacon, sausage, bologna, salami, chitterlings, fatback, hot dogs, bratwurst, and packaged luncheon meats. Salted nuts and  seeds. Canned beans with salt. Dairy Whole or 2% milk, cream, half-and-half, and cream cheese. Whole-fat or sweetened yogurt. Full-fat cheeses or blue  cheese. Nondairy creamers and whipped toppings. Processed cheese, cheese spreads, or cheese curds. Condiments Onion and garlic salt, seasoned salt, table salt, and sea salt. Canned and packaged gravies. Worcestershire sauce. Tartar sauce. Barbecue sauce. Teriyaki sauce. Soy sauce, including reduced sodium. Steak sauce. Fish sauce. Oyster sauce. Cocktail sauce. Horseradish. Ketchup and mustard. Meat flavorings and tenderizers. Bouillon cubes. Hot sauce. Tabasco sauce. Marinades. Taco seasonings. Relishes. Fats and Oils Butter, stick margarine, lard, shortening, ghee, and bacon fat. Coconut, palm kernel, or palm oils. Regular salad dressings. Other Pickles and olives. Salted popcorn and pretzels. The items listed above may not be a complete list of foods and beverages to avoid. Contact your dietitian for more information. WHERE CAN I FIND MORE INFORMATION? National Heart, Lung, and Blood Institute: travelstabloid.com Document Released: 11/24/2011 Document Revised: 04/21/2014 Document Reviewed: 10/09/2013 [facility] Patient Information 2015 [place], [place]. This information is not intended to replace advice given to you by your health care provider. Make sure you discuss any questions you have with your health care provider.

## 2015-05-27 NOTE — Assessment & Plan Note (Signed)
Healthy but needs to work on fitness No pap due to hyster mammo yearly due to estrogen. Discussed exam--she prefers not after discussion Colonoscopy due 2022

## 2015-05-27 NOTE — Progress Notes (Signed)
Pre visit review using our clinic review tool, if applicable. No additional management support is needed unless otherwise documented below in the visit note. 

## 2015-05-27 NOTE — Assessment & Plan Note (Signed)
Discussed trying to wean estrogen

## 2015-06-01 ENCOUNTER — Other Ambulatory Visit: Payer: Self-pay | Admitting: Internal Medicine

## 2015-06-01 NOTE — Telephone Encounter (Signed)
Last filled 04/14/2015 [facility] PATIENT, Please send back to me for call in

## 2015-06-01 NOTE — Telephone Encounter (Signed)
Please call in.  Thanks.   

## 2015-06-02 NOTE — Telephone Encounter (Signed)
rx called into pharmacy

## 2015-07-04 ENCOUNTER — Other Ambulatory Visit: Payer: Self-pay | Admitting: Internal Medicine

## 2015-07-06 NOTE — Telephone Encounter (Signed)
Approved:okay #[phone] x 0 

## 2015-07-06 NOTE — Telephone Encounter (Signed)
Last f/u 05/2015-CPE

## 2015-07-16 ENCOUNTER — Other Ambulatory Visit: Payer: Self-pay | Admitting: Internal Medicine

## 2015-08-06 ENCOUNTER — Other Ambulatory Visit: Payer: Self-pay | Admitting: Internal Medicine

## 2015-08-06 NOTE — Telephone Encounter (Signed)
07/06/2015

## 2015-08-06 NOTE — Telephone Encounter (Signed)
rx called into pharmacy

## 2015-08-06 NOTE — Telephone Encounter (Signed)
Approved:okay #[phone] x 0 

## 2015-09-15 ENCOUNTER — Encounter: Payer: Self-pay | Admitting: Internal Medicine

## 2015-09-15 ENCOUNTER — Ambulatory Visit (INDEPENDENT_AMBULATORY_CARE_PROVIDER_SITE_OTHER): Payer: PRIVATE HEALTH INSURANCE | Admitting: Internal Medicine

## 2015-09-15 VITALS — BP 120/80 | HR 74 | Temp 97.6°F | Wt 184.0 lb

## 2015-09-15 DIAGNOSIS — R300 Dysuria: Secondary | ICD-10-CM

## 2015-09-15 DIAGNOSIS — N3000 Acute cystitis without hematuria: Secondary | ICD-10-CM

## 2015-09-15 LAB — POCT URINALYSIS DIPSTICK
BILIRUBIN UA: NEGATIVE
GLUCOSE UA: NEGATIVE
KETONES UA: NEGATIVE
NITRITE UA: NEGATIVE
Protein, UA: NEGATIVE
Spec Grav, UA: 1.01
Urobilinogen, UA: NEGATIVE
pH, UA: 6

## 2015-09-15 MED ORDER — SULFAMETHOXAZOLE-TRIMETHOPRIM 800-160 MG PO TABS: 1.0000 | ORAL_TABLET | Freq: Two times a day (BID) | ORAL | Status: DC

## 2015-09-15 NOTE — Progress Notes (Signed)
Pre visit review using our clinic review tool, if applicable. No additional management support is needed unless otherwise documented below in the visit note. 

## 2015-09-15 NOTE — Patient Instructions (Signed)
Start the antibiotic now-- and take another dose this evening. If your symptoms are gone by the morning, you can stop after 3 days (and save the rest for the next episode).

## 2015-09-15 NOTE — Progress Notes (Signed)
   Subjective:    Patient ID: [name], female    DOB: 1956-01-01, 59 y.o.   MRN: [id number]  HPI Here due to urinary symptoms  Not frequent symptoms--- 3-4 times in her life Started with urgency and then terminal dysuria (she thinks it is typical for her but not burning) Started yesterday No hematuria Some increased frequency--and gets post void urgency "Sick" feeling in suprapubic area  No Rx  Current Outpatient Prescriptions on File Prior to Visit  Medication Sig Dispense Refill  . ALPRAZolam (XANAX) 0.5 MG tablet TAKE 1/2 - 1 TABLET BY MOUTH TWICE A DAYAS NEEDED 60 tablet 0  . Calcium Carb-Cholecalciferol (CALCIUM 600 + D) 600-200 MG-UNIT TABS Take 3 tablets by mouth daily.    Marland Kitchen estradiol (ESTRACE) 1 MG tablet TAKE 1 TABLET BY MOUTH DAILY 30 tablet 11  . fluticasone (FLONASE) 50 MCG/ACT nasal spray Place 2 sprays into both nostrils daily.     . psyllium (METAMUCIL) 58.6 % powder Take 1 packet by mouth daily.     No current facility-administered medications on file prior to visit.    No Known Allergies  Past Medical History  Diagnosis Date  . Depression   . Anxiety   . GERD (gastroesophageal reflux disease)   . Allergic rhinitis due to pollen     Past Surgical History  Procedure Laterality Date  . Abdominal hysterectomy  08/1998    BSO fibroids  . Hernia repair  11/2003    RIH (ingram)  . Colon surgery    . Cholecystectomy  8/12    Family History  Problem Relation Age of Onset  . Stroke Father   . Heart disease Brother 39    recent MI  . Colon polyps Neg Hx   . Stomach cancer Neg Hx     Social History   Social History  . Marital Status: Married    Spouse Name: N/A  . Number of Children: N/A  . Years of Education: N/A   Occupational History  . Housekeeper    Social History Main Topics  . Smoking status: Never Smoker   . Smokeless tobacco: Never Used  . Alcohol Use: No  . Drug Use: No  . Sexual Activity: Not on file   Other Topics Concern    . Not on file   Social History Narrative   Review of Systems No N/V--but did feel a little sick yesterday Appetite is okay No back pain that is different    Objective:   Physical Exam  Constitutional: She appears well-developed and well-nourished. No distress.  Abdominal: Soft. She exhibits no distension. There is no rebound and no guarding.  Mild suprapubic tenderness  Musculoskeletal:  No CVA tenderness          Assessment & Plan:

## 2015-09-15 NOTE — Assessment & Plan Note (Signed)
Classic symptoms of just bladder infection Will treat with septra--hopefully only needs 3 days

## 2015-09-29 ENCOUNTER — Other Ambulatory Visit: Payer: Self-pay | Admitting: Internal Medicine

## 2015-09-29 NOTE — Telephone Encounter (Signed)
Approved: #[phone] x 0 

## 2015-09-29 NOTE — Telephone Encounter (Signed)
08/06/2015 

## 2015-09-29 NOTE — Telephone Encounter (Signed)
rx called into pharmacy

## 2015-10-09 ENCOUNTER — Ambulatory Visit (INDEPENDENT_AMBULATORY_CARE_PROVIDER_SITE_OTHER): Payer: PRIVATE HEALTH INSURANCE

## 2015-10-09 DIAGNOSIS — Z23 Encounter for immunization: Secondary | ICD-10-CM

## 2015-10-14 ENCOUNTER — Other Ambulatory Visit: Payer: Self-pay | Admitting: Internal Medicine

## 2015-11-04 ENCOUNTER — Encounter (INDEPENDENT_AMBULATORY_CARE_PROVIDER_SITE_OTHER): Payer: Self-pay

## 2015-11-04 ENCOUNTER — Ambulatory Visit (INDEPENDENT_AMBULATORY_CARE_PROVIDER_SITE_OTHER): Payer: PRIVATE HEALTH INSURANCE | Admitting: Internal Medicine

## 2015-11-04 ENCOUNTER — Encounter: Payer: Self-pay | Admitting: Internal Medicine

## 2015-11-04 VITALS — BP 128/88 | HR 77 | Temp 97.8°F | Wt 186.0 lb

## 2015-11-04 DIAGNOSIS — R05 Cough: Secondary | ICD-10-CM | POA: Diagnosis not present

## 2015-11-04 DIAGNOSIS — R059 Cough, unspecified: Secondary | ICD-10-CM

## 2015-11-04 MED ORDER — HYDROCODONE-HOMATROPINE 5-1.5 MG/5ML PO SYRP: 5.0000 mL | ORAL_SOLUTION | Freq: Every evening | ORAL | Status: DC | PRN

## 2015-11-04 NOTE — Assessment & Plan Note (Signed)
Seems to have a self limited viral infection No signs of secondary bacterial infection Will give cough syrup Discussed signs of secondary infection

## 2015-11-04 NOTE — Progress Notes (Signed)
Pre visit review using our clinic review tool, if applicable. No additional management support is needed unless otherwise documented below in the visit note. 

## 2015-11-04 NOTE — Progress Notes (Signed)
   Subjective:    Patient ID: [name], female    DOB: March 29, 1956, 59 y.o.   MRN: [id number]  HPI  Here due to cough  Started a week ago with burning throat Recent trip to [place]--family death Thought it was something she ate  Went into a cold-- cough, sneezing, headache, rhinorrhea Slight pressure in ears Some hoarseness Bad cough at night--keeps her up Tried hot tea--not really helpful  Spells of feeling better in day No fever No SOB  Using ibuprofen 800 once in a while (for the headache)  Current Outpatient Prescriptions on File Prior to Visit  Medication Sig Dispense Refill  . ALPRAZolam (XANAX) 0.5 MG tablet TAKE 1/2 TO 1 TABLET BY MOUTH TWICE A DAY AS DIRECTED. 60 tablet 0  . Calcium Carb-Cholecalciferol (CALCIUM 600 + D) 600-200 MG-UNIT TABS Take 3 tablets by mouth daily.    Marland Kitchen estradiol (ESTRACE) 1 MG tablet TAKE 1 TABLET BY MOUTH DAILY 30 tablet 11  . fluticasone (FLONASE) 50 MCG/ACT nasal spray INHALE 2 SPRAYS INTO EACH NOSTRIL EVERY DAY. 16 g 5  . psyllium (METAMUCIL) 58.6 % powder Take 1 packet by mouth daily.     No current facility-administered medications on file prior to visit.    No Known Allergies  Past Medical History  Diagnosis Date  . Depression   . Anxiety   . GERD (gastroesophageal reflux disease)   . Allergic rhinitis due to pollen     Past Surgical History  Procedure Laterality Date  . Abdominal hysterectomy  08/1998    BSO fibroids  . Hernia repair  11/2003    RIH (ingram)  . Colon surgery    . Cholecystectomy  8/12    Family History  Problem Relation Age of Onset  . Stroke Father   . Heart disease Brother 75    recent MI  . Colon polyps Neg Hx   . Stomach cancer Neg Hx     Social History   Social History  . Marital Status: Married    Spouse Name: N/A  . Number of Children: N/A  . Years of Education: N/A   Occupational History  . Housekeeper    Social History Main Topics  . Smoking status: Never Smoker   . Smokeless  tobacco: Never Used  . Alcohol Use: No  . Drug Use: No  . Sexual Activity: Not on file   Other Topics Concern  . Not on file   Social History Narrative   Review of Systems  No rash No vomiting but some dry heaving from coughing spells at night No diarrhea Appetite is okay     Objective:   Physical Exam  Constitutional: She appears well-developed and well-nourished. No distress.  HENT:  Mouth/Throat: Oropharynx is clear and moist. No oropharyngeal exudate.  No sinus tenderness Right TM is retracted but no clear fluid (past surgery) Left TM normal Mild nasal inflammation   Neck: Normal range of motion. Neck supple. No thyromegaly present.  Pulmonary/Chest: Effort normal and breath sounds normal. No respiratory distress. She has no wheezes. She has no rales.  Lymphadenopathy:    She has no cervical adenopathy.          Assessment & Plan:

## 2015-11-05 ENCOUNTER — Other Ambulatory Visit: Payer: Self-pay | Admitting: *Deleted

## 2015-11-05 ENCOUNTER — Telehealth: Payer: Self-pay | Admitting: Internal Medicine

## 2015-11-05 MED ORDER — BENZONATATE 200 MG PO CAPS: 200.0000 mg | ORAL_CAPSULE | Freq: Three times a day (TID) | ORAL | Status: DC | PRN

## 2015-11-05 NOTE — Telephone Encounter (Signed)
[facility]    --------------------------------------------------------------------------------   Patient Name: [name]  Gender: Female  DOB: [birth year]   Age: 59 Y 10 M 7 D  Return Phone Number: [phone] (Primary)  Address:     City/State/Zip:       Client [facility] - Client  Client Site [facility]  Physician Viviana Simpler   Contact Type Call  Call Type Triage / Clinical  Relationship To Patient Self  Return Phone Number [phone] (Primary)  Chief Complaint Vomiting  Initial Comment Caller states c/o vomiting and nausea, saw MD yesterday for cold symptoms and he prescribed a cough syrup, vomiting began after 1st dose.  PreDisposition Home Care       Nurse Assessment  Nurse: Amalia Hailey, RN, Melissa Date/Time Eilene Ghazi Time): 11/05/2015 3:03:23 PM  Confirm and document reason for call. If symptomatic, describe symptoms. ---Caller states c/o vomiting and nausea, saw MD yesterday for cold symptoms and he prescribed a cough syrup, vomiting began after 1st dose.    Has the patient traveled out of the country within the last 30 days? ---Not Applicable    Does the patient have any new or worsening symptoms? ---Yes    Will a triage be completed? ---Yes    Related visit to physician within the last 2 weeks? ---Yes    Does the PT have any chronic conditions? (i.e. diabetes, asthma, etc.) ---Yes    List chronic conditions. ---xanax-anxiety uses as needed, Reflux, allergic rhinitis due to pollen    Is this a behavioral health call? ---No           Guidelines          Guideline Title Affirmed Question Affirmed Notes Nurse Date/Time Eilene Ghazi Time)  Vomiting MILD or MODERATE vomiting (e.g., 1 - 5 times / day) (all triage questions negative)    Amalia Hailey, RN, Melissa 11/05/2015 3:07:57 PM    Disp. Time Eilene Ghazi Time)  Disposition Final User    11/05/2015 2:32:49 PM Send To Clinical Follow Up Rich Brave, Amy      11/05/2015 3:17:06 PM Home Care Yes Amalia Hailey, RN, Lenna Sciara            Caller Understands: Yes  Disagree/Comply: Comply       Care Advice Given Per Guideline        HOME CARE: You should be able to treat this at home. REASSURANCE: Vomiting can be caused by many things. It is often caused by a stomach virus (stomach flu) or mild food poisoning. Staying well-hydrated is the most important thing. CLEAR LIQUIDS: Try to sip small amounts (1 tablespoon or 15 ml) of liquid frequently (every 5 minutes) for 8 hours, rather than trying to drink a lot of liquid all at one time. * Sip water or a 1/2 stength sports drink (e.g., Gatorade or Powerade). * Other options: 1/2 strength flat lemon-lime soda or ginger ale. * After 4 hours without vomiting, increase the amount. SOLID FOOD: * You may begin eating bland foods after 8 hours without vomiting. * Start with saltine crackers, white bread, rice, mashed potatoes, cereal, applesauce, etc. EXPECTED COURSE: Vomiting from viral gastritis (stomach flu) usually stops in 12 to 48 hours. If diarrhea is present, it usually continues for several days. CALL BACK IF: * Vomiting lasts more than 2 days (48 hours) * Contant stomach pain lasting more than 2  hours * Signs of dehydration (e.g., no urination over 12 hours, very lightheaded) * You become worse.    After Care Instructions Given        Call Event Type User Date / Time Description        --------------------------------------------------------------------------------

## 2015-11-05 NOTE — Telephone Encounter (Signed)
Please call her Some people have vomiting with narcotics so you have to stop this medication You can try robitussin DM or other OTC If she wants to try benzonatate 200mg  tid prn ---okay to send Rx #30 x 0

## 2015-11-05 NOTE — Telephone Encounter (Signed)
Patient advised.  Medication sent to pharmacy.

## 2016-05-26 ENCOUNTER — Other Ambulatory Visit: Payer: Self-pay | Admitting: Internal Medicine

## 2016-05-26 DIAGNOSIS — Z1231 Encounter for screening mammogram for malignant neoplasm of breast: Secondary | ICD-10-CM

## 2016-06-03 ENCOUNTER — Ambulatory Visit
Admission: RE | Admit: 2016-06-03 | Discharge: 2016-06-03 | Disposition: A | Discharge status: Home / Self Care | Payer: PRIVATE HEALTH INSURANCE | Source: Ambulatory Visit | Attending: Internal Medicine | Admitting: Internal Medicine

## 2016-06-03 DIAGNOSIS — Z1231 Encounter for screening mammogram for malignant neoplasm of breast: Secondary | ICD-10-CM

## 2016-06-05 ENCOUNTER — Other Ambulatory Visit: Payer: Self-pay | Admitting: Internal Medicine

## 2016-06-06 NOTE — Telephone Encounter (Signed)
Approved: #[phone] x 0 

## 2016-06-06 NOTE — Telephone Encounter (Signed)
Last filled 12-17-15 #60 Last OV Acute 11-04-15 Next OV 06-07-16

## 2016-06-06 NOTE — Telephone Encounter (Signed)
Left refill on voice mail at pharmacy  

## 2016-06-07 ENCOUNTER — Encounter: Payer: Self-pay | Admitting: Internal Medicine

## 2016-06-07 ENCOUNTER — Ambulatory Visit (INDEPENDENT_AMBULATORY_CARE_PROVIDER_SITE_OTHER): Payer: PRIVATE HEALTH INSURANCE | Admitting: Internal Medicine

## 2016-06-07 VITALS — BP 112/86 | HR 72 | Temp 97.8°F | Ht 65.5 in | Wt 184.0 lb

## 2016-06-07 DIAGNOSIS — Z0000 Encounter for general adult medical examination without abnormal findings: Secondary | ICD-10-CM | POA: Diagnosis not present

## 2016-06-07 DIAGNOSIS — K219 Gastro-esophageal reflux disease without esophagitis: Secondary | ICD-10-CM | POA: Diagnosis not present

## 2016-06-07 DIAGNOSIS — N951 Menopausal and female climacteric states: Secondary | ICD-10-CM | POA: Diagnosis not present

## 2016-06-07 LAB — COMPREHENSIVE METABOLIC PANEL
ALT: 15 U/L (ref 0–35)
AST: 18 U/L (ref 0–37)
Albumin: 3.8 g/dL (ref 3.5–5.2)
Alkaline Phosphatase: 82 U/L (ref 39–117)
BUN: 12 mg/dL (ref 6–23)
CALCIUM: 9.5 mg/dL (ref 8.4–10.5)
CHLORIDE: 106 meq/L (ref 96–112)
CO2: 31 meq/L (ref 19–32)
Creatinine, Ser: 0.74 mg/dL (ref 0.40–1.20)
GFR: 84.96 mL/min (ref >60.00)
Glucose, Bld: 103 mg/dL — ABNORMAL HIGH (ref 70–99)
Potassium: 4.9 mEq/L (ref 3.5–5.1)
Sodium: 140 mEq/L (ref 135–145)
Total Bilirubin: 0.4 mg/dL (ref 0.2–1.2)
Total Protein: 6.9 g/dL (ref 6.0–8.3)

## 2016-06-07 LAB — CBC WITH DIFFERENTIAL/PLATELET
BASOS PCT: 0.3 % (ref 0.0–3.0)
Basophils Absolute: 0 10*3/uL (ref 0.0–0.1)
Eosinophils Absolute: 0.1 10*3/uL (ref 0.0–0.7)
Eosinophils Relative: 1.5 % (ref 0.0–5.0)
HEMATOCRIT: 44.6 % (ref 36.0–46.0)
Hemoglobin: 15 g/dL (ref 12.0–15.0)
LYMPHS PCT: 24 % (ref 12.0–46.0)
Lymphs Abs: 1.7 10*3/uL (ref 0.7–4.0)
MCHC: 33.5 g/dL (ref 30.0–36.0)
MCV: 96.4 fl (ref 78.0–100.0)
Monocytes Absolute: 0.7 10*3/uL (ref 0.1–1.0)
Monocytes Relative: 9.4 % (ref 3.0–12.0)
NEUTROS ABS: 4.6 10*3/uL (ref 1.4–7.7)
Neutrophils Relative %: 64.8 % (ref 43.0–77.0)
PLATELETS: 241 10*3/uL (ref 150.0–400.0)
RBC: 4.63 Mil/uL (ref 3.87–5.11)
RDW: 12.8 % (ref 11.5–15.5)
WBC: 7.1 10*3/uL (ref 4.0–10.5)

## 2016-06-07 LAB — T4, FREE: Free T4: 0.85 ng/dL (ref 0.60–1.60)

## 2016-06-07 MED ORDER — ZOSTER VACCINE LIVE 19400 UNT/0.65ML ~~LOC~~ SUSR: 0.6500 mL | Freq: Once | SUBCUTANEOUS | Status: DC

## 2016-06-07 NOTE — Progress Notes (Signed)
Pre visit review using our clinic review tool, if applicable. No additional management support is needed unless otherwise documented below in the visit note. 

## 2016-06-07 NOTE — Assessment & Plan Note (Signed)
Generally healthy Discussed fitness Colon due in December for adenomatous polyp Will Rx zostavax Just had mammo No pap due to hyster

## 2016-06-07 NOTE — Progress Notes (Signed)
Subjective:    Patient ID: [name], female    DOB: [birth year], 60 y.o.   MRN: [id number]  HPI Here for physical  Left eye is red and swollen for past 2 days Wondered if it was related to A/C in car--then rubbing a lot Needed to put wet washcloth on for itching No eye allergy symptoms generally No discharge  Uses metamucil/miralax regularly Keeps her regular No longer seeing gyn  Uses alprazolam really rarely 1/2 at bedtime once in a while May only use 5-10 per year  Current Outpatient Prescriptions on File Prior to Visit  Medication Sig Dispense Refill  . Calcium Carb-Cholecalciferol (CALCIUM 600 + D) 600-200 MG-UNIT TABS Take 3 tablets by mouth daily.    Marland Kitchen estradiol (ESTRACE) 1 MG tablet TAKE 1 TABLET BY MOUTH DAILY 30 tablet 11  . fluticasone (FLONASE) 50 MCG/ACT nasal spray INHALE 2 SPRAYS INTO EACH NOSTRIL EVERY DAY. 16 g 5  . psyllium (METAMUCIL) 58.6 % powder Take 1 packet by mouth daily.     No current facility-administered medications on file prior to visit.    No Known Allergies  Past Medical History  Diagnosis Date  . Depression   . Anxiety   . GERD (gastroesophageal reflux disease)   . Allergic rhinitis due to pollen     Past Surgical History  Procedure Laterality Date  . Abdominal hysterectomy  08/1998    BSO fibroids  . Hernia repair  11/2003    RIH (ingram)  . Colon surgery    . Cholecystectomy  8/12    Family History  Problem Relation Age of Onset  . Stroke Father   . Heart disease Brother 107    recent MI  . Colon polyps Neg Hx   . Stomach cancer Neg Hx     Social History   Social History  . Marital Status: Married    Spouse Name: N/A  . Number of Children: N/A  . Years of Education: N/A   Occupational History  . Housekeeper    Social History Main Topics  . Smoking status: Never Smoker   . Smokeless tobacco: Never Used  . Alcohol Use: No  . Drug Use: No  . Sexual Activity: Not on file   Other Topics Concern  . Not on  file   Social History Narrative   Review of Systems  Constitutional: Negative for fatigue and unexpected weight change.       Physically active at work--trying to walk some Wears seat belt  HENT: Negative for hearing loss, tinnitus and trouble swallowing.        Teeth sensitive lately---uses whitener Keeps up with dentist  Eyes: Positive for redness. Negative for visual disturbance.       No diplopia or unilateral vision loss  Respiratory: Negative for cough, chest tightness and shortness of breath.   Cardiovascular: Negative for chest pain and leg swelling.       Rare palpitations with panic attack (only twice in past year)  Gastrointestinal: Negative for nausea, vomiting, abdominal pain, constipation and blood in stool.       No recent heartburn issues  Endocrine: Negative for polydipsia and polyuria.  Genitourinary: Negative for dysuria, hematuria, difficulty urinating and dyspareunia.       Hasn't been able to tolerate weaning estrogen  Musculoskeletal: Positive for back pain. Negative for joint swelling and arthralgias.       Thinks she pulled a muscle in low back--intermittent pain  Skin: Negative for rash.  No suspicious lesions  Allergic/Immunologic: Positive for environmental allergies. Negative for immunocompromised state.       Hasn't needed flonase this year  Neurological: Negative for dizziness, syncope, weakness, light-headedness and headaches.  Hematological: Negative for adenopathy. Does not bruise/bleed easily.  Psychiatric/Behavioral: Negative for sleep disturbance and dysphoric mood. The patient is nervous/anxious.        Objective:   Physical Exam  Constitutional: She is oriented to person, place, and time. She appears well-developed and well-nourished. No distress.  HENT:  Head: Normocephalic and atraumatic.  Right Ear: External ear normal.  Left Ear: External ear normal.  Mouth/Throat: Oropharynx is clear and moist. No oropharyngeal exudate.  Eyes:  Conjunctivae are normal. Pupils are equal, round, and reactive to light.  Redness/inflammation along left inner canthus--discussed cool compresses, etc  Neck: Normal range of motion. Neck supple. No thyromegaly present.  Cardiovascular: Normal rate, regular rhythm, normal heart sounds and intact distal pulses.  Exam reveals no gallop.   No murmur heard. Pulmonary/Chest: Effort normal and breath sounds normal. No respiratory distress. She has no wheezes. She has no rales.  Abdominal: Soft. There is no tenderness.  Musculoskeletal: She exhibits no edema or tenderness.  Lymphadenopathy:    She has no cervical adenopathy.  Neurological: She is alert and oriented to person, place, and time.  Skin: No rash noted. No erythema.  Psychiatric: She has a normal mood and affect. Her behavior is normal.          Assessment & Plan:

## 2016-06-07 NOTE — Assessment & Plan Note (Signed)
She will try to wean again soon

## 2016-06-07 NOTE — Assessment & Plan Note (Signed)
Has been quiet lately No Rx for now

## 2016-07-09 ENCOUNTER — Other Ambulatory Visit: Payer: Self-pay | Admitting: Internal Medicine

## 2016-08-08 ENCOUNTER — Encounter: Payer: Self-pay | Admitting: Gastroenterology

## 2016-09-23 ENCOUNTER — Encounter: Payer: Self-pay | Admitting: Internal Medicine

## 2016-09-23 ENCOUNTER — Ambulatory Visit (INDEPENDENT_AMBULATORY_CARE_PROVIDER_SITE_OTHER): Payer: Managed Care, Other (non HMO) | Admitting: Internal Medicine

## 2016-09-23 VITALS — BP 130/80 | HR 70 | Temp 97.9°F | Wt 187.0 lb

## 2016-09-23 DIAGNOSIS — Z8659 Personal history of other mental and behavioral disorders: Secondary | ICD-10-CM | POA: Insufficient documentation

## 2016-09-23 DIAGNOSIS — Z23 Encounter for immunization: Secondary | ICD-10-CM

## 2016-09-23 DIAGNOSIS — M1811 Unilateral primary osteoarthritis of first carpometacarpal joint, right hand: Secondary | ICD-10-CM

## 2016-09-23 DIAGNOSIS — M1812 Unilateral primary osteoarthritis of first carpometacarpal joint, left hand: Secondary | ICD-10-CM | POA: Diagnosis not present

## 2016-09-23 DIAGNOSIS — F439 Reaction to severe stress, unspecified: Secondary | ICD-10-CM

## 2016-09-23 DIAGNOSIS — M180 Bilateral primary osteoarthritis of first carpometacarpal joints: Secondary | ICD-10-CM

## 2016-09-23 DIAGNOSIS — F39 Unspecified mood [affective] disorder: Secondary | ICD-10-CM | POA: Insufficient documentation

## 2016-09-23 NOTE — Assessment & Plan Note (Signed)
Discussed topical Rx 

## 2016-09-23 NOTE — Addendum Note (Signed)
Addended by: Pilar Grammes on: 09/23/2016 08:15 AM   Modules accepted: Orders

## 2016-09-23 NOTE — Progress Notes (Signed)
Pre visit review using our clinic review tool, if applicable. No additional management support is needed unless otherwise documented below in the visit note. 

## 2016-09-23 NOTE — Progress Notes (Signed)
   Subjective:    Patient ID: [name], female    DOB: August 26, 1956, 59 y.o.   MRN: [id number]  HPI Here for several concerns  "I need to talk about [name] (husband), my nerves are shot" [name] died--no support now. This was a stress to both of them Wonders if she needs to call police on husband Is very forgetful-- "like Alzheimer's" On narcotic patch--and if he drinks along with this, he will have black outs Will be "loud and obnoxious" and then will deny drinking later Does yell some after drinking--she is able to talk him down She doesn't feel in danger  Step son with wife and 2 children have moved in with them Husband now sees [name] Also sees a psychiatrist  Occasional numbness in hands--mostly thumbs Hard reaching (like into her purse) Current Outpatient Prescriptions on File Prior to Visit  Medication Sig Dispense Refill  . Calcium Carb-Cholecalciferol (CALCIUM 600 + D) 600-200 MG-UNIT TABS Take 3 tablets by mouth daily.    Marland Kitchen estradiol (ESTRACE) 1 MG tablet TAKE 1 TABLET BY MOUTH DAILY 30 tablet 11  . fluticasone (FLONASE) 50 MCG/ACT nasal spray INHALE 2 SPRAYS INTO EACH NOSTRIL EVERY DAY. 16 g 5  . psyllium (METAMUCIL) 58.6 % powder Take 1 packet by mouth daily.     No current facility-administered medications on file prior to visit.     No Known Allergies  Past Medical History:  Diagnosis Date  . Allergic rhinitis due to pollen   . Anxiety   . Depression   . GERD (gastroesophageal reflux disease)     Past Surgical History:  Procedure Laterality Date  . ABDOMINAL HYSTERECTOMY  08/1998   BSO fibroids  . CHOLECYSTECTOMY  8/12  . COLON SURGERY    . HERNIA REPAIR  11/2003   RIH (ingram)    Family History  Problem Relation Age of Onset  . Stroke Father   . Heart disease Brother 32    recent MI  . Colon polyps Neg Hx   . Stomach cancer Neg Hx     Social History   Social History  . Marital status: Married    Spouse name: N/A  . Number of children:  N/A  . Years of education: N/A   Occupational History  . Housekeeper [employer]   Social History Main Topics  . Smoking status: Never Smoker  . Smokeless tobacco: Never Used  . Alcohol use No  . Drug use: No  . Sexual activity: Not on file   Other Topics Concern  . Not on file   Social History Narrative  . No narrative on file   Review of Systems  Sleeps okay Appetite is still fine     Objective:   Physical Exam  Musculoskeletal:  Tenderness at [facility] bilaterally--discussed topical heat products  Skin:  62mm white papule on inside tip of nose Left shoulder and mid upper chest has small keratotic lesions--nothing that looks neoplastic  Psychiatric:  Upset about husband but normal speech, appearance No underlying depression          Assessment & Plan:

## 2016-09-23 NOTE — Assessment & Plan Note (Signed)
Counseled about seeking help---doesn't sound like he needs police called  Asked her to go with him to psychiatrist or PCP--and discuss her concerns

## 2016-10-03 ENCOUNTER — Telehealth: Payer: Self-pay

## 2016-10-03 NOTE — Telephone Encounter (Signed)
If the topical Rx is not enough, she can add tylenol 650mg  three times daily &/or ibuprofen 400-600 tid/naproxen (aleve) 1-2 bid If really worsens, would check x-rays of hands

## 2016-10-03 NOTE — Telephone Encounter (Signed)
Spoke to pt

## 2016-10-03 NOTE — Telephone Encounter (Signed)
Pt left v/m; pt was seen 09/23/16 with arthritis in thumbs; since 09/29/16 pt thumb is swollen and has not gone down. Pt wants to know what is next treatment option. [facility].

## 2016-10-11 ENCOUNTER — Encounter: Payer: Self-pay | Admitting: Internal Medicine

## 2016-10-11 ENCOUNTER — Ambulatory Visit (INDEPENDENT_AMBULATORY_CARE_PROVIDER_SITE_OTHER): Payer: Managed Care, Other (non HMO) | Admitting: Internal Medicine

## 2016-10-11 ENCOUNTER — Ambulatory Visit (INDEPENDENT_AMBULATORY_CARE_PROVIDER_SITE_OTHER)
Admission: RE | Admit: 2016-10-11 | Discharge: 2016-10-11 | Disposition: A | Discharge status: Home / Self Care | Payer: Managed Care, Other (non HMO) | Source: Ambulatory Visit | Attending: Internal Medicine | Admitting: Internal Medicine

## 2016-10-11 VITALS — BP 130/88 | HR 69 | Temp 98.1°F | Wt 188.0 lb

## 2016-10-11 DIAGNOSIS — D126 Benign neoplasm of colon, unspecified: Secondary | ICD-10-CM | POA: Insufficient documentation

## 2016-10-11 DIAGNOSIS — M180 Bilateral primary osteoarthritis of first carpometacarpal joints: Secondary | ICD-10-CM

## 2016-10-11 DIAGNOSIS — M1811 Unilateral primary osteoarthritis of first carpometacarpal joint, right hand: Secondary | ICD-10-CM

## 2016-10-11 DIAGNOSIS — M1812 Unilateral primary osteoarthritis of first carpometacarpal joint, left hand: Secondary | ICD-10-CM | POA: Diagnosis not present

## 2016-10-11 DIAGNOSIS — R200 Anesthesia of skin: Secondary | ICD-10-CM | POA: Diagnosis not present

## 2016-10-11 LAB — VITAMIN B12: VITAMIN B 12: 264 pg/mL (ref 211–911)

## 2016-10-11 NOTE — Patient Instructions (Signed)
Continue to walk regularly. Please try core strengthening as well. You can try occasional aleve (naproxen 220) 1-2 twice a day, or advil (ibuprofen) 200mg  2-3 tabs 2-3 times a day to help your thumbs and back.

## 2016-10-11 NOTE — Assessment & Plan Note (Signed)
Due for repeat colon but she prefers not Will do FIT--if positive, she will proceed with colon

## 2016-10-11 NOTE — Assessment & Plan Note (Signed)
Will check x-ray to confirm OA Discussed brief course of NSAIDs

## 2016-10-11 NOTE — Assessment & Plan Note (Signed)
FH of neuropathy Will check B12

## 2016-10-11 NOTE — Progress Notes (Signed)
   Subjective:    Patient ID: [name], female    DOB: 29-Oct-1956, 60 y.o.   MRN: [id number]  HPI Here due to worsening of right thumb swelling Worse since I "squeezed" it at my exam  Left thumb not as bad but does awaken with numbness of both hands and arms  Arms will "buzz" at times when just sitting around Takes about a minute to "wake up" if she moves it around in AM Trouble picking things up, putting dishes away, etc---but mostly pain, not the sensory changes  Concerned about her back also Wants definitive diagnosis to allow aggressive treatment Is taking acetaminophen occasionally Not excited about regular meds  Having trouble peeing Takes a long time to empty bladder No dysuria or hematuria  Current Outpatient Prescriptions on File Prior to Visit  Medication Sig Dispense Refill  . Calcium Carb-Cholecalciferol (CALCIUM 600 + D) 600-200 MG-UNIT TABS Take 3 tablets by mouth daily.    Marland Kitchen estradiol (ESTRACE) 1 MG tablet TAKE 1 TABLET BY MOUTH DAILY (Patient taking differently: TAKE 1 TABLET EVERY OTHER DAY) 30 tablet 11  . psyllium (METAMUCIL) 58.6 % powder Take 1 packet by mouth daily.     No current facility-administered medications on file prior to visit.     No Known Allergies  Past Medical History:  Diagnosis Date  . Allergic rhinitis due to pollen   . Anxiety   . Depression   . GERD (gastroesophageal reflux disease)     Past Surgical History:  Procedure Laterality Date  . ABDOMINAL HYSTERECTOMY  08/1998   BSO fibroids  . CHOLECYSTECTOMY  8/12  . COLON SURGERY    . HERNIA REPAIR  11/2003   RIH (ingram)    Family History  Problem Relation Age of Onset  . Stroke Father   . Heart disease Brother 29    recent MI  . Colon polyps Neg Hx   . Stomach cancer Neg Hx     Social History   Social History  . Marital status: Married    Spouse name: N/A  . Number of children: N/A  . Years of education: N/A   Occupational History  . Housekeeper [employer]   Social History Main Topics  . Smoking status: Never Smoker  . Smokeless tobacco: Never Used  . Alcohol use No  . Drug use: No  . Sexual activity: Not on file   Other Topics Concern  . Not on file   Social History Narrative  . No narrative on file   Review of Systems No sensory changes in feet Does drink tea regularly Was contacted about repeat colonoscopy---won't do it now (till Medicare). Will do fecal test    Objective:   Physical Exam  Constitutional: No distress.  Musculoskeletal:  Mild swelling in right thenar eminence --with tenderness over volar CMC          Assessment & Plan:

## 2016-10-11 NOTE — Progress Notes (Signed)
Pre visit review using our clinic review tool, if applicable. No additional management support is needed unless otherwise documented below in the visit note. 

## 2016-10-24 ENCOUNTER — Other Ambulatory Visit (INDEPENDENT_AMBULATORY_CARE_PROVIDER_SITE_OTHER): Payer: Managed Care, Other (non HMO)

## 2016-10-24 DIAGNOSIS — D126 Benign neoplasm of colon, unspecified: Secondary | ICD-10-CM | POA: Diagnosis not present

## 2016-10-24 LAB — FECAL OCCULT BLOOD, IMMUNOCHEMICAL: Fecal Occult Bld: NEGATIVE

## 2017-01-10 ENCOUNTER — Other Ambulatory Visit: Payer: Self-pay | Admitting: Internal Medicine

## 2017-01-10 NOTE — Telephone Encounter (Signed)
Approved: #[phone] x 0 

## 2017-01-10 NOTE — Telephone Encounter (Signed)
Last called in 06-06-16 #60 Last OV 10-11-16 Next OV 06-13-17  It says in the medication history it was removed in error but did not go back on med list

## 2017-01-10 NOTE — Telephone Encounter (Signed)
Left refill on voice mail at pharmacy  

## 2017-02-02 ENCOUNTER — Encounter: Payer: Self-pay | Admitting: Internal Medicine

## 2017-02-02 ENCOUNTER — Ambulatory Visit (INDEPENDENT_AMBULATORY_CARE_PROVIDER_SITE_OTHER): Payer: Managed Care, Other (non HMO) | Admitting: Internal Medicine

## 2017-02-02 VITALS — BP 106/80 | HR 82 | Temp 98.5°F | Wt 187.0 lb

## 2017-02-02 DIAGNOSIS — J029 Acute pharyngitis, unspecified: Secondary | ICD-10-CM

## 2017-02-02 DIAGNOSIS — D239 Other benign neoplasm of skin, unspecified: Secondary | ICD-10-CM | POA: Diagnosis not present

## 2017-02-02 DIAGNOSIS — M79675 Pain in left toe(s): Secondary | ICD-10-CM | POA: Insufficient documentation

## 2017-02-02 DIAGNOSIS — M79674 Pain in right toe(s): Secondary | ICD-10-CM

## 2017-02-02 LAB — POCT RAPID STREP A (OFFICE): Rapid Strep A Screen: POSITIVE — AB

## 2017-02-02 MED ORDER — AMOXICILLIN 500 MG PO TABS: 1000.0000 mg | ORAL_TABLET | Freq: Two times a day (BID) | ORAL | 0 refills | Status: DC

## 2017-02-02 NOTE — Assessment & Plan Note (Addendum)
No fever or nodes Was quite severe though Rapid strep was positive---will treat with amoxil instead of pcn due to the ear findings

## 2017-02-02 NOTE — Addendum Note (Signed)
Addended by: Pilar Grammes on: 02/02/2017 04:52 PM   Modules accepted: Orders

## 2017-02-02 NOTE — Assessment & Plan Note (Signed)
I suspect this was not gout She will check to see if the borrowed med was colchicine or not Consider checking with acute flare (and maybe blood level for uric acid--but hold off for now)

## 2017-02-02 NOTE — Progress Notes (Signed)
   Subjective:    Patient ID: [name], female    DOB: Mar 02, 1956, 61 y.o.   MRN: [id number]  HPI Here due to respiratory illness  Has burning in her throat--worried about strep Started 2 nights ago Not that sick--so not the flu Had severe headache at first also--unusual for her Very fatigued  No fever No chills or sweats Some fullness in right ear yesterday--not particularly congested though Not much cough No SOB  Tried gargling with salt water--then ginger ale and ibuprofen (helped headache) Is around her grandkids (not sick though)  22mm lesion over sternum above breasts No pain  Thinks she has gout in her toe Borrowed boss's gout medication--helped (not sure what it was) Right great toe Now better  Current Outpatient Prescriptions on File Prior to Visit  Medication Sig Dispense Refill  . ALPRAZolam (XANAX) 0.5 MG tablet TAKE 1/2 TO 1 TABLET BY MOUTH TWICE A DAY AS DIRECTED. 60 tablet 0  . Calcium Carb-Cholecalciferol (CALCIUM 600 + D) 600-200 MG-UNIT TABS Take 3 tablets by mouth daily.    Marland Kitchen estradiol (ESTRACE) 1 MG tablet TAKE 1 TABLET BY MOUTH DAILY (Patient taking differently: TAKE 1 TABLET EVERY OTHER DAY) 30 tablet 11  . psyllium (METAMUCIL) 58.6 % powder Take 1 packet by mouth daily.     No current facility-administered medications on file prior to visit.     No Known Allergies  Past Medical History:  Diagnosis Date  . Allergic rhinitis due to pollen   . Anxiety   . Depression   . GERD (gastroesophageal reflux disease)     Past Surgical History:  Procedure Laterality Date  . ABDOMINAL HYSTERECTOMY  08/1998   BSO fibroids  . CHOLECYSTECTOMY  8/12  . COLON SURGERY    . HERNIA REPAIR  11/2003   RIH (ingram)    Family History  Problem Relation Age of Onset  . Stroke Father   . Heart disease Brother 79    recent MI  . Colon polyps Neg Hx   . Stomach cancer Neg Hx     Social History   Social History  . Marital status: Married    Spouse  name: N/A  . Number of children: N/A  . Years of education: N/A   Occupational History  . Housekeeper [employer]   Social History Main Topics  . Smoking status: Never Smoker  . Smokeless tobacco: Never Used  . Alcohol use No  . Drug use: No  . Sexual activity: Not on file   Other Topics Concern  . Not on file   Social History Narrative  . No narrative on file    Review of Systems No rash No vomiting or diarrhea Appetite is off but is eating    Objective:   Physical Exam  HENT:  Mild pharyngeal injection Left TM normal Right TM distorted with mild central redness Pharynx mildly injected without exudate  Neck: Neck supple. No thyromegaly present.  Pulmonary/Chest: Effort normal and breath sounds normal. No respiratory distress. She has no wheezes. She has no rales.  Musculoskeletal:  No swelling, redness or pain in right great toe (states the entire toe was swollen)  Lymphadenopathy:    She has no cervical adenopathy.  Skin:  2-3 mm firm mass--slightly hyperpigmented (between and above breasts)          Assessment & Plan:

## 2017-02-02 NOTE — Assessment & Plan Note (Signed)
Really bothers her  Liquid nitrogen 45 seconds x 2 Discussed home care

## 2017-02-07 ENCOUNTER — Telehealth: Payer: Self-pay

## 2017-02-07 MED ORDER — AMOXICILLIN-POT CLAVULANATE 875-125 MG PO TABS: 1.0000 | ORAL_TABLET | Freq: Two times a day (BID) | ORAL | 0 refills | Status: AC

## 2017-02-07 NOTE — Telephone Encounter (Signed)
Spoke to pt. Sent in new rx for Augmentin.

## 2017-02-07 NOTE — Telephone Encounter (Signed)
Does she mean worse from the respiratory and ear infection? If so, okay to send Rx for augmentin 875 bid (#20 x 0)

## 2017-02-07 NOTE — Telephone Encounter (Signed)
Pt left v/m; pt was seen 02/02/17 and pt was advised if did not get better to cb for different med. Pt said she is no better and actually feels worse. Pt request different med and pt request cb. [facility].

## 2017-02-08 ENCOUNTER — Telehealth: Payer: Self-pay

## 2017-02-08 MED ORDER — FLUCONAZOLE 150 MG PO TABS: 150.0000 mg | ORAL_TABLET | Freq: Once | ORAL | 1 refills | Status: AC

## 2017-02-08 NOTE — Telephone Encounter (Signed)
Spoke to pt

## 2017-02-08 NOTE — Telephone Encounter (Signed)
Pt left v/m requesting med for yeast infection due to taking abx. Pt wants med previously given for yeast infection; diflucan on hx med list; last filled # 1 on 12/18/14.Please advise.[facility].

## 2017-02-08 NOTE — Telephone Encounter (Signed)
Let her know that I sent the prescription for her

## 2017-05-17 ENCOUNTER — Other Ambulatory Visit: Payer: Self-pay | Admitting: Internal Medicine

## 2017-05-17 DIAGNOSIS — Z1231 Encounter for screening mammogram for malignant neoplasm of breast: Secondary | ICD-10-CM

## 2017-05-25 ENCOUNTER — Other Ambulatory Visit: Payer: Self-pay | Admitting: Internal Medicine

## 2017-05-25 NOTE — Telephone Encounter (Signed)
My mistake. The rx was for #60 not #30. I called it in as previously prescribed.

## 2017-05-25 NOTE — Telephone Encounter (Signed)
Approved: 30 x 0 

## 2017-05-25 NOTE — Telephone Encounter (Signed)
Last filled 01-10-17 #30 Last OV Acute 02-02-17 Next OV 06-13-17   No UDS

## 2017-06-07 ENCOUNTER — Ambulatory Visit
Admission: RE | Admit: 2017-06-07 | Discharge: 2017-06-07 | Disposition: A | Discharge status: Home / Self Care | Payer: Managed Care, Other (non HMO) | Source: Ambulatory Visit | Attending: Internal Medicine | Admitting: Internal Medicine

## 2017-06-07 DIAGNOSIS — Z1231 Encounter for screening mammogram for malignant neoplasm of breast: Secondary | ICD-10-CM

## 2017-06-13 ENCOUNTER — Ambulatory Visit (INDEPENDENT_AMBULATORY_CARE_PROVIDER_SITE_OTHER): Payer: Managed Care, Other (non HMO) | Admitting: Internal Medicine

## 2017-06-13 ENCOUNTER — Encounter: Payer: Self-pay | Admitting: Internal Medicine

## 2017-06-13 VITALS — BP 132/88 | HR 66 | Temp 97.9°F | Ht 65.5 in | Wt 186.0 lb

## 2017-06-13 DIAGNOSIS — Z0000 Encounter for general adult medical examination without abnormal findings: Secondary | ICD-10-CM

## 2017-06-13 DIAGNOSIS — N951 Menopausal and female climacteric states: Secondary | ICD-10-CM

## 2017-06-13 DIAGNOSIS — D126 Benign neoplasm of colon, unspecified: Secondary | ICD-10-CM

## 2017-06-13 MED ORDER — ESTRADIOL 1 MG PO TABS: 0.5000 mg | ORAL_TABLET | Freq: Every day | ORAL | 11 refills | Status: DC

## 2017-06-13 NOTE — Assessment & Plan Note (Signed)
Generally healthy Ongoing stress with husband (live together but estranged and he has issues) Will continue yearly FIT till goes on Medicare Mammogram just done--probably yearly due to estrogen Rx UTD on vaccines--yearly flu

## 2017-06-13 NOTE — Assessment & Plan Note (Signed)
Has cut back on estrogen dose

## 2017-06-13 NOTE — Progress Notes (Signed)
Subjective:    Patient ID: [name], female    DOB: 1956/10/24, 61 y.o.   MRN: [id number]  HPI Here for physical  Doing well Still stress at home Husband with worsening dementia as well as other chronic issues Wanders at night but doesn't remember Discussed needing support about this--may have her brother with nurse partner come live with them  Has 2 spots on shoulders she wants checked Prior treatment spot over sternum is now hypopigmented  Has cut down on the estrogen to every other day  Current Outpatient Prescriptions on File Prior to Visit  Medication Sig Dispense Refill  . ALPRAZolam (XANAX) 0.5 MG tablet TAKE 1/2 TO 1 TABLET TWICE A DAY AS DIRECTED 60 tablet 0  . Calcium Carb-Cholecalciferol (CALCIUM 600 + D) 600-200 MG-UNIT TABS Take 3 tablets by mouth daily.    . psyllium (METAMUCIL) 58.6 % powder Take 1 packet by mouth daily.     No current facility-administered medications on file prior to visit.     No Known Allergies  Past Medical History:  Diagnosis Date  . Allergic rhinitis due to pollen   . Anxiety   . Depression   . GERD (gastroesophageal reflux disease)     Past Surgical History:  Procedure Laterality Date  . ABDOMINAL HYSTERECTOMY  08/1998   BSO fibroids  . CHOLECYSTECTOMY  8/12  . COLON SURGERY    . HERNIA REPAIR  11/2003   RIH (ingram)    Family History  Problem Relation Age of Onset  . Stroke Father   . Heart disease Brother 53       recent MI  . Colon polyps Neg Hx   . Stomach cancer Neg Hx     Social History   Social History  . Marital status: Married    Spouse name: N/A  . Number of children: N/A  . Years of education: N/A   Occupational History  . Housekeeper [employer]   Social History Main Topics  . Smoking status: Never Smoker  . Smokeless tobacco: Never Used  . Alcohol use No  . Drug use: No  . Sexual activity: Not on file   Other Topics Concern  . Not on file   Social History Narrative  . No  narrative on file   Review of Systems  Constitutional: Negative for fatigue and unexpected weight change.       Wears seat belt  HENT: Negative for hearing loss and tinnitus.        Overdue for dentist  Eyes: Negative for visual disturbance.       No diplopia or unilateral vision loss  Respiratory: Negative for cough, chest tightness and shortness of breath.   Cardiovascular: Negative for chest pain, palpitations and leg swelling.  Gastrointestinal: Negative for abdominal pain, blood in stool and constipation.  Endocrine: Negative for polydipsia and polyuria.  Genitourinary: Negative for dysuria and hematuria.  Musculoskeletal: Positive for back pain. Negative for arthralgias and joint swelling.  Skin: Negative for rash.       Scattered benign spots--checked  Allergic/Immunologic: Negative for environmental allergies and immunocompromised state.  Neurological: Negative for dizziness, syncope and light-headedness.       Rare stress headaches  Hematological: Negative for adenopathy. Does not bruise/bleed easily.  Psychiatric/Behavioral: Negative for dysphoric mood and sleep disturbance. The patient is not nervous/anxious.        Uses 1/2 alprazolam at night--rarely at other times       Objective:   Physical Exam  Constitutional: She is oriented to person, place, and time. She appears well-developed and well-nourished. No distress.  HENT:  Head: Normocephalic and atraumatic.  Right Ear: External ear normal.  Left Ear: External ear normal.  Mouth/Throat: Oropharynx is clear and moist. No oropharyngeal exudate.  Eyes: Conjunctivae are normal. Pupils are equal, round, and reactive to light.  Neck: Normal range of motion. Neck supple. No thyromegaly present.  Cardiovascular: Normal rate, regular rhythm, normal heart sounds and intact distal pulses.  Exam reveals no gallop.   No murmur heard. Pulmonary/Chest: Effort normal and breath sounds normal. No respiratory distress. She has no  wheezes. She has no rales.  Abdominal: Soft. There is no tenderness.  Musculoskeletal: She exhibits no edema or tenderness.  Lymphadenopathy:    She has no cervical adenopathy.  Neurological: She is alert and oriented to person, place, and time.  Skin:  Hypopigmented spot by past cryotherapy No actinics or worrisome lesions  Psychiatric: She has a normal mood and affect. Her behavior is normal.          Assessment & Plan:

## 2017-06-13 NOTE — Assessment & Plan Note (Signed)
Colon only if positive FIT

## 2017-06-23 ENCOUNTER — Other Ambulatory Visit (INDEPENDENT_AMBULATORY_CARE_PROVIDER_SITE_OTHER): Payer: Managed Care, Other (non HMO)

## 2017-06-23 DIAGNOSIS — D126 Benign neoplasm of colon, unspecified: Secondary | ICD-10-CM

## 2017-06-23 LAB — FECAL OCCULT BLOOD, IMMUNOCHEMICAL: FECAL OCCULT BLD: NEGATIVE

## 2017-06-26 ENCOUNTER — Other Ambulatory Visit: Payer: Self-pay | Admitting: Internal Medicine

## 2017-06-26 NOTE — Telephone Encounter (Signed)
Last filled 05-25-17 #60 Last OV 06-13-17 Next OV 06-19-18  No UDS

## 2017-06-26 NOTE — Telephone Encounter (Signed)
Approved: #[phone] x 0 

## 2017-06-26 NOTE — Telephone Encounter (Signed)
Left refill on voice mail at pharmacy  

## 2017-06-27 ENCOUNTER — Encounter: Payer: Self-pay | Admitting: *Deleted

## 2017-08-01 ENCOUNTER — Other Ambulatory Visit: Payer: Self-pay | Admitting: Internal Medicine

## 2017-08-02 NOTE — Telephone Encounter (Signed)
Last filled 06-26-17 #60 Last OV 06-15-17 Next OV 06-19-18

## 2017-08-02 NOTE — Telephone Encounter (Signed)
Approved: #[phone] x 0 

## 2017-08-02 NOTE — Telephone Encounter (Signed)
Left refill on voice mail at pharmacy  

## 2017-08-12 ENCOUNTER — Other Ambulatory Visit: Payer: Self-pay | Admitting: Internal Medicine

## 2017-08-14 ENCOUNTER — Other Ambulatory Visit: Payer: Self-pay | Admitting: Internal Medicine

## 2017-08-27 ENCOUNTER — Other Ambulatory Visit: Payer: Self-pay | Admitting: Internal Medicine

## 2017-08-28 NOTE — Telephone Encounter (Signed)
Last Rx 08/02/2017. Last OV 05/2017

## 2017-08-28 NOTE — Telephone Encounter (Signed)
Approved: #[phone] x 0 

## 2017-08-29 NOTE — Telephone Encounter (Signed)
Rx called in to requested pharmacy 

## 2017-09-27 ENCOUNTER — Other Ambulatory Visit: Payer: Self-pay | Admitting: Internal Medicine

## 2017-09-27 NOTE — Telephone Encounter (Addendum)
Last filled 08-29-17 #60 Last OV 06-15-17 Next OV 06-19-18

## 2017-09-27 NOTE — Telephone Encounter (Signed)
Approved: #[phone] x 0 

## 2017-09-27 NOTE — Telephone Encounter (Signed)
Left refill on voice mail at pharmacy  

## 2017-10-27 ENCOUNTER — Other Ambulatory Visit: Payer: Self-pay | Admitting: Internal Medicine

## 2017-10-27 NOTE — Telephone Encounter (Signed)
Left refill on voice mail at pharmacy  

## 2017-10-27 NOTE — Telephone Encounter (Signed)
Last filled 09-27-17 #60 Last OV 06-13-17 Next OV 06-19-18

## 2017-10-27 NOTE — Telephone Encounter (Signed)
Approved: #[phone] x 0 

## 2017-11-27 ENCOUNTER — Other Ambulatory Visit: Payer: Self-pay | Admitting: Internal Medicine

## 2017-11-29 NOTE — Telephone Encounter (Signed)
Left refill on voice mail at pharmacy  

## 2017-11-29 NOTE — Telephone Encounter (Signed)
Approved: #[phone] x 0 

## 2017-11-29 NOTE — Telephone Encounter (Signed)
Last filled 10-27-17 #60 Last OV 06-13-17 Next OV 06-19-18

## 2017-12-07 ENCOUNTER — Ambulatory Visit: Payer: Managed Care, Other (non HMO) | Admitting: Internal Medicine

## 2017-12-07 ENCOUNTER — Encounter: Payer: Self-pay | Admitting: Internal Medicine

## 2017-12-07 VITALS — BP 136/86 | HR 67 | Temp 97.9°F | Wt 185.0 lb

## 2017-12-07 DIAGNOSIS — I10 Essential (primary) hypertension: Secondary | ICD-10-CM | POA: Diagnosis not present

## 2017-12-07 MED ORDER — LOSARTAN POTASSIUM 25 MG PO TABS: 25.0000 mg | ORAL_TABLET | Freq: Every day | ORAL | 3 refills | Status: DC

## 2017-12-07 NOTE — Assessment & Plan Note (Signed)
BP Readings from Last 3 Encounters:  12/07/17 136/86  06/13/17 132/88  02/02/17 106/80   Repeat on right 138/100 She has concerning symptoms of dizziness and headache which she feels are from elevated BP. With strong FH of vascular disease, she wants Rx Will give low dose losartan Recheck (with her machine also) in 2 months

## 2017-12-07 NOTE — Progress Notes (Signed)
   Subjective:    Patient ID: [name], female    DOB: November 01, 1956, 61 y.o.   MRN: [id number]  HPI Here with concerns about her blood pressure  Got dizzy the other day--had to hold on Then headache after Checked BP when she got home and sat down Multiple measurements last night and this morning 149/? This morning------ 150/90-149/86 last night Feels anxious at times--wonders if this is related to BP  Strong FH of strokes, etc Is concerned and wants to be proactive  Current Outpatient Medications on File Prior to Visit  Medication Sig Dispense Refill  . ALPRAZolam (XANAX) 0.5 MG tablet TAKE 1/2 TO 1 TABLET BY MOUTH TWICE A DAY AS NEEDED FOR ANXIETY. 60 tablet 0  . Calcium Carb-Cholecalciferol (CALCIUM 600 + D) 600-200 MG-UNIT TABS Take 3 tablets by mouth daily.    Marland Kitchen estradiol (ESTRACE) 1 MG tablet TAKE 1 TABLET BY MOUTH DAILY 30 tablet 10  . MAGNESIUM PO Take by mouth.    . psyllium (METAMUCIL) 58.6 % powder Take 1 packet by mouth daily.     No current facility-administered medications on file prior to visit.     No Known Allergies  Past Medical History:  Diagnosis Date  . Allergic rhinitis due to pollen   . Anxiety   . Depression   . GERD (gastroesophageal reflux disease)     Past Surgical History:  Procedure Laterality Date  . ABDOMINAL HYSTERECTOMY  08/1998   BSO fibroids  . CHOLECYSTECTOMY  8/12  . COLON SURGERY    . HERNIA REPAIR  11/2003   RIH (ingram)    Family History  Problem Relation Age of Onset  . Stroke Father   . Heart disease Brother 2       recent MI  . Colon polyps Neg Hx   . Stomach cancer Neg Hx     Social History   Socioeconomic History  . Marital status: Married    Spouse name: Not on file  . Number of children: Not on file  . Years of education: Not on file  . Highest education level: Not on file  Social Needs  . Financial resource strain: Not on file  . Food insecurity - worry: Not on file  . Food insecurity - inability: Not  on file  . Transportation needs - medical: Not on file  . Transportation needs - non-medical: Not on file  Occupational History  . Occupation: TEFL teacher: Runner, broadcasting/film/video  Tobacco Use  . Smoking status: Never Smoker  . Smokeless tobacco: Never Used  Substance and Sexual Activity  . Alcohol use: No  . Drug use: No  . Sexual activity: Not on file  Other Topics Concern  . Not on file  Social History Narrative  . Not on file   Review of Systems  Lots of stress with husband's needs--constant Tries to eat healthy diet     Objective:   Physical Exam  Constitutional: She appears well-developed. No distress.  Neck: No thyromegaly present.  Cardiovascular: Normal rate, regular rhythm and normal heart sounds. Exam reveals no gallop.  No murmur heard. Pulmonary/Chest: Effort normal and breath sounds normal. No respiratory distress. She has no wheezes. She has no rales.  Musculoskeletal: She exhibits no edema.  Lymphadenopathy:    She has no cervical adenopathy.          Assessment & Plan:

## 2017-12-07 NOTE — Patient Instructions (Signed)
Bring in your blood pressure machine to the next appointment.   DASH Eating Plan DASH stands for "Dietary Approaches to Stop Hypertension." The DASH eating plan is a healthy eating plan that has been shown to reduce high blood pressure (hypertension). It may also reduce your risk for type 2 diabetes, heart disease, and stroke. The DASH eating plan may also help with weight loss. What are tips for following this plan? General guidelines  Avoid eating more than 2,300 mg (milligrams) of salt (sodium) a day. If you have hypertension, you may need to reduce your sodium intake to 1,500 mg a day.  Limit alcohol intake to no more than 1 drink a day for nonpregnant women and 2 drinks a day for men. One drink equals 12 oz of beer, 5 oz of wine, or 1 oz of hard liquor.  Work with your health care provider to maintain a healthy body weight or to lose weight. Ask what an ideal weight is for you.  Get at least 30 minutes of exercise that causes your heart to beat faster (aerobic exercise) most days of the week. Activities may include walking, swimming, or biking.  Work with your health care provider or diet and nutrition specialist (dietitian) to adjust your eating plan to your individual calorie needs. Reading food labels  Check food labels for the amount of sodium per serving. Choose foods with less than 5 percent of the Daily Value of sodium. Generally, foods with less than 300 mg of sodium per serving fit into this eating plan.  To find whole grains, look for the word "whole" as the first word in the ingredient list. Shopping  Buy products labeled as "low-sodium" or "no salt added."  Buy fresh foods. Avoid canned foods and premade or frozen meals. Cooking  Avoid adding salt when cooking. Use salt-free seasonings or herbs instead of table salt or sea salt. Check with your health care provider or pharmacist before using salt substitutes.  Do not fry foods. Cook foods using healthy methods such as  baking, boiling, grilling, and broiling instead.  Cook with heart-healthy oils, such as olive, canola, soybean, or sunflower oil. Meal planning   Eat a balanced diet that includes: ? 5 or more servings of fruits and vegetables each day. At each meal, try to fill half of your plate with fruits and vegetables. ? Up to 6-8 servings of whole grains each day. ? Less than 6 oz of lean meat, poultry, or fish each day. A 3-oz serving of meat is about the same size as a deck of cards. One egg equals 1 oz. ? 2 servings of low-fat dairy each day. ? A serving of nuts, seeds, or beans 5 times each week. ? Heart-healthy fats. Healthy fats called Omega-3 fatty acids are found in foods such as flaxseeds and coldwater fish, like sardines, salmon, and mackerel.  Limit how much you eat of the following: ? Canned or prepackaged foods. ? Food that is high in trans fat, such as fried foods. ? Food that is high in saturated fat, such as fatty meat. ? Sweets, desserts, sugary drinks, and other foods with added sugar. ? Full-fat dairy products.  Do not salt foods before eating.  Try to eat at least 2 vegetarian meals each week.  Eat more home-cooked food and less restaurant, buffet, and fast food.  When eating at a restaurant, ask that your food be prepared with less salt or no salt, if possible. What foods are recommended? The items listed  may not be a complete list. Talk with your dietitian about what dietary choices are best for you. Grains Whole-grain or whole-wheat bread. Whole-grain or whole-wheat pasta. Brown rice. [place] Morrow. Bulgur. Whole-grain and low-sodium cereals. Pita bread. Low-fat, low-sodium crackers. Whole-wheat flour tortillas. Vegetables Fresh or frozen vegetables (raw, steamed, roasted, or grilled). Low-sodium or reduced-sodium tomato and vegetable juice. Low-sodium or reduced-sodium tomato sauce and tomato paste. Low-sodium or reduced-sodium canned vegetables. Fruits All fresh,  dried, or frozen fruit. Canned fruit in natural juice (without added sugar). Meat and other protein foods Skinless chicken or [place]. Ground chicken or [place]. Pork with fat trimmed off. Fish and seafood. Egg whites. Dried beans, peas, or lentils. Unsalted nuts, nut butters, and seeds. Unsalted canned beans. Lean cuts of beef with fat trimmed off. Low-sodium, lean deli meat. Dairy Low-fat (1%) or fat-free (skim) milk. Fat-free, low-fat, or reduced-fat cheeses. Nonfat, low-sodium ricotta or cottage cheese. Low-fat or nonfat yogurt. Low-fat, low-sodium cheese. Fats and oils Soft margarine without trans fats. Vegetable oil. Low-fat, reduced-fat, or light mayonnaise and salad dressings (reduced-sodium). Canola, safflower, olive, soybean, and sunflower oils. Avocado. Seasoning and other foods Herbs. Spices. Seasoning mixes without salt. Unsalted popcorn and pretzels. Fat-free sweets. What foods are not recommended? The items listed may not be a complete list. Talk with your dietitian about what dietary choices are best for you. Grains Baked goods made with fat, such as croissants, muffins, or some breads. Dry pasta or rice meal packs. Vegetables Creamed or fried vegetables. Vegetables in a cheese sauce. Regular canned vegetables (not low-sodium or reduced-sodium). Regular canned tomato sauce and paste (not low-sodium or reduced-sodium). Regular tomato and vegetable juice (not low-sodium or reduced-sodium). Angie Fava. Olives. Fruits Canned fruit in a light or heavy syrup. Fried fruit. Fruit in cream or butter sauce. Meat and other protein foods Fatty cuts of meat. Ribs. Fried meat. Berniece Salines. Sausage. Bologna and other processed lunch meats. Salami. Fatback. Hotdogs. Bratwurst. Salted nuts and seeds. Canned beans with added salt. Canned or smoked fish. Whole eggs or egg yolks. Chicken or [place] with skin. Dairy Whole or 2% milk, cream, and half-and-half. Whole or full-fat cream cheese. Whole-fat or sweetened  yogurt. Full-fat cheese. Nondairy creamers. Whipped toppings. Processed cheese and cheese spreads. Fats and oils Butter. Stick margarine. Lard. Shortening. Ghee. Bacon fat. Tropical oils, such as coconut, palm kernel, or palm oil. Seasoning and other foods Salted popcorn and pretzels. Onion salt, garlic salt, seasoned salt, table salt, and sea salt. Worcestershire sauce. Tartar sauce. Barbecue sauce. Teriyaki sauce. Soy sauce, including reduced-sodium. Steak sauce. Canned and packaged gravies. Fish sauce. Oyster sauce. Cocktail sauce. Horseradish that you find on the shelf. Ketchup. Mustard. Meat flavorings and tenderizers. Bouillon cubes. Hot sauce and Tabasco sauce. Premade or packaged marinades. Premade or packaged taco seasonings. Relishes. Regular salad dressings. Where to find more information:  National Heart, Lung, and [place]: https://wilson-eaton.com/  American Heart Association: www.heart.org Summary  The DASH eating plan is a healthy eating plan that has been shown to reduce high blood pressure (hypertension). It may also reduce your risk for type 2 diabetes, heart disease, and stroke.  With the DASH eating plan, you should limit salt (sodium) intake to 2,300 mg a day. If you have hypertension, you may need to reduce your sodium intake to 1,500 mg a day.  When on the DASH eating plan, aim to eat more fresh fruits and vegetables, whole grains, lean proteins, low-fat dairy, and heart-healthy fats.  Work with your health care provider or diet  and nutrition specialist (dietitian) to adjust your eating plan to your individual calorie needs. This information is not intended to replace advice given to you by your health care provider. Make sure you discuss any questions you have with your health care provider. Document Released: 11/24/2011 Document Revised: 11/28/2016 Document Reviewed: 11/28/2016 Elsevier Interactive Patient Education  [employer].

## 2018-01-01 ENCOUNTER — Other Ambulatory Visit: Payer: Self-pay | Admitting: Internal Medicine

## 2018-01-01 NOTE — Telephone Encounter (Signed)
Left refill on voice mail at pharmacy  

## 2018-01-01 NOTE — Telephone Encounter (Signed)
Last filled 11-29-17 #60  Last OV 12-07-17 Next OV 02-06-18 (Placed it as normal in case it can go electronically)

## 2018-01-15 ENCOUNTER — Telehealth: Payer: Self-pay | Admitting: Internal Medicine

## 2018-01-15 NOTE — Telephone Encounter (Signed)
I spoke with pt; non prod cough,fever 102,achy all over; so achy does not want to get out of bed.No S/T or earache. Pt was exposed to the flu by her granddaughter.Pt does not think she can come in for appt; pt said she cannot get out of her PJs. [facility] [employer]

## 2018-01-15 NOTE — Telephone Encounter (Signed)
Spoke to pt. The granddaughter was not diagnosed with the flu, just a virus. She said if she does not improve in the next few days, she will try to come in.

## 2018-01-15 NOTE — Telephone Encounter (Signed)
This sounds like the flu and amoxicillin won't help If her granddaughter had documented flu, I am okay with sending Rx for tamiflu 75 bid x 5 days

## 2018-01-15 NOTE — Telephone Encounter (Signed)
Copied from [place] [phone]. Topic: General - Other >> Jan 15, 2018  3:45 PM Cecelia Byars, NT wrote: Reason for CRM: Patient called and said last year she had the same symptom and want to call in her some amoxacillin at the [facility] please advise   [phone]

## 2018-01-17 ENCOUNTER — Ambulatory Visit: Payer: Managed Care, Other (non HMO) | Admitting: Internal Medicine

## 2018-01-25 ENCOUNTER — Telehealth: Payer: Self-pay | Admitting: Internal Medicine

## 2018-01-25 NOTE — Telephone Encounter (Signed)
Patient called [phone], left VM to call back to discuss the symptoms she's having.

## 2018-01-25 NOTE — Telephone Encounter (Signed)
Copied from [place] [phone]. Topic: Quick Communication - See Telephone Encounter >> Jan 25, 2018 11:56 AM Burnis Medin, NT wrote: CRM for notification. See Telephone encounter for: Patient called and wanted to see if the doctor can call her something in for a yeast infection. [id number] [place], [place] [phone] (Phone) [phone] (Fax)    01/25/18.

## 2018-01-30 ENCOUNTER — Other Ambulatory Visit: Payer: Self-pay | Admitting: Internal Medicine

## 2018-01-30 NOTE — Telephone Encounter (Signed)
Copied from [place] [phone]. Topic: Inquiry >> Jan 30, 2018  1:38 PM Pricilla Handler wrote: Reason for CRM: Patient called requesting a refill of ALPRAZolam (XANAX) 0.5 MG tablet. Patient called the pharmacy. Patient only has 2 pills left.  Patient's preferred pharmacy [id number] [place], [address] [employer] [phone] (Phone) [phone] (Fax).

## 2018-01-30 NOTE — Addendum Note (Signed)
Addended by: Pilar Grammes on: 01/30/2018 03:25 PM   Modules accepted: Orders

## 2018-01-30 NOTE — Telephone Encounter (Signed)
Xanax  Refill request - controlled substance  Last OV 12/07/17  [id number] [place] [place]

## 2018-01-30 NOTE — Telephone Encounter (Signed)
Last sent to pharmacy 01-01-18 #60

## 2018-01-31 MED ORDER — ALPRAZOLAM 0.5 MG PO TABS: ORAL_TABLET | ORAL | 0 refills | Status: DC

## 2018-01-31 NOTE — Addendum Note (Signed)
Addended by: Viviana Simpler I on: 01/31/2018 07:49 AM   Modules accepted: Orders

## 2018-02-06 ENCOUNTER — Encounter: Payer: Self-pay | Admitting: Internal Medicine

## 2018-02-06 ENCOUNTER — Ambulatory Visit (INDEPENDENT_AMBULATORY_CARE_PROVIDER_SITE_OTHER): Payer: Managed Care, Other (non HMO) | Admitting: Internal Medicine

## 2018-02-06 VITALS — BP 133/85 | HR 67 | Temp 97.7°F | Wt 184.5 lb

## 2018-02-06 DIAGNOSIS — I10 Essential (primary) hypertension: Secondary | ICD-10-CM

## 2018-02-06 DIAGNOSIS — F39 Unspecified mood [affective] disorder: Secondary | ICD-10-CM

## 2018-02-06 LAB — CBC
HEMATOCRIT: 43.8 % (ref 36.0–46.0)
HEMOGLOBIN: 14.8 g/dL (ref 12.0–15.0)
MCHC: 33.8 g/dL (ref 30.0–36.0)
MCV: 97.3 fl (ref 78.0–100.0)
Platelets: 254 10*3/uL (ref 150.0–400.0)
RBC: 4.5 Mil/uL (ref 3.87–5.11)
RDW: 12.6 % (ref 11.5–15.5)
WBC: 6.2 10*3/uL (ref 4.0–10.5)

## 2018-02-06 LAB — COMPREHENSIVE METABOLIC PANEL
ALK PHOS: 76 U/L (ref 39–117)
ALT: 11 U/L (ref 0–35)
AST: 14 U/L (ref 0–37)
Albumin: 3.8 g/dL (ref 3.5–5.2)
BUN: 11 mg/dL (ref 6–23)
CO2: 31 mEq/L (ref 19–32)
CREATININE: 0.71 mg/dL (ref 0.40–1.20)
Calcium: 9.3 mg/dL (ref 8.4–10.5)
Chloride: 104 mEq/L (ref 96–112)
GFR: 88.62 mL/min (ref >60.00)
GLUCOSE: 83 mg/dL (ref 70–99)
POTASSIUM: 4 meq/L (ref 3.5–5.1)
SODIUM: 138 meq/L (ref 135–145)
TOTAL PROTEIN: 6.9 g/dL (ref 6.0–8.3)
Total Bilirubin: 0.5 mg/dL (ref 0.2–1.2)

## 2018-02-06 LAB — LIPID PANEL
CHOLESTEROL: 149 mg/dL (ref 0–200)
HDL: 45.9 mg/dL (ref >39.00)
LDL CALC: 78 mg/dL (ref 0–99)
NonHDL: 103.01
Total CHOL/HDL Ratio: 3
Triglycerides: 124 mg/dL (ref 0.0–149.0)
VLDL: 24.8 mg/dL (ref 0.0–40.0)

## 2018-02-06 LAB — T4, FREE: FREE T4: 0.76 ng/dL (ref 0.60–1.60)

## 2018-02-06 NOTE — Progress Notes (Signed)
Subjective:    Patient ID: [name], female    DOB: [birth year], 62 y.o.   MRN: [id number]  HPI Here for follow up of HTN  No problems with the medication Home BP much better--- 128/78 and there abouts Her machine calibrated well with ours today  No chest pain No edema No SOB No dizziness  Current Outpatient Medications on File Prior to Visit  Medication Sig Dispense Refill  . ALPRAZolam (XANAX) 0.5 MG tablet TAKE 1/2 TO 1 TABLET BY MOUTH TWICE A DAY AS NEEDED FOR ANXIETY. 60 tablet 0  . Calcium Carb-Cholecalciferol (CALCIUM 600 + D) 600-200 MG-UNIT TABS Take 3 tablets by mouth daily.    Marland Kitchen estradiol (ESTRACE) 1 MG tablet TAKE 1 TABLET BY MOUTH DAILY 30 tablet 10  . losartan (COZAAR) 25 MG tablet Take 1 tablet (25 mg total) by mouth daily. 90 tablet 3  . MAGNESIUM PO Take by mouth.    . psyllium (METAMUCIL) 58.6 % powder Take 1 packet by mouth daily.     No current facility-administered medications on file prior to visit.     No Known Allergies  Past Medical History:  Diagnosis Date  . Allergic rhinitis due to pollen   . Anxiety   . Depression   . GERD (gastroesophageal reflux disease)     Past Surgical History:  Procedure Laterality Date  . ABDOMINAL HYSTERECTOMY  08/1998   BSO fibroids  . CHOLECYSTECTOMY  8/12  . COLON SURGERY    . HERNIA REPAIR  11/2003   RIH (ingram)    Family History  Problem Relation Age of Onset  . Stroke Father   . Heart disease Brother 13       recent MI  . Colon polyps Neg Hx   . Stomach cancer Neg Hx     Social History   Socioeconomic History  . Marital status: Married    Spouse name: Not on file  . Number of children: Not on file  . Years of education: Not on file  . Highest education level: Not on file  Social Needs  . Financial resource strain: Not on file  . Food insecurity - worry: Not on file  . Food insecurity - inability: Not on file  . Transportation needs - medical: Not on file  . Transportation needs -  non-medical: Not on file  Occupational History  . Occupation: TEFL teacher: Runner, broadcasting/film/video  Tobacco Use  . Smoking status: Never Smoker  . Smokeless tobacco: Never Used  Substance and Sexual Activity  . Alcohol use: No  . Drug use: No  . Sexual activity: Not on file  Other Topics Concern  . Not on file  Social History Narrative  . Not on file   Review of Systems  Has flu like illness--from sister Over this now--never had the fever or vomiting (just chills and fatigue) Panic spells are now worse--will be on floor and can't get up/vomiting,etc. Xanax will generally help but sometimes she forgets in the heat of the moment (chest pain and sweats, etc). Cold air may help also    Objective:   Physical Exam  Constitutional: She appears well-developed. No distress.  Neck: No thyromegaly present.  Cardiovascular: Normal rate, regular rhythm and normal heart sounds. Exam reveals no gallop.  No murmur heard. Pulmonary/Chest: Effort normal and breath sounds normal. No respiratory distress. She has no wheezes. She has no rales.  Musculoskeletal: She exhibits no edema.  Lymphadenopathy:    She  has no cervical adenopathy.          Assessment & Plan:
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
## 2018-02-06 NOTE — Assessment & Plan Note (Signed)
BP Readings from Last 3 Encounters:  02/06/18 133/85  12/07/17 136/86  06/13/17 132/88   Good control Our reading was actually lower--this is from her machine Doing well with the medication Will check labs

## 2018-02-24 ENCOUNTER — Other Ambulatory Visit: Payer: Self-pay | Admitting: Internal Medicine

## 2018-02-26 NOTE — Telephone Encounter (Signed)
Last filled 01-31-18 #60 Last OV 02-06-18 Next OV 06-19-18

## 2018-03-29 ENCOUNTER — Other Ambulatory Visit: Payer: Self-pay | Admitting: Internal Medicine

## 2018-03-29 NOTE — Telephone Encounter (Signed)
Last filled 02-27-18 #60 Last OV 02-06-18 Next OV 06-19-18

## 2018-04-13 ENCOUNTER — Telehealth: Payer: Self-pay | Admitting: Internal Medicine

## 2018-04-13 NOTE — Telephone Encounter (Signed)
Left message to call office

## 2018-04-13 NOTE — Telephone Encounter (Signed)
Pt called back, [name] was in lab so I spoke with pt. She said she is having more panic attacks than before, not daily but more frequent. Also, pt states they are more severe and sometimes she needs to take 2 when it is more severe. She takes them as soon as she feels a panic attack coming on. Another change is she has started vomiting when she gets these attacks, usually after taking med. She said she also needs them to sleep every night. Currently she has 11 pills left.

## 2018-04-13 NOTE — Telephone Encounter (Signed)
Copied from [place] [phone]. Topic: Quick Communication - See Telephone Encounter >> Apr 13, 2018  9:00 AM Conception Chancy, NT wrote: CRM for notification. See Telephone encounter for: 04/13/18.  Patient is calling and states she is needing 1 more tablet a day for ALPRAZolam (XANAX) 0.5 MG tablet. She states instead of 1 tablet twice a day as needed she would like it 3x a day as needed.please advise.  [id number], [place] - [address] [place] [address] Phone: [phone] Fax: [phone]

## 2018-04-13 NOTE — Telephone Encounter (Signed)
Spoke to pt. She said she will have to check her schedule and call back to schedule.

## 2018-04-13 NOTE — Telephone Encounter (Signed)
Unable to reach pt to get more info for need of increasing alprazolam.

## 2018-04-13 NOTE — Telephone Encounter (Signed)
This is a controlled substance so we will have to discuss this in the office. Set her up in same day early in the week

## 2018-04-23 ENCOUNTER — Telehealth: Payer: Self-pay | Admitting: *Deleted

## 2018-04-23 NOTE — Telephone Encounter (Signed)
This will need to be decided upon by PCP once he returns Thursday this week.

## 2018-04-23 NOTE — Telephone Encounter (Signed)
Copied from [place] [phone]. Topic: Quick Communication - See Telephone Encounter >> Apr 13, 2018  9:00 AM [place] Chancy, NT wrote: CRM for notification. See Telephone encounter for: 04/13/18.  Patient is calling and states she is needing 1 more tablet a day for ALPRAZolam (XANAX) 0.5 MG tablet. She states instead of 1 tablet twice a day as needed she would like it 3x a day as needed.please advise.  [id number], [place] [address] Phone: [phone] Fax: [phone]   >> Apr 23, 2018  3:45 PM Boyd Kerbs wrote: Pt. Said she will try to cut back some on xanex

## 2018-04-26 ENCOUNTER — Other Ambulatory Visit: Payer: Self-pay | Admitting: Internal Medicine

## 2018-04-26 NOTE — Telephone Encounter (Signed)
Last filled 4-11-9 #60 Last OV 02-06-18 Next OV 06-19-18

## 2018-04-27 ENCOUNTER — Encounter

## 2018-04-27 ENCOUNTER — Ambulatory Visit: Payer: Managed Care, Other (non HMO) | Admitting: Internal Medicine

## 2018-05-25 ENCOUNTER — Other Ambulatory Visit: Payer: Self-pay | Admitting: Internal Medicine

## 2018-05-25 NOTE — Telephone Encounter (Signed)
Last filled 04-26-18 #60 Last OV 02-06-18 Next OV 06-19-18

## 2018-06-19 ENCOUNTER — Ambulatory Visit (INDEPENDENT_AMBULATORY_CARE_PROVIDER_SITE_OTHER): Payer: Managed Care, Other (non HMO) | Admitting: Internal Medicine

## 2018-06-19 ENCOUNTER — Encounter: Payer: Self-pay | Admitting: Internal Medicine

## 2018-06-19 VITALS — BP 128/82 | HR 73 | Temp 98.1°F | Ht 65.5 in | Wt 185.0 lb

## 2018-06-19 DIAGNOSIS — Z1211 Encounter for screening for malignant neoplasm of colon: Secondary | ICD-10-CM | POA: Diagnosis not present

## 2018-06-19 DIAGNOSIS — I10 Essential (primary) hypertension: Secondary | ICD-10-CM | POA: Diagnosis not present

## 2018-06-19 DIAGNOSIS — F39 Unspecified mood [affective] disorder: Secondary | ICD-10-CM | POA: Diagnosis not present

## 2018-06-19 DIAGNOSIS — N951 Menopausal and female climacteric states: Secondary | ICD-10-CM

## 2018-06-19 DIAGNOSIS — Z0000 Encounter for general adult medical examination without abnormal findings: Secondary | ICD-10-CM | POA: Diagnosis not present

## 2018-06-19 NOTE — Assessment & Plan Note (Signed)
Chronic anxiety with some panic Trying to use the alprazolam prn only
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
## 2018-06-19 NOTE — Assessment & Plan Note (Signed)
Healthy but lots of stress Will check FIT till Medicare Mammogram every 2 years now--due 2020 No pap due to hyster Yearly flu vaccine Consider shingrix

## 2018-06-19 NOTE — Assessment & Plan Note (Signed)
Weaning off the estrogen

## 2018-06-19 NOTE — Assessment & Plan Note (Signed)
BP Readings from Last 3 Encounters:  06/19/18 128/82  02/06/18 133/85  12/07/17 136/86   Good control Recent labs okay

## 2018-06-19 NOTE — Patient Instructions (Signed)
Cut the estrogen in half and take 1/2 on Monday, Wednesday and Friday till they run out.

## 2018-06-24 ENCOUNTER — Other Ambulatory Visit: Payer: Self-pay | Admitting: Internal Medicine

## 2018-06-25 NOTE — Telephone Encounter (Signed)
Last filled 05-25-18 #60 Last OV 06-19-18 Next OV 09-03-19  [facility]

## 2018-07-11 ENCOUNTER — Other Ambulatory Visit (INDEPENDENT_AMBULATORY_CARE_PROVIDER_SITE_OTHER): Payer: Managed Care, Other (non HMO)

## 2018-07-11 DIAGNOSIS — Z1211 Encounter for screening for malignant neoplasm of colon: Secondary | ICD-10-CM | POA: Diagnosis not present

## 2018-07-12 LAB — FECAL OCCULT BLOOD, IMMUNOCHEMICAL: Fecal Occult Bld: NEGATIVE

## 2018-07-24 ENCOUNTER — Other Ambulatory Visit: Payer: Self-pay | Admitting: Internal Medicine

## 2018-07-24 NOTE — Telephone Encounter (Signed)
Last filled 06-25-18 #60 Last OV 06-19-18 Next OV 09-03-19

## 2018-08-24 ENCOUNTER — Other Ambulatory Visit: Payer: Self-pay | Admitting: Internal Medicine

## 2018-08-24 NOTE — Telephone Encounter (Signed)
Last filled 07-24-18 #60 Last OV 06-19-18 Next OV 09-03-19

## 2018-08-28 ENCOUNTER — Ambulatory Visit: Payer: Managed Care, Other (non HMO) | Admitting: Internal Medicine

## 2018-08-28 ENCOUNTER — Encounter: Payer: Self-pay | Admitting: Internal Medicine

## 2018-08-28 VITALS — BP 124/86 | HR 77 | Temp 98.1°F | Wt 186.0 lb

## 2018-08-28 DIAGNOSIS — R3915 Urgency of urination: Secondary | ICD-10-CM

## 2018-08-28 LAB — POC URINALSYSI DIPSTICK (AUTOMATED)
BILIRUBIN UA: NEGATIVE
GLUCOSE UA: NEGATIVE
Ketones, UA: NEGATIVE
Leukocytes, UA: NEGATIVE
Nitrite, UA: NEGATIVE
Protein, UA: NEGATIVE
RBC UA: NEGATIVE
Spec Grav, UA: 1.015 (ref 1.010–1.025)
UROBILINOGEN UA: 0.2 U/dL
pH, UA: 6 (ref 5.0–8.0)

## 2018-08-28 NOTE — Progress Notes (Signed)
HPI  Pt presents to the clinic today with c/o urinary urgency. She reports this started 1 week ago. She denies frequency, dysuria, blood in her urine, vaginal discharge, odor or abnormal bleeding. She denies fever, chills, nausea or low back pain. She has been increasing her fluid intake.    Review of Systems  Past Medical History:  Diagnosis Date  . Allergic rhinitis due to pollen   . Anxiety   . Depression   . GERD (gastroesophageal reflux disease)     Family History  Problem Relation Age of Onset  . Stroke Father   . Heart disease Brother 60       recent MI  . Heart disease Mother   . Colon polyps Neg Hx   . Stomach cancer Neg Hx     Social History   Socioeconomic History  . Marital status: Married    Spouse name: Not on file  . Number of children: Not on file  . Years of education: Not on file  . Highest education level: Not on file  Occupational History  . Occupation: TEFL teacher: [employer]  . Financial resource strain: Not on file  . Food insecurity:    Worry: Not on file    Inability: Not on file  . Transportation needs:    Medical: Not on file    Non-medical: Not on file  Tobacco Use  . Smoking status: Never Smoker  . Smokeless tobacco: Never Used  Substance and Sexual Activity  . Alcohol use: No  . Drug use: No  . Sexual activity: Not on file  Lifestyle  . Physical activity:    Days per week: Not on file    Minutes per session: Not on file  . Stress: Not on file  Relationships  . Social connections:    Talks on phone: Not on file    Gets together: Not on file    Attends religious service: Not on file    Active member of club or organization: Not on file    Attends meetings of clubs or organizations: Not on file    Relationship status: Not on file  . Intimate partner violence:    Fear of current or ex partner: Not on file    Emotionally abused: Not on file    Physically abused: Not on file    Forced sexual  activity: Not on file  Other Topics Concern  . Not on file  Social History Narrative  . Not on file    Allergies  Allergen Reactions  . Eggs Or Egg-Derived Products     REACTION: nausea     Constitutional: Denies fever, malaise, fatigue, headache or abrupt weight changes.   GU: Pt reports urgency. Denies frequency, dysuria, burning sensation, blood in urine, odor or discharge.   No other specific complaints in a complete review of systems (except as listed in HPI above).    Objective:   Physical Exam  BP 124/86   Pulse 77   Temp 98.1 F (36.7 C) (Oral)   Wt 186 lb (84.4 kg)   SpO2 97%   BMI 30.48 kg/m    Wt Readings from Last 3 Encounters:  08/28/18 186 lb (84.4 kg)  06/19/18 185 lb (83.9 kg)  02/06/18 184 lb 8 oz (83.7 kg)    General: Appears her stated age, well developed, well nourished in NAD. Abdomen: Soft and non tender. Normal bowel sounds. No distention or masses noted. No CVA tenderness.  Assessment & Plan:   Urgency:  Urinalysis: normal Will send urine culture Drink plenty of fluids  RTC as needed or if symptoms persist. Webb Silversmith, NP

## 2018-08-28 NOTE — Patient Instructions (Signed)
Interstitial Cystitis Interstitial cystitis is a condition that causes inflammation of the bladder. The bladder is a hollow organ in the lower part of your abdomen. It stores urine after the urine is made by your kidneys. With interstitial cystitis, you may have pain in the bladder area. You may also have a frequent and urgent need to urinate. The severity of interstitial cystitis can vary from person to person. You may have flare-ups of the condition, and then it may go away for a while. For many people who have this condition, it becomes a long-term problem. What are the causes? The cause of this condition is not known. What increases the risk? This condition is more likely to develop in women. What are the signs or symptoms? Symptoms of interstitial cystitis vary, and they can change over time. Symptoms may include:  Discomfort or pain in the bladder area. This can range from mild to severe. The pain may change in intensity as the bladder fills with urine or as it empties.  Pelvic pain.  An urgent need to urinate.  Frequent urination.  Pain during sexual intercourse.  Pinpoint bleeding on the bladder wall.  For women, the symptoms often get worse during menstruation. How is this diagnosed? This condition is diagnosed by evaluating your symptoms and ruling out other causes. A physical exam will be done. Various tests may be done to rule out other conditions. Common tests include:  Urine tests.  Cystoscopy. In this test, a tool that is like a very thin telescope is used to look into your bladder.  Biopsy. This involves taking a sample of tissue from the bladder wall to be examined under a microscope.  How is this treated? There is no cure for interstitial cystitis, but treatment methods are available to control your symptoms. Work closely with your health care provider to find the treatments that will be most effective for you. Treatment options may include:  Medicines to relieve  pain and to help reduce the number of times that you feel the need to urinate.  Bladder training. This involves learning ways to control when you urinate, such as: ? Urinating at scheduled times. ? Training yourself to delay urination. ? Doing exercises (Kegel exercises) to strengthen the muscles that control urine flow.  Lifestyle changes, such as changing your diet or taking steps to control stress.  Use of a device that provides electrical stimulation in order to reduce pain.  A procedure that stretches your bladder by filling it with air or fluid.  Surgery. This is rare. It is only done for extreme cases if other treatments do not help.  Follow these instructions at home:  Take medicines only as directed by your health care provider.  Use bladder training techniques as directed. ? Keep a bladder diary to find out which foods, liquids, or activities make your symptoms worse. ? Use your bladder diary to schedule bathroom trips. If you are away from home, plan to be near a bathroom at each of your scheduled times. ? Make sure you urinate just before you leave the house and just before you go to bed.  Do Kegel exercises as directed by your health care provider.  Do not drink alcohol.  Do not use any tobacco products, including cigarettes, chewing tobacco, or electronic cigarettes. If you need help quitting, ask your health care provider.  Make dietary changes as directed by your health care provider. You may need to avoid spicy foods and foods that contain a high amount  of potassium.  Limit your drinking of beverages that stimulate urination. These include soda, coffee, and tea.  Keep all follow-up visits as directed by your health care provider. This is important. Contact a health care provider if:  Your symptoms do not get better after treatment.  Your pain and discomfort are getting worse.  You have more frequent urges to urinate.  You have a fever. Get help right away  if:  You are not able to control your bladder at all. This information is not intended to replace advice given to you by your health care provider. Make sure you discuss any questions you have with your health care provider. Document Released: 08/05/2004 Document Revised: 05/12/2016 Document Reviewed: 08/12/2014 Elsevier Interactive Patient Education  [employer].

## 2018-08-29 LAB — URINE CULTURE
MICRO NUMBER:: 91081820
RESULT: NO GROWTH
SPECIMEN QUALITY: ADEQUATE

## 2018-09-12 ENCOUNTER — Encounter: Payer: Self-pay | Admitting: Internal Medicine

## 2018-09-12 ENCOUNTER — Ambulatory Visit: Payer: Managed Care, Other (non HMO) | Admitting: Internal Medicine

## 2018-09-12 VITALS — BP 110/84 | HR 63 | Temp 97.8°F | Ht 65.5 in | Wt 188.0 lb

## 2018-09-12 DIAGNOSIS — B019 Varicella without complication: Secondary | ICD-10-CM | POA: Diagnosis not present

## 2018-09-12 DIAGNOSIS — B029 Zoster without complications: Secondary | ICD-10-CM | POA: Diagnosis not present

## 2018-09-12 MED ORDER — VALACYCLOVIR HCL 1 G PO TABS: 1000.0000 mg | ORAL_TABLET | Freq: Three times a day (TID) | ORAL | 1 refills | Status: DC

## 2018-09-12 NOTE — Progress Notes (Signed)
Subjective:    Patient ID: [name], female    DOB: Oct 12, 1956, 62 y.o.   MRN: [id number]  HPI Here for painful rash---concerned about shingles  Started 2 nights ago "I can tell it is shingles"---did have it before Has rash also--- initially pink and itchy Then spread with other lesions  Used cream (TAC) and it helped itch  Current Outpatient Medications on File Prior to Visit  Medication Sig Dispense Refill  . ALPRAZolam (XANAX) 0.5 MG tablet TAKE 1/2 TO 1 TABLET BY MOUTH TWICE A DAY AS NEEDED FOR ANXIETY. 60 tablet 0  . Calcium Carb-Cholecalciferol (CALCIUM 600 + D) 600-200 MG-UNIT TABS Take 3 tablets by mouth daily.    Marland Kitchen estradiol (ESTRACE) 1 MG tablet TAKE 1 TABLET BY MOUTH DAILY 30 tablet 11  . losartan (COZAAR) 25 MG tablet Take 1 tablet (25 mg total) by mouth daily. 90 tablet 3  . MAGNESIUM PO Take by mouth.    . psyllium (METAMUCIL) 58.6 % powder Take 1 packet by mouth daily.     No current facility-administered medications on file prior to visit.     Allergies  Allergen Reactions  . Eggs Or Egg-Derived Products     REACTION: nausea    Past Medical History:  Diagnosis Date  . Allergic rhinitis due to pollen   . Anxiety   . Depression   . GERD (gastroesophageal reflux disease)     Past Surgical History:  Procedure Laterality Date  . ABDOMINAL HYSTERECTOMY  08/1998   BSO fibroids  . CHOLECYSTECTOMY  8/12  . COLON SURGERY    . HERNIA REPAIR  11/2003   RIH (ingram)    Family History  Problem Relation Age of Onset  . Stroke Father   . Heart disease Brother 55       recent MI  . Heart disease Mother   . Colon polyps Neg Hx   . Stomach cancer Neg Hx     Social History   Socioeconomic History  . Marital status: Married    Spouse name: Not on file  . Number of children: Not on file  . Years of education: Not on file  . Highest education level: Not on file  Occupational History  . Occupation: TEFL teacher: [employer]  . Financial resource strain: Not on file  . Food insecurity:    Worry: Not on file    Inability: Not on file  . Transportation needs:    Medical: Not on file    Non-medical: Not on file  Tobacco Use  . Smoking status: Never Smoker  . Smokeless tobacco: Never Used  Substance and Sexual Activity  . Alcohol use: No  . Drug use: No  . Sexual activity: Not on file  Lifestyle  . Physical activity:    Days per week: Not on file    Minutes per session: Not on file  . Stress: Not on file  Relationships  . Social connections:    Talks on phone: Not on file    Gets together: Not on file    Attends religious service: Not on file    Active member of club or organization: Not on file    Attends meetings of clubs or organizations: Not on file    Relationship status: Not on file  . Intimate partner violence:    Fear of current or ex partner: Not on file    Emotionally abused: Not on file  Physically abused: Not on file    Forced sexual activity: Not on file  Other Topics Concern  . Not on file  Social History Narrative  . Not on file   Review of Systems No fever Doesn't feel quite right---stress?    Objective:   Physical Exam  Skin:  Cluster of papules on red base (already broken) ~T6 on right near axilla 1 other lesion in same line           Assessment & Plan:

## 2018-09-12 NOTE — Assessment & Plan Note (Signed)
Rash and painful onset are fairly classic Will try valacyclovir for 1 week Doesn't need other pain meds at this point

## 2018-09-12 NOTE — Patient Instructions (Signed)

## 2018-09-21 ENCOUNTER — Other Ambulatory Visit: Payer: Self-pay | Admitting: Internal Medicine

## 2018-09-21 NOTE — Telephone Encounter (Signed)
Last filled 08-24-18 #60 Last OV 06-19-18 Next OV 09-03-19

## 2018-10-04 ENCOUNTER — Ambulatory Visit: Payer: Managed Care, Other (non HMO)

## 2018-10-16 ENCOUNTER — Ambulatory Visit: Payer: Self-pay

## 2018-10-16 NOTE — Telephone Encounter (Signed)
Patient called in with c/o "elevated BP." She says "I checked my BP this morning and it was 142/98, then a while later 150/97. I thought it was my machine, so I went and bought another one and checked it against my friend's machine. At noon today, my BP on her machine was 175/102. I haven't missed any medicine." I asked about other symptoms, she says "what's weird is I have this slight headache behind my neck and that's why I checked my BP this morning." According to protocol, see PCP within 3 days, no availability with PCP, appointment scheduled for tomorrow at 1530 with Clarene Reamer, FNP, care advice given, patient verbalized understanding.   Reason for Disposition . Systolic BP  >= 914 OR Diastolic >= 782  Answer Assessment - Initial Assessment Questions 1. BLOOD PRESSURE: "What is the blood pressure?" "Did you take at least two measurements 5 minutes apart?"     142/98, 150/97, Noon today-175/102 2. ONSET: "When did you take your blood pressure?"     This morning and noon today 3. HOW: "How did you obtain the blood pressure?" (e.g., visiting nurse, automatic home BP monitor)     Automatic home BP monitor 4. HISTORY: "Do you have a history of high blood pressure?"     Yes 5. MEDICATIONS: "Are you taking any medications for blood pressure?" "Have you missed any doses recently?"     No 6. OTHER SYMPTOMS: "Do you have any symptoms?" (e.g., headache, chest pain, blurred vision, difficulty breathing, weakness)     This morning had a slight headache behind my neck 7. PREGNANCY: "Is there any chance you are pregnant?" "When was your last menstrual period?"     No  Protocols used: HIGH BLOOD PRESSURE-A-AH

## 2018-10-17 ENCOUNTER — Encounter: Payer: Self-pay | Admitting: Family Medicine

## 2018-10-17 ENCOUNTER — Ambulatory Visit (INDEPENDENT_AMBULATORY_CARE_PROVIDER_SITE_OTHER): Payer: Managed Care, Other (non HMO) | Admitting: Family Medicine

## 2018-10-17 VITALS — BP 150/96 | HR 66 | Temp 98.0°F | Ht 65.5 in | Wt 189.0 lb

## 2018-10-17 DIAGNOSIS — I10 Essential (primary) hypertension: Secondary | ICD-10-CM

## 2018-10-17 NOTE — Progress Notes (Signed)
   Subjective:    Patient ID: [name], female    DOB: 03-23-1956, 62 y.o.   MRN: [id number]  HPI This is a 62 yo female who presents today with elevated blood pressure. Was checking at home and noticed elevated readings 142-175/97-102.  Five days ago she was having headaches. Has been waking up with headaches across back of neck. Ibuprofen 800 mg with a little relief. Some poor sleep. Took a xanax and was able to go to sleep. No change in vision, no chest pain, no SOB.  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   Past Medical History:  Diagnosis Date  . Allergic rhinitis due to pollen   . Anxiety   . Depression   . GERD (gastroesophageal reflux disease)    Past Surgical History:  Procedure Laterality Date  . ABDOMINAL HYSTERECTOMY  08/1998   BSO fibroids  . CHOLECYSTECTOMY  8/12  . COLON SURGERY    . HERNIA REPAIR  11/2003   RIH (ingram)   Family History  Problem Relation Age of Onset  . Stroke Father   . Heart disease Brother 4       recent MI  . Heart disease Mother   . Colon polyps Neg Hx   . Stomach cancer Neg Hx    Social History   Tobacco Use  . Smoking status: Never Smoker  . Smokeless tobacco: Never Used  Substance Use Topics  . Alcohol use: No  . Drug use: No      Review of Systems Per HPI    Objective:   Physical Exam Physical Exam  Constitutional: Oriented to person, place, and time. She appears well-developed and well-nourished.  HENT:  Head: Normocephalic and atraumatic.  Eyes: Conjunctivae are normal.  Neck: Normal range of motion. Neck supple.  Cardiovascular: Normal rate, regular rhythm and normal heart sounds.   Pulmonary/Chest: Effort normal and breath sounds normal.  Musculoskeletal: No LE edema.   Neurological: Alert and oriented to person, place, and time.  Skin: Skin is warm and dry.  Psychiatric: Normal mood and affect. Behavior is normal. Judgment and  thought content normal.  Vitals reviewed.     BP (!) 158/86 (BP Location: Left Arm, Patient Position: Sitting, Cuff Size: Normal)   Pulse 66   Temp 98 F (36.7 C) (Oral)   Ht 5' 5.5" (1.664 m)   Wt 189 lb (85.7 kg)   SpO2 98%   BMI 30.97 kg/m  Wt Readings from Last 3 Encounters:  10/17/18 189 lb (85.7 kg)  09/12/18 188 lb (85.3 kg)  08/28/18 186 lb (84.4 kg)   BP Readings from Last 3 Encounters:  10/17/18 (!) 158/86  09/12/18 110/84  08/28/18 124/86       Assessment & Plan:  1. Essential hypertension - she was instructed to increase losartan to 50 mg po qd - continue episodic home BP checks - follow up in 1 month, sooner if increased readings - encouraged self care/stress reduction   Clarene Reamer, FNP-BC  [facility], [place]  10/17/2018 9:00 PM

## 2018-10-17 NOTE — Patient Instructions (Addendum)
Please take two losartan instead of one, let me know if you need a refill of the higher dose  Follow up in 1 month for recheck of your blood pressure, check 1-2 times a week and keep a log to bring in with you for your appointment

## 2018-10-22 ENCOUNTER — Other Ambulatory Visit: Payer: Self-pay | Admitting: Internal Medicine

## 2018-10-22 NOTE — Telephone Encounter (Signed)
Last Filled 09-21-18 #60 Last CPE 06-19-18 Next OV 09-03-19

## 2018-11-03 ENCOUNTER — Other Ambulatory Visit: Payer: Self-pay | Admitting: Internal Medicine

## 2018-11-06 ENCOUNTER — Telehealth: Payer: Self-pay | Admitting: Internal Medicine

## 2018-11-06 MED ORDER — LOSARTAN POTASSIUM 50 MG PO TABS: 50.0000 mg | ORAL_TABLET | Freq: Every day | ORAL | 3 refills | Status: DC

## 2018-11-06 NOTE — Telephone Encounter (Signed)
Pt called office stating she was seen by Jackelyn Poling last week for her BP. Debbie advised the pt to increase the losartan to 2 tablets daily. Pt is out of medication and needs approval to refill the medication sooner. Please advise

## 2018-11-06 NOTE — Addendum Note (Signed)
Addended by: Pilar Grammes on: 11/06/2018 10:30 AM   Modules accepted: Orders

## 2018-11-06 NOTE — Telephone Encounter (Signed)
Should we change to 50mg ?

## 2018-11-06 NOTE — Telephone Encounter (Signed)
Spoke to pt. Sent in new rx 

## 2018-11-06 NOTE — Telephone Encounter (Signed)
Yes--send new Rx for 50mg  (1 year)

## 2018-11-21 ENCOUNTER — Ambulatory Visit: Payer: Managed Care, Other (non HMO) | Admitting: Family Medicine

## 2018-11-22 ENCOUNTER — Other Ambulatory Visit: Payer: Self-pay | Admitting: Internal Medicine

## 2018-11-22 MED ORDER — ALPRAZOLAM 1 MG PO TABS: 0.5000 mg | ORAL_TABLET | Freq: Two times a day (BID) | ORAL | 0 refills | Status: DC | PRN

## 2018-11-22 NOTE — Telephone Encounter (Signed)
Alprazolam 0.5mg  tab is on back order. Requires change to 1mg  because insurance will not cover large quantity of 0.25mg .

## 2018-12-21 ENCOUNTER — Other Ambulatory Visit: Payer: Self-pay | Admitting: Internal Medicine

## 2018-12-21 NOTE — Telephone Encounter (Signed)
The alprazolam back order has been resolved. Not sure if you want to put her back on 0.5mg  or stay on 1/2 1mg .  Last filled 11-22-18 #30 1mg  Last OV 10-17-18 Next OV 09-03-19 [facility]

## 2019-01-22 ENCOUNTER — Ambulatory Visit (INDEPENDENT_AMBULATORY_CARE_PROVIDER_SITE_OTHER): Payer: Managed Care, Other (non HMO) | Admitting: Family Medicine

## 2019-01-22 ENCOUNTER — Other Ambulatory Visit: Payer: Self-pay

## 2019-01-22 ENCOUNTER — Encounter: Payer: Self-pay | Admitting: Family Medicine

## 2019-01-22 VITALS — BP 140/84 | HR 70 | Temp 98.4°F | Ht 65.5 in | Wt 191.8 lb

## 2019-01-22 DIAGNOSIS — F39 Unspecified mood [affective] disorder: Secondary | ICD-10-CM | POA: Diagnosis not present

## 2019-01-22 DIAGNOSIS — I10 Essential (primary) hypertension: Secondary | ICD-10-CM

## 2019-01-22 DIAGNOSIS — M79675 Pain in left toe(s): Secondary | ICD-10-CM | POA: Diagnosis not present

## 2019-01-22 DIAGNOSIS — B373 Candidiasis of vulva and vagina: Secondary | ICD-10-CM

## 2019-01-22 DIAGNOSIS — B3731 Acute candidiasis of vulva and vagina: Secondary | ICD-10-CM

## 2019-01-22 MED ORDER — FLUCONAZOLE 150 MG PO TABS: 150.0000 mg | ORAL_TABLET | Freq: Once | ORAL | 0 refills | Status: DC

## 2019-01-22 MED ORDER — ALPRAZOLAM 0.5 MG PO TABS: ORAL_TABLET | ORAL | 0 refills | Status: DC

## 2019-01-22 MED ORDER — LOSARTAN POTASSIUM 25 MG PO TABS: 75.0000 mg | ORAL_TABLET | Freq: Every day | ORAL | 1 refills | Status: DC

## 2019-01-22 NOTE — Assessment & Plan Note (Signed)
BP elevated, Per patient request wanted to increase to Losartan 75 mg vs 100 mg. Home monitoring and PCP f/u in 4-6 weeks.

## 2019-01-22 NOTE — Progress Notes (Signed)
Subjective:     [name] is a 63 y.o. female presenting for Hypertension (b/p 2 weeks ago was 156/100, since then has checked her b/p some and readings have been 130s/80s-90.); Gout (in the left big toe. present 10 days. off and on. pain is present and swelling when flares up.); and Vaginal Itching (vaginal odor, itching, discharge. Symptoms started a week ago.)     HPI   #HTN - Losartan 50 mg daily - BP has been elevated at home - used to be 120/80 - then spiking high in December of last year - has been on losartan since 2018 - has had times where it has been very high - nervous about it b/c of family history - MI, stroke   #Toe pain - left big toe - started 10 days ago - pain off and on - pain and swelling when worse - gets red when painful and swollen - symptoms started 2 years ago and are intermittent - cherry juice w/o improvement - worse flare - not painful currently - will occur at rest with shooting pain - symptoms last 1 hour - improves on its own   #Vaginal itching - vaginal odor  - itching  - white discharge - symptoms started 1 week ago - no home treatment   Review of Systems  Constitutional: Negative for chills and fever.  Eyes: Negative for visual disturbance.  Respiratory: Negative for shortness of breath.   Cardiovascular: Negative for chest pain, palpitations and leg swelling.  Gastrointestinal: Negative for nausea and vomiting.  Genitourinary: Positive for vaginal discharge. Negative for difficulty urinating, dysuria, pelvic pain, vaginal bleeding and vaginal pain.  Neurological: Positive for headaches (gets occasionally, but not necessary associated w/ bp). Negative for numbness.     Social History   Tobacco Use  Smoking Status Never Smoker  Smokeless Tobacco Never Used        Objective:    BP Readings from Last 3 Encounters:  01/22/19 140/84  10/17/18 (!) 150/96  09/12/18 110/84   Wt Readings from Last 3 Encounters:    01/22/19 191 lb 12 oz (87 kg)  10/17/18 189 lb (85.7 kg)  09/12/18 188 lb (85.3 kg)    BP 140/84   Pulse 70   Temp 98.4 F (36.9 C)   Ht 5' 5.5" (1.664 m)   Wt 191 lb 12 oz (87 kg)   SpO2 98%   BMI 31.42 kg/m    Physical Exam Constitutional:      General: She is not in acute distress.    Appearance: She is well-developed. She is not diaphoretic.  HENT:     Right Ear: External ear normal.     Left Ear: External ear normal.     Nose: Nose normal.  Eyes:     Conjunctiva/sclera: Conjunctivae normal.  Neck:     Musculoskeletal: Neck supple.  Cardiovascular:     Rate and Rhythm: Normal rate and regular rhythm.     Heart sounds: No murmur.  Pulmonary:     Effort: Pulmonary effort is normal. No respiratory distress.     Breath sounds: Normal breath sounds. No wheezing.  Skin:    General: Skin is warm and dry.     Capillary Refill: Capillary refill takes less than 2 seconds.     Comments: Bilateral feet:  Inspection: no swelling or erythema Palpation: no TTP, no pain with compression of the great toe joint ROM: normal full active Strength: normal No abnormalities  Neurological:  Mental Status: She is alert. Mental status is at baseline.  Psychiatric:        Mood and Affect: Mood normal.        Behavior: Behavior normal.           Assessment & Plan:   Problem List Items Addressed This Visit      Cardiovascular and Mediastinum   Essential hypertension, benign - Primary    BP elevated, Per patient request wanted to increase to Losartan 75 mg vs 100 mg. Home monitoring and PCP f/u in 4-6 weeks.       Relevant Medications   losartan (COZAAR) 25 MG tablet     Other   Mood disorder ([place])    Long discussion about how her symptoms are not consistent with gout. Offered uric acid testing and XR but patient declined. Explained risks of taking preventive medication especially if exam not consistent with gout. If pain coming more often return. Ok for occasional  NSAID for pain relief      Great toe pain, left    Other Visit Diagnoses    Vaginal candidiasis       Relevant Medications   fluconazole (DIFLUCAN) 150 MG tablet     Deferred exam for vaginal discharge as pt symptoms consistent with prior which resolved with fluconazole. Return if symptoms not improved after fluconazole   Return in about 6 weeks (around 03/05/2019).  Lesleigh Noe, MD

## 2019-01-22 NOTE — Telephone Encounter (Signed)
Patient requesting refill for Xanax while she was seen Dr. Einar Pheasant. Sending for PCP to review

## 2019-01-22 NOTE — Patient Instructions (Signed)
#  High Blood pressure - Continue your regular exercise - Increase Losartan to 75 mg daily - make appointment to see Viviana Simpler I, MD in 4-6 weeks to follow-up on blood pressure - Check Blood pressure at home about 1 time a week - with feet flat on the ground for 5 minutes before checking, bring numbers to next appointment   #Toe pain - I do not think this gout, may be mild arthritis - can try occasionally Ibuprofen for pain - Gout - tends to be red, hot, swollen, painful, and consistent until it resolves   #Vaginal discharge - fluconazole  - if not better, return next week for further testing

## 2019-01-22 NOTE — Assessment & Plan Note (Signed)
Long discussion about how her symptoms are not consistent with gout. Offered uric acid testing and XR but patient declined. Explained risks of taking preventive medication especially if exam not consistent with gout. If pain coming more often return. [place] for occasional NSAID for pain relief

## 2019-01-22 NOTE — Telephone Encounter (Signed)
Last filled 12-21-18 Last OV today with Dr Einar Pheasant Next OV 03-05-19 Dr Silvio Pate [facility]

## 2019-02-14 ENCOUNTER — Other Ambulatory Visit: Payer: Self-pay | Admitting: Family Medicine

## 2019-02-21 ENCOUNTER — Other Ambulatory Visit: Payer: Self-pay | Admitting: Internal Medicine

## 2019-02-21 NOTE — Telephone Encounter (Signed)
Last filled 12-21-18 #60 Last OV 01-22-19 Next OV 03-05-19 [facility]

## 2019-02-22 ENCOUNTER — Telehealth: Payer: Self-pay | Admitting: Internal Medicine

## 2019-02-22 ENCOUNTER — Other Ambulatory Visit: Payer: Self-pay | Admitting: Internal Medicine

## 2019-02-22 DIAGNOSIS — Z1231 Encounter for screening mammogram for malignant neoplasm of breast: Secondary | ICD-10-CM

## 2019-02-22 NOTE — Telephone Encounter (Signed)
error 

## 2019-02-27 ENCOUNTER — Other Ambulatory Visit: Payer: Self-pay | Admitting: Internal Medicine

## 2019-03-05 ENCOUNTER — Ambulatory Visit: Payer: Managed Care, Other (non HMO) | Admitting: Internal Medicine

## 2019-03-22 ENCOUNTER — Other Ambulatory Visit: Payer: Self-pay | Admitting: Internal Medicine

## 2019-03-22 ENCOUNTER — Telehealth: Payer: Self-pay

## 2019-03-22 NOTE — Telephone Encounter (Signed)
Called pt about r/s her OV that was cancelled 03/05/19. She said her BP is doing better. She is monitoring it at home. Her job has changed and she is losing her insurance. Did not want to do an e-visit.

## 2019-03-25 ENCOUNTER — Ambulatory Visit: Payer: Managed Care, Other (non HMO)

## 2019-03-25 NOTE — Telephone Encounter (Signed)
Last filled 02/21/2019 #60 Last OV 01-22-19 Next OV 09-03-19 [facility]

## 2019-04-02 ENCOUNTER — Other Ambulatory Visit: Payer: Self-pay | Admitting: Family Medicine

## 2019-04-03 NOTE — Telephone Encounter (Signed)
[facility] that [facility] was refused and pt wants to know why. Pt should have available refill at [facility]. I spoke with Marjory Lies at [facility] and he said pt picked up losartan 25 mg # 90 on 02/28/19 and has one available refill for June. Pt voiced understanding and nothing further needed,

## 2019-04-16 ENCOUNTER — Other Ambulatory Visit: Payer: Self-pay | Admitting: Family Medicine

## 2019-04-16 DIAGNOSIS — B373 Candidiasis of vulva and vagina: Secondary | ICD-10-CM

## 2019-04-16 DIAGNOSIS — B3731 Acute candidiasis of vulva and vagina: Secondary | ICD-10-CM

## 2019-04-17 NOTE — Telephone Encounter (Signed)
Last prescribed by Dr. Einar Pheasant for an acute issue. Patient needs to discuss with her PCP-Dr. Silvio Pate.

## 2019-04-24 ENCOUNTER — Telehealth: Payer: Self-pay

## 2019-04-24 DIAGNOSIS — B3731 Acute candidiasis of vulva and vagina: Secondary | ICD-10-CM

## 2019-04-24 DIAGNOSIS — B373 Candidiasis of vulva and vagina: Secondary | ICD-10-CM

## 2019-04-24 MED ORDER — FLUCONAZOLE 150 MG PO TABS: 150.0000 mg | ORAL_TABLET | Freq: Once | ORAL | 0 refills | Status: AC

## 2019-04-24 NOTE — Telephone Encounter (Signed)
Spoke to pt

## 2019-04-24 NOTE — Telephone Encounter (Signed)
Pt said she usually gets vaginal discharge and itching twice a yr and pt thinks it is because she takes baths instead of showers. For 2 wks pt has vaginal discharge with vaginal and perineal itching.pt has not used OTC meds. [facility]. Last refilled 01/22/19 Dr Einar Pheasant deferred exam due to symptoms consistent with prior resolved with diflucan. Dr Silvio Pate prescribed diflucan 02/08/17.Please advise.

## 2019-04-24 NOTE — Telephone Encounter (Signed)
Let her know I sent the prescription. If that doesn't work, we may want to consider empiric Rx for vaginosis (like metrogel vaginal)

## 2019-04-26 ENCOUNTER — Other Ambulatory Visit: Payer: Self-pay | Admitting: Internal Medicine

## 2019-04-26 NOTE — Telephone Encounter (Signed)
Last filled 03-26-19 #60 Last OV 01-22-19 Next OV 09-03-19 [facility]

## 2019-04-27 ENCOUNTER — Other Ambulatory Visit: Payer: Self-pay | Admitting: Family Medicine

## 2019-04-30 ENCOUNTER — Other Ambulatory Visit: Payer: Self-pay | Admitting: Family Medicine

## 2019-04-30 NOTE — Telephone Encounter (Signed)
Pt wants to know if can get refill to [facility] for losartan 25 mg three tabs daily.  I spoke with Vicente Males pharmacist at [facility] and losartan is ready for pick up. Pt voiced understanding.

## 2019-05-07 ENCOUNTER — Ambulatory Visit: Payer: Managed Care, Other (non HMO)

## 2019-05-24 ENCOUNTER — Other Ambulatory Visit: Payer: Self-pay | Admitting: Internal Medicine

## 2019-05-27 NOTE — Telephone Encounter (Signed)
Last filled 04-26-19 #60 Last OV 01-22-19 Next OV 09-03-19 [facility]

## 2019-05-30 ENCOUNTER — Telehealth: Payer: Self-pay

## 2019-05-30 NOTE — Telephone Encounter (Signed)
If she only gets nausea with eggs, okay to give the vaccine

## 2019-05-30 NOTE — Telephone Encounter (Signed)
Note, I see patient has an allergy to eggs and egg derived products.  This is a contraindication to shingrix and I need to consult with Dr. Silvio Pate prior to scheduling.  Appears patient has nausea but no documented anaphylactic response.    Dr. Silvio Pate please advise.

## 2019-05-30 NOTE — Telephone Encounter (Signed)
Pt wants to be on the waiting list for Shingrix. Call [phone] to set up when ready.

## 2019-06-10 ENCOUNTER — Telehealth: Payer: Self-pay

## 2019-06-10 NOTE — Telephone Encounter (Signed)
Spoke to pt. She said she has taken care of the problem and if not better next week, she will make an OV.

## 2019-06-10 NOTE — Telephone Encounter (Signed)
Scabies comes on very slowly--not within 2 hours She could have itching from bug bites---and that should resolve over a few days (and can use some cortisone cream) Okay to set up virtual (or in office) visit for tomorrow if she thinks she needs additional care---I will add on 12, 12:15 and 12:30 if needed

## 2019-06-10 NOTE — Telephone Encounter (Signed)
[facility] Night - Client >>>Contains Verbal Order - Signature Required<<< TELEPHONE ADVICE RECORD AccessNurse Patient Name: [name] Gender: Female DOB: [birth year] Age: 63 Y 78 M 11 D Return Phone Number: [phone] (Primary), [phone] (Secondary) Address: City/State/Zip: [place] [address] Client [facility] Night - Client Client Site [facility] Physician Viviana Simpler - MD Contact Type Call Who Is Calling Patient / Member / Family / Caregiver Call Type Triage / Clinical Relationship To Patient Self Return Phone Number [phone] (Primary) Chief Complaint Rash - Widespread Reason for Call Symptomatic / Request for [place] states she visited a friend and started itching, she looked and it looked like bug bites. She thinks it is scabies all over her body. On legs, behind arm pits, everywhere. She also saw a bed bug while there. All of this showed up within 2 hours. Translation No Nurse Assessment Nurse: Dub Mikes, RN, Jenny Reichmann Date/Time (Eastern Time): 06/09/2019 10:08:02 AM Confirm and document reason for call. If symptomatic, describe symptoms. ---Caller states she visited a friend and started itching, she looked and it looked like bug bites. She thinks it is scabies all over her body. On legs, behind arm pits, legs, feet, hands. belly. She also saw a bed bug while there. Sat on the couch. All of this showed up within 2 hours. Bumps in a straight line in places. Has the patient had close contact with a person known or suspected to have the novel coronavirus illness OR traveled / lives in area with major community spread (including international travel) in the last 14 days from the onset of symptoms? * If Asymptomatic, screen for exposure and travel within the last 14 days. ---Not Applicable Does the patient have any new or worsening symptoms? ---Yes Will  a triage be completed? ---Yes Related visit to physician within the last 2 weeks? ---No Does the PT have any chronic conditions? (i.e. diabetes, asthma, this includes High risk factors for pregnancy, etc.) ---Yes List chronic conditions. ---HTN Is this a behavioral health or substance abuse call? ---No PLEASE NOTE: All timestamps contained within this report are represented as [place] Standard Time. CONFIDENTIALTY NOTICE: This fax transmission is intended only for the addressee. It contains information that is legally privileged, confidential or otherwise protected from use or disclosure. If you are not the intended recipient, you are strictly prohibited from reviewing, disclosing, copying using or disseminating any of this information or taking any action in reliance on or regarding this information. If you have received this fax in error, please notify us immediately by telephone so that we can arrange for its return to [place]. Phone: [phone], Toll-Free: [phone], Fax: [phone] Page: 2 of 4 Call Id: [id number] Guidelines Guideline Title Affirmed Question Affirmed Notes Nurse Date/Time Eilene Ghazi Time) Scabies Exposure [1] Scabies is suspected (very itchy, bumpy rash) AND [2] hasn't been diagnosed Beola Cord 06/09/2019 10:12:10 AM Disp. Time Eilene Ghazi Time) Disposition Final User 06/09/2019 10:21:06 AM Paged On Call back to [facility], [place], Jenny Reichmann 06/09/2019 10:32:14 AM SEE PCP WITHIN [place], RN, Cindy 06/09/2019 10:40:09 AM Pharmacy Call Dub Mikes, RN, Jenny Reichmann Reason: TO for Permethrin Cream 5% - Apply cream from neck to soles of feet, over entire body. Leave for 14 hours then shower off. Dispense 1 tube. 1 refill. Dr. Arnette Norris. 06/09/2019 10:40:19 AM Clinical Call Yes Dub Mikes, RN, [place] Disagree/Comply Comply Caller Understands Yes PreDisposition Call Doctor Care Advice Given Per Guideline SEE  PCP WITHIN 3 DAYS: * You need to be seen within 2 or 3  days. Call your doctor (or NP/PA) during regular office hours and make an appointment. A clinic or urgent care center are good places to go for care if your doctor's office is closed or you can't get an appointment. NOTE: If office will be open tomorrow, tell caller to call then, not in 3 days. SCABIES SUSPECTED: * Scabies are little bugs (mites) that burrow under the skin. * They are so tiny they can only be seen with a microscope. * They cause severe itching and little red bumps. * Scabies can't be diagnosed over the telephone. * You need to be checked by your doctor to confirm you have scabies. ITCHING AND SUSPECTED SCABIES: * Itching is usually the first symptom of scabies. * It can be very bothersome, even more so at night. * Here are some things you can do to relieve the itch until you are seen. DON'T SCRATCH: * Try not to scratch. * Scratching makes the itching worse (the 'Itch-Scratch' cycle). * Cut your fingernails short. Wash hands frequently with an antibacterial soap. This will help prevent a bacterial skin infection. AVOID ITCH TRIGGERS: * Avoid hot showers and baths (Reason: heat increases itching). * Avoid swimming pools (Reason: chlorine dries out the skin). * Avoid itchy or tight clothing (especially wool). * Avoid sleeping with too many blankets (Reason: heat and sweating can make itching worse). AVOID STRONG SOAPS: * Avoid all strong soaps (including bubble bath and scented soaps). (Reason: soaps remove natural oils from the skin.) Use gentler soaps like Dove, Olay, or Basis. * Adults only need soap for washing the armpits, groin, and feet. COOL BATH FOR FLARE-UP OF ITCHING: * For flare-ups of itching, take a cool bath without soap for 15 minutes, 1-2 times per day. * Pat dry using towel - do not rub. HYDROCORTISONE CREAM FOR ITCHING: * Put 1% hydrocortisone cream on the itchy area 3 times a day. Use it for a 5 days. This will help decrease the itching. * This is an over-the-counter  (OTC) drug. You can buy it at the drugstore. * Some people like to keep the cream in the refrigerator. It feels even better if the cream is used when it is cold. * CAUTION: Do not use hydrocortisone cream for more than 1 week without talking to your doctor. * Read the instructions and warnings on the package insert for all medicines you take. ANTIHISTAMINE FOR SEVERE ITCHING: * Take an antihistamine by mouth to reduce the itching. * Diphenhydramine (Benadryl) is a good choice. The adult dosage of Benadryl is 25-50 mg by mouth and you can take it up to 4 times a day. CAUTION-ANTIHISTAMINES: * E.g., diphenhydramine (Benadryl), chlorpheniramine (Chlortrimeton, Chlortripolon) AVOID CLOSE CONTACT: * Until you are seen, avoid close contact with others. * Close contact includes hugging and touching. It also includes sleeping in the same bed or sharing clothes or towels with others. CALL BACK IF: * Rash looks infected (such as spreading redness, tenderness, pus). * Fever occurs. * You become worse. CARE ADVICE given per Scabies Exposure (Adult) guideline. PLEASE NOTE: All timestamps contained within this report are represented as [place] Standard Time. CONFIDENTIALTY NOTICE: This fax transmission is intended only for the addressee. It contains information that is legally privileged, confidential or otherwise protected from use or disclosure. If you are not the intended recipient, you are strictly prohibited from reviewing, disclosing, copying using or disseminating any of this information or  taking any action in reliance on or regarding this information. If you have received this fax in error, please notify us immediately by telephone so that we can arrange for its return to [place]. Phone: [phone], Toll-Free: [phone], Fax: [phone] Page: 3 of 4 Call Id: [id number] Verbal Orders/Maintenance Medications Medication Refill Route Dosage Regime Duration Admin Instructions User Name Permetrin Topical 1  application 1 Days Apply cream from neck to soles of feet, over entire body. Leave for 14 hours. Then take a shower or bath. Dub Mikes, RN, Cindy Referrals GO TO FACILITY REFUSED GO TO FACILITY REFUSED Paging DoctorName Phone DateTime Result/Outcome Message Type Notes Arnette Norris- MD [phone] 06/09/2019 10:21:06 AM Paged On Call Back to Call Center Doctor Paged Please call me at [phone] Arnette Norris- MD 06/09/2019 10:42:19 AM Spoke with On Call - General Message Result Spoke with Dr. Deborra Medina. She gave a TO for Permethrin Cream 5%. Apply cream from neck to soles of feet, over entire body. Leave for 14 hours, then shower off. 1 refill. Called in to [facility] at [phone]. Pt. given instructions on how to apply cream and to call PCP in one week if no improvement.

## 2019-06-25 ENCOUNTER — Other Ambulatory Visit: Payer: Self-pay | Admitting: Internal Medicine

## 2019-07-03 NOTE — Telephone Encounter (Signed)
I have LM for patient to return my call.   Did not leave further details but I do need to discuss scheduling for shingrix administration and to review egg allergy information with her.

## 2019-07-04 ENCOUNTER — Telehealth: Payer: Self-pay

## 2019-07-04 NOTE — Telephone Encounter (Signed)
Left message to call office

## 2019-07-04 NOTE — Telephone Encounter (Signed)
Pt left v/m returning call to Jefm Petty or ? [place].

## 2019-07-04 NOTE — Telephone Encounter (Signed)
Best number [phone] Pt returned your call please call after 4:30 or she will be there all day  tomorrow

## 2019-07-05 ENCOUNTER — Telehealth: Payer: Self-pay | Admitting: Internal Medicine

## 2019-07-05 NOTE — Telephone Encounter (Signed)
When the pt called yesterday, I had not called her. I did call and leave a message for her as noted yesterday.

## 2019-07-05 NOTE — Telephone Encounter (Signed)
Spoke with patient. She states that she does not have any insurance at all and wonders if it would be cheaper to get at a pharmacy.  Yes, it would be cheaper as she would simply be charged the medication cost at the pharmacy and not for an administration fee which is additional at our office.   Patient did state that her egg allergy was in the past and mild.  She would like to move forward with the vaccine for shingrix and would like to get it from the pharmacy.   Dr. Silvio Pate,  Patient is questioning as to whether we can send the Shingrix r/x directly to the pharmacy for her and her husband electronically to save them a trip to the office to pick up a hard script.   I am unsure if vaccine prescriptions can be sent electronically. Do you know?  If so, can we send this for her and her husband?    Thanks.

## 2019-07-05 NOTE — Telephone Encounter (Signed)
Please refer to other phone note in regards to this response.

## 2019-07-05 NOTE — Telephone Encounter (Signed)
Patient called today in regards to getting the Shingrix vaccine. She stated she had spoke with you before about this. Patient is really wanting to have the vaccine done.  Please advise  C/B # [phone]

## 2019-07-07 NOTE — Telephone Encounter (Signed)
Yes---we can send the prescriptions for vaccines but they are not required---the pharmacists will give shingrix without an Rx--just like flu vaccines If needed, I can send though

## 2019-07-09 NOTE — Telephone Encounter (Signed)
Pt called to ck on status of shingrix. Pt notified as instructed and pt voiced understanding.

## 2019-07-24 ENCOUNTER — Other Ambulatory Visit: Payer: Self-pay | Admitting: Internal Medicine

## 2019-07-25 ENCOUNTER — Other Ambulatory Visit: Payer: Self-pay | Admitting: Internal Medicine

## 2019-07-25 NOTE — Telephone Encounter (Signed)
Last filled 06-26-19 #60 Last OV 01-22-19 Next OV 09-03-19 [facility]

## 2019-09-01 ENCOUNTER — Other Ambulatory Visit: Payer: Self-pay | Admitting: Internal Medicine

## 2019-09-02 NOTE — Telephone Encounter (Signed)
Last filled 07-25-19 #60 Last OV 01-22-19 Next OV 09-03-19 [facility]

## 2019-09-03 ENCOUNTER — Encounter: Payer: Self-pay | Admitting: Internal Medicine

## 2019-09-03 ENCOUNTER — Ambulatory Visit (INDEPENDENT_AMBULATORY_CARE_PROVIDER_SITE_OTHER): Payer: BLUE CROSS/BLUE SHIELD | Admitting: Internal Medicine

## 2019-09-03 ENCOUNTER — Other Ambulatory Visit: Payer: Self-pay

## 2019-09-03 VITALS — BP 124/84 | HR 78 | Temp 98.5°F | Ht 65.5 in | Wt 191.0 lb

## 2019-09-03 DIAGNOSIS — N951 Menopausal and female climacteric states: Secondary | ICD-10-CM | POA: Diagnosis not present

## 2019-09-03 DIAGNOSIS — I10 Essential (primary) hypertension: Secondary | ICD-10-CM

## 2019-09-03 DIAGNOSIS — Z1211 Encounter for screening for malignant neoplasm of colon: Secondary | ICD-10-CM | POA: Diagnosis not present

## 2019-09-03 DIAGNOSIS — Z0000 Encounter for general adult medical examination without abnormal findings: Secondary | ICD-10-CM | POA: Diagnosis not present

## 2019-09-03 LAB — RENAL FUNCTION PANEL
Albumin: 3.7 g/dL (ref 3.5–5.2)
BUN: 10 mg/dL (ref 6–23)
CO2: 30 mEq/L (ref 19–32)
Calcium: 9.1 mg/dL (ref 8.4–10.5)
Chloride: 104 mEq/L (ref 96–112)
Creatinine, Ser: 0.75 mg/dL (ref 0.40–1.20)
GFR: 77.88 mL/min (ref >60.00)
Glucose, Bld: 95 mg/dL (ref 70–99)
Phosphorus: 3.2 mg/dL (ref 2.3–4.6)
Potassium: 3.9 mEq/L (ref 3.5–5.1)
Sodium: 142 mEq/L (ref 135–145)

## 2019-09-03 MED ORDER — LOSARTAN POTASSIUM 100 MG PO TABS: 100.0000 mg | ORAL_TABLET | Freq: Every day | ORAL | 3 refills | Status: DC

## 2019-09-03 NOTE — Assessment & Plan Note (Signed)
BP Readings from Last 3 Encounters:  09/03/19 124/84  01/22/19 140/84  10/17/18 (!) 150/96   Good control Will change to the 100mg  for ease Check renal profile

## 2019-09-03 NOTE — Assessment & Plan Note (Signed)
Urged her to try to reduce dose --cut in half

## 2019-09-03 NOTE — Assessment & Plan Note (Signed)
Will do FIT again Due for mammogram She will get flu vaccine at pharmacy

## 2019-09-03 NOTE — Progress Notes (Signed)
Subjective:    Patient ID: [name], female    DOB: 1956-04-27, 63 y.o.   MRN: [id number]  HPI Here for follow up of HTN  Has retired---has been a good thing Her job moved to [place] for 6 months of the year--didn't work for her Mood is good Stays busy---did have trouble at first with [place] and retirement Gained 10# and now back down Walking every day  Still on estrogen every other day Discussed trying cutting them in half Menopausal symptoms do flare with reductions  No chest pain  No SOB Currently taking losartan 75mg  daily No orthostatic dizziness  Current Outpatient Medications on File Prior to Visit  Medication Sig Dispense Refill  . ALPRAZolam (XANAX) 0.5 MG tablet TAKE 1/2 TO 1 TABLET BY MOUTH TWICE A DAY AS NEEDED FOR ANXIETY 60 tablet 0  . Calcium Carb-Cholecalciferol (CALCIUM 600 + D) 600-200 MG-UNIT TABS Take 3 tablets by mouth daily.    Marland Kitchen estradiol (ESTRACE) 1 MG tablet TAKE 1 TABLET BY MOUTH DAILY 90 tablet 3  . losartan (COZAAR) 25 MG tablet Take 3 tablets by mouth once daily 90 tablet 3  . MAGNESIUM PO Take by mouth.    . psyllium (METAMUCIL) 58.6 % powder Take 1 packet by mouth daily.     No current facility-administered medications on file prior to visit.     No Known Allergies  Past Medical History:  Diagnosis Date  . Allergic rhinitis due to pollen   . Anxiety   . Depression   . GERD (gastroesophageal reflux disease)     Past Surgical History:  Procedure Laterality Date  . ABDOMINAL HYSTERECTOMY  08/1998   BSO fibroids  . CHOLECYSTECTOMY  8/12  . COLON SURGERY    . HERNIA REPAIR  11/2003   RIH (ingram)    Family History  Problem Relation Age of Onset  . Stroke Father   . Heart disease Brother 105       recent MI  . Heart disease Mother   . Colon polyps Neg Hx   . Stomach cancer Neg Hx     Social History   Socioeconomic History  . Marital status: Married    Spouse name: Not on file  . Number of children: Not on file  .  Years of education: Not on file  . Highest education level: Not on file  Occupational History  . 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 Financial resource strain: Not on file  . Food insecurity    Worry: Not on file    Inability: Not on file  . Transportation needs    Medical: Not on file    Non-medical: Not on file  Tobacco Use  . Smoking status: Never Smoker  . Smokeless tobacco: Never Used  Substance and Sexual Activity  . Alcohol use: No  . Drug use: No  . Sexual activity: Not on file  Lifestyle  . Physical activity    Days per week: Not on file    Minutes per session: Not on file  . Stress: Not on file  Relationships  . Social Herbalist on phone: Not on file    Gets together: Not on file    Attends religious service: Not on file    Active member of club or organization: Not on file    Attends meetings of clubs or organizations: Not on file    Relationship status: Not on file  .  Intimate partner violence    Fear of current or ex partner: Not on file    Emotionally abused: Not on file    Physically abused: Not on file    Forced sexual activity: Not on file  Other Topics Concern  . Not on file  Social History Narrative  . Not on file   Review of Systems Did go to eye doctor--got drops for early glaucoma Sleeping fine    Objective:   Physical Exam  Constitutional: She appears well-developed. No distress.  Neck: No thyromegaly present.  Cardiovascular: Normal rate, regular rhythm, normal heart sounds and intact distal pulses. Exam reveals no gallop.  No murmur heard. Respiratory: Effort normal and breath sounds normal. No respiratory distress. She has no wheezes. She has no rales.  GI: Soft. There is no abdominal tenderness.  Musculoskeletal:        General: No edema.  Lymphadenopathy:    She has no cervical adenopathy.  Psychiatric: She has a normal mood and affect. Her behavior is normal.            Assessment & Plan:

## 2019-09-10 ENCOUNTER — Telehealth: Payer: Self-pay | Admitting: *Deleted

## 2019-09-10 NOTE — Telephone Encounter (Signed)
Pt has already been taking 75mg  not 100mg  as Dr Silvio Pate thought. She says she was never taking 100mg .   Pt will contact us when she wants a new rx for Losartan 25mg  3 tablets daily sent to [facility].

## 2019-09-10 NOTE — Telephone Encounter (Signed)
Patient left a voicemail stating that she got a note about the dose of her blood pressure medication Losartan. Patient stated that she does have a problem with 100 mg and wants to only wants to take the 75 mg. Patient stated that she also wants it noted on her chart that she uses [facility] in [place].

## 2019-09-10 NOTE — Telephone Encounter (Signed)
Okay to decrease back to 75 mg Either 3 of the 25 mg or 1.5 of the 50mg 

## 2019-09-12 ENCOUNTER — Other Ambulatory Visit (INDEPENDENT_AMBULATORY_CARE_PROVIDER_SITE_OTHER): Payer: BLUE CROSS/BLUE SHIELD

## 2019-09-12 DIAGNOSIS — Z1211 Encounter for screening for malignant neoplasm of colon: Secondary | ICD-10-CM | POA: Diagnosis not present

## 2019-09-12 LAB — FECAL OCCULT BLOOD, IMMUNOCHEMICAL: Fecal Occult Bld: NEGATIVE

## 2019-10-03 ENCOUNTER — Other Ambulatory Visit: Payer: Self-pay | Admitting: Internal Medicine

## 2019-10-04 NOTE — Telephone Encounter (Signed)
Last filled 09-02-19 #60 Last OV 09-03-19 Next OV 09-02-20 [facility] [place]  Forwarding to [facility] in Dr Alla German absence

## 2019-10-14 ENCOUNTER — Telehealth: Payer: Self-pay

## 2019-10-14 ENCOUNTER — Other Ambulatory Visit: Payer: Self-pay | Admitting: Internal Medicine

## 2019-10-14 NOTE — Telephone Encounter (Signed)
Spoke to pt. [facility] and find out.

## 2019-10-14 NOTE — Telephone Encounter (Signed)
Okay to refill Can send now if needed (find out what size bottle she gets--then #5 refills)

## 2019-10-14 NOTE — Telephone Encounter (Signed)
Patient called and left a message on triage line asking if Dr Silvio Pate would be willing to take over RFs on Chlorxexidine rinse she uses and was prescribed by her dentist who went out of business due to [place]. Patient states this helps her from not getting abscess or ulcers in the back of her throat and this rinse is not as harsh as others but has to be prescribed. Chlorxexidine 0.12% rinse swish and spit with 1 capful twice daily as directed. Please review.

## 2019-10-15 NOTE — Telephone Encounter (Signed)
Pharmacy sent request.

## 2019-10-21 ENCOUNTER — Telehealth: Payer: Self-pay | Admitting: *Deleted

## 2019-10-21 MED ORDER — LOSARTAN POTASSIUM 25 MG PO TABS: 75.0000 mg | ORAL_TABLET | Freq: Every day | ORAL | 3 refills | Status: DC

## 2019-10-21 NOTE — Telephone Encounter (Signed)
Patient called stating that she was given Losartan 100 mg. Patient stated that she is not taking a 100 mg, but is taking a total of 75 mg. Patient stated that she has been cutting the 100 mg in half and taking half of that along with a 25 mg pill. Patient stated that she is out of the medication. Patient stated that she would like Losartan 50 mg and 25 mg pills sent in to the pharmacy  because it is hard to cut the 100 mg in half. Patient stated that she is okay with taking 3 of the 25 mg pills if that will be easier. [facility]

## 2019-10-21 NOTE — Telephone Encounter (Signed)
Please check with the pharmacist to find out the easiest way that insurance will pay---- 3 of the 25mg  would be easiest, or 1 and 1/2 of the 50 (or one 50mg  and one 25mg  daily

## 2019-10-21 NOTE — Telephone Encounter (Signed)
Spoke to [name]. [facility] the 50mg  and 25mg  in stock. [facility] a [facility]. I am sending new rx for 25mg  to [facility].

## 2019-11-04 ENCOUNTER — Other Ambulatory Visit: Payer: Self-pay

## 2019-11-04 MED ORDER — ALPRAZOLAM 0.5 MG PO TABS: ORAL_TABLET | ORAL | 0 refills | Status: DC

## 2019-11-04 NOTE — Telephone Encounter (Signed)
Name of Medication:alprazolam 0.5 mg  Name of [facility] due to less expensive than [facility]  Last Fill or Written Date and Quantity: # 4 on 10/04/19 Last Office Visit and Type: 09/03/19 yearly FU Next Office Visit and Type: 09/02/20 yearly FU

## 2019-11-28 ENCOUNTER — Telehealth: Payer: Self-pay | Admitting: Internal Medicine

## 2019-11-28 MED ORDER — ESTRADIOL 1 MG PO TABS: 1.0000 mg | ORAL_TABLET | Freq: Every day | ORAL | 3 refills | Status: DC

## 2019-11-28 NOTE — Telephone Encounter (Signed)
Rx sent electronically.  

## 2019-11-28 NOTE — Telephone Encounter (Signed)
Patient called and left a message on voicemail Requesting refills of Estradiol 90-day supply  [place], [place] [phone]

## 2019-12-02 ENCOUNTER — Other Ambulatory Visit: Payer: Self-pay | Admitting: Internal Medicine

## 2019-12-02 NOTE — Telephone Encounter (Signed)
Last filled 11-04-19 #60 Last OV 09-03-19 Next OV 09-02-20 [facility]

## 2020-01-06 ENCOUNTER — Other Ambulatory Visit: Payer: Self-pay | Admitting: Internal Medicine

## 2020-01-06 NOTE — Telephone Encounter (Signed)
Patient left a voicemail stating that she would like this refill done today if possible, so she will only have to go out once this week. Alprazolam Last refill 12/02/19 #60 Last office visit 09/03/19.

## 2020-01-09 ENCOUNTER — Other Ambulatory Visit: Payer: Self-pay | Admitting: Internal Medicine

## 2020-01-09 NOTE — Telephone Encounter (Signed)
Name of Medication: Alprazolam Name of [facility]acy: [facility] [place] Last Fill or Written Date and Quantity: 12/02/19, #60 Last Office Visit and Type: 09/03/19, CPE Next Office Visit and Type: 09/02/20, CPE Last Controlled Substance Agreement Date: none Last UDS:  none

## 2020-03-04 ENCOUNTER — Other Ambulatory Visit: Payer: Self-pay | Admitting: Internal Medicine

## 2020-03-04 NOTE — Telephone Encounter (Signed)
Last filled 02-08-20 #60 Last OV 09-03-19 Next OV 09-02-20 [name]

## 2020-04-03 ENCOUNTER — Other Ambulatory Visit: Payer: Self-pay | Admitting: Internal Medicine

## 2020-04-03 ENCOUNTER — Ambulatory Visit: Payer: BLUE CROSS/BLUE SHIELD | Attending: Internal Medicine

## 2020-04-03 DIAGNOSIS — Z23 Encounter for immunization: Secondary | ICD-10-CM

## 2020-04-03 DIAGNOSIS — Z1231 Encounter for screening mammogram for malignant neoplasm of breast: Secondary | ICD-10-CM

## 2020-04-03 NOTE — Progress Notes (Signed)
   Covid-19 Vaccination Clinic  Name:  [name]    MRN: [id number] DOB: March 21, 1956  04/03/2020  [name] was observed post Covid-19 immunization for 15 minutes without incident. She was provided with Vaccine Information Sheet and instruction to access the V-Safe system.   [name] was instructed to call 911 with any severe reactions post vaccine: Marland Kitchen Difficulty breathing  . Swelling of face and throat  . A fast heartbeat  . A bad rash all over body  . Dizziness and weakness   Immunizations Administered    Name Date Dose VIS Date Route   Pfizer COVID-19 Vaccine 04/03/2020  1:09 PM 0.3 mL 11/29/2019 Intramuscular   Manufacturer: [place]   Lot: [id number]   [place]: [id number]

## 2020-04-07 ENCOUNTER — Ambulatory Visit: Payer: BLUE CROSS/BLUE SHIELD

## 2020-04-10 ENCOUNTER — Other Ambulatory Visit: Payer: Self-pay | Admitting: Internal Medicine

## 2020-04-10 NOTE — Telephone Encounter (Signed)
Last filled 03-12-20 #60 Last OV 09-03-19 Next OV 09-02-20 [name]

## 2020-04-27 ENCOUNTER — Ambulatory Visit: Payer: BLUE CROSS/BLUE SHIELD | Attending: Internal Medicine

## 2020-04-27 DIAGNOSIS — Z23 Encounter for immunization: Secondary | ICD-10-CM

## 2020-04-27 NOTE — Progress Notes (Signed)
   Covid-19 Vaccination Clinic  Name:  [name]    MRN: [id number] DOB: Nov 15, 1956  04/27/2020  [name] was observed post Covid-19 immunization for 15 minutes without incident. She was provided with Vaccine Information Sheet and instruction to access the V-Safe system.   [name] was instructed to call 911 with any severe reactions post vaccine: Marland Kitchen Difficulty breathing  . Swelling of face and throat  . A fast heartbeat  . A bad rash all over body  . Dizziness and weakness   Immunizations Administered    Name Date Dose VIS Date Route   Pfizer COVID-19 Vaccine 04/27/2020 11:36 AM 0.3 mL 02/12/2019 Intramuscular   Manufacturer: [place]   Lot: [id number]   [place]: [id number]

## 2020-05-06 ENCOUNTER — Other Ambulatory Visit: Payer: Self-pay | Admitting: Internal Medicine

## 2020-05-06 NOTE — Telephone Encounter (Signed)
Last filled 04-10-20 #60 Last OV 09-03-19 Next OV 09-02-20 [facility]

## 2020-06-02 ENCOUNTER — Other Ambulatory Visit: Payer: Self-pay

## 2020-06-02 ENCOUNTER — Encounter: Payer: Self-pay | Admitting: Internal Medicine

## 2020-06-02 ENCOUNTER — Ambulatory Visit (INDEPENDENT_AMBULATORY_CARE_PROVIDER_SITE_OTHER): Payer: BLUE CROSS/BLUE SHIELD | Admitting: Internal Medicine

## 2020-06-02 DIAGNOSIS — M7662 Achilles tendinitis, left leg: Secondary | ICD-10-CM | POA: Insufficient documentation

## 2020-06-02 DIAGNOSIS — I10 Essential (primary) hypertension: Secondary | ICD-10-CM

## 2020-06-02 MED ORDER — LOSARTAN POTASSIUM-HCTZ 100-25 MG PO TABS
1.0000 | ORAL_TABLET | Freq: Every day | ORAL | 3 refills | Status: DC
Start: 2020-06-02 — End: 2020-09-02

## 2020-06-02 NOTE — Assessment & Plan Note (Signed)
With nodule Okay to use NSAID (ibuprofen) for pain Try heat/ice Will set up with Dr Lorelei Pont

## 2020-06-02 NOTE — Progress Notes (Signed)
Subjective:    Patient ID: [name], female    DOB: 1956-12-05, 64 y.o.   MRN: [id number]  HPI Here due to concern about her blood pressure This visit occurred during the SARS-CoV-2 public health emergency.  Safety protocols were in place, including screening questions prior to the visit, additional usage of staff PPE, and extensive cleaning of exam room while observing appropriate contact time as indicated for disinfecting solutions.   Has had diastolic of 99 at times Systolic as high as 527 Also concerned about "severe headache" Thinks it is from HTN Has been using losartan 100mg  daily and even went up to 125mg  daily  Having foot pain--left Uses ibuprofen for that Mostly just the heel (since March)--swelling over Achilles Was wearing sneakers before this started--now mostly sandals  Hot water helps some Trouble walking fast Ice has seemed to help  Current Outpatient Medications on File Prior to Visit  Medication Sig Dispense Refill  . ALPRAZolam (XANAX) 0.5 MG tablet TAKE 1/2-1 TABLET BY MOUTH TWO TIMES A DAY AS NEEDED FOR ANXIETY 60 tablet 0  . Calcium Carb-Cholecalciferol (CALCIUM 600 + D) 600-200 MG-UNIT TABS Take 3 tablets by mouth daily.    . chlorhexidine (PERIDEX) 0.12 % solution SWISH AND SPIT WITH ONE CAPFUL TWICE A DAY AS DIRECTED. 473 mL 5  . estradiol (ESTRACE) 1 MG tablet TAKE 1 TABLET BY MOUTH EVERY DAY 90 tablet 3  . losartan (COZAAR) 25 MG tablet Take 3 tablets (75 mg total) by mouth daily. 270 tablet 3  . MAGNESIUM PO Take by mouth.    . psyllium (METAMUCIL) 58.6 % powder Take 1 packet by mouth daily.     No current facility-administered medications on file prior to visit.    No Known Allergies  Past Medical History:  Diagnosis Date  . Allergic rhinitis due to pollen   . Anxiety   . Depression   . GERD (gastroesophageal reflux disease)     Past Surgical History:  Procedure Laterality Date  . ABDOMINAL HYSTERECTOMY  08/1998   BSO fibroids  .  CHOLECYSTECTOMY  8/12  . COLON SURGERY    . HERNIA REPAIR  11/2003   RIH (ingram)    Family History  Problem Relation Age of Onset  . Stroke Father   . Heart disease Brother 40       recent MI  . Heart disease Mother   . Colon polyps Neg Hx   . Stomach cancer Neg Hx     Social History   Socioeconomic History  . Marital status: Married    Spouse name: Not on file  . Number of children: Not on file  . Years of education: Not on file  . Highest education level: Not on file  Occupational History  . 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 Smoking status: Never Smoker  . Smokeless tobacco: Never Used  Substance and Sexual Activity  . Alcohol use: No  . Drug use: No  . Sexual activity: Not on file  Other Topics Concern  . Not on file  Social History Narrative  . Not on file   Social Determinants of Health   Financial Resource Strain:   . Difficulty of Paying Living Expenses:   Food Insecurity:   . Worried About Charity fundraiser in the Last Year:   . Arboriculturist in the Last Year:   Transportation Needs:   . Film/video editor (Medical):   Marland Kitchen  Lack of Transportation (Non-Medical):   Physical Activity:   . Days of Exercise per Week:   . Minutes of Exercise per Session:   Stress:   . Feeling of Stress :   Social Connections:   . Frequency of Communication with Friends and Family:   . Frequency of Social Gatherings with Friends and Family:   . Attends Religious Services:   . Active Member of Clubs or Organizations:   . Attends Archivist Meetings:   Marland Kitchen Marital Status:   Intimate Partner Violence:   . Fear of Current or Ex-Partner:   . Emotionally Abused:   Marland Kitchen Physically Abused:   . Sexually Abused:    Review of Systems  No known foot injury No chest pain or SOB     Objective:   Physical Exam  Constitutional: No distress.  Cardiovascular: Normal rate and regular rhythm. Exam reveals no gallop.  No  murmur heard. Respiratory: Effort normal and breath sounds normal. She has no wheezes. She has no rales.  GI: Normal appearance.  Musculoskeletal:     Comments: Tender nodule over left Achilles No heel tenderness  Neurological: She is alert.           Assessment & Plan:

## 2020-06-02 NOTE — Assessment & Plan Note (Signed)
BP Readings from Last 3 Encounters:  06/02/20 (!) 136/92  09/03/19 124/84  01/22/19 140/84   Sub-optimal control Will add HCTZ

## 2020-06-04 ENCOUNTER — Ambulatory Visit (INDEPENDENT_AMBULATORY_CARE_PROVIDER_SITE_OTHER): Payer: BLUE CROSS/BLUE SHIELD | Admitting: Family Medicine

## 2020-06-04 ENCOUNTER — Encounter: Payer: Self-pay | Admitting: Family Medicine

## 2020-06-04 ENCOUNTER — Other Ambulatory Visit: Payer: Self-pay

## 2020-06-04 VITALS — BP 130/90 | HR 71 | Temp 98.3°F | Ht 65.5 in | Wt 191.5 lb

## 2020-06-04 DIAGNOSIS — M7662 Achilles tendinitis, left leg: Secondary | ICD-10-CM | POA: Diagnosis not present

## 2020-06-04 MED ORDER — HYDROCHLOROTHIAZIDE 25 MG PO TABS
25.0000 mg | ORAL_TABLET | Freq: Every day | ORAL | 1 refills | Status: DC
Start: 2020-06-04 — End: 2020-09-02

## 2020-06-04 MED ORDER — NITROGLYCERIN 0.2 MG/HR TD PT24
MEDICATED_PATCH | TRANSDERMAL | 2 refills | Status: DC
Start: 2020-06-04 — End: 2020-12-30

## 2020-06-04 NOTE — Progress Notes (Signed)
Sherry Mandala T. Cheril Slattery, MD, [facility] [facility] [place], [address]  Phone: [phone]  FAX: [place] - 64 y.o. female  MRN [id number]  Date of Birth: 1956-02-13  Date: 06/04/2020  PCP: Venia Carbon, MD  Referral: Venia Carbon, MD  Chief Complaint  Patient presents with  . Foot Pain    Left Heel    This visit occurred during the SARS-CoV-2 public health emergency.  Safety protocols were in place, including screening questions prior to the visit, additional usage of staff PPE, and extensive cleaning of exam room while observing appropriate contact time as indicated for disinfecting solutions.   Subjective:   Pleasant patient who presents with a 3 history of posterior heel pain.  No occult, abrupt onset. Has been more insidious in character. There is a dull ache present and worse with activity:  Prior home rehab: Minimal to none Prior meds: NSAIDs Orthosis / Braces: none  02/24/2020:  Showed up out of the blue and she went to bed one night and then it seemed to worsen and progressively get larger at its insertion.  Cannot sit still very well.  Was doing a lot of cleaning in her house.     Review of Systems is noted in the HPI, as appropriate  Objective:   Blood pressure 130/90, pulse 71, temperature 98.3 F (36.8 C), temperature source Temporal, height 5' 5.5" (1.664 m), weight 191 lb 8 oz (86.9 kg), SpO2 98 %.  GEN: No acute distress; alert,appropriate. PULM: Breathing comfortably in no respiratory distress PSYCH: Normally interactive.   Foot: Left Echymosis: no Edema: no ROM: full LE B Gait: heel toe, non-antalgic MT pain: no Callus pattern: none Lateral Mall: NT Medial Mall: NT Talus: NT Navicular: NT Cuboid: NT Calcaneous: NT Metatarsals: NT 5th MT: NT Phalanges: NT Achilles: PAINFUL TO PALPATE AT INSERTION ON LEFT, moderate  NODULE Plantar Fascia: NT Fat Pad: NT Peroneals: NT Post Tib: NT Great Toe: Nml motion Ant Drawer: neg ATFL: NT CFL: NT Deltoid: NT Sensation: intact  Radiology: No results found.  Assessment and Plan:     ICD-10-CM   1. Achilles tendinitis of left lower extremity  M76.62     Level of Medical Decision-Making in this case is MODERATE.  Pathophysiology of achilles tendinopathy reviewed.  Additionally, I have given the patient the program emphasizing eccentric overloading detailed in the instructions based on Dr. Trudi Ida work and protocols.  Supportive footwear reviewed.   I appreciate the opportunity to evaluate this very friendly patient. If you have any question regarding her care or prognosis, do not hesitate to ask.  Patient Instructions  With a shoe, get a rubberized heel cup like Tuli's heel cups.  Achilles Rehab  Calf raises seated. First lower and then raise on 1 foot - if you are able, do on an elevated step or book If this is painful lower on 1 foot but do the heel raise on both feet  Begin with 3 sets of 10 repetitions Increase by 5 repetitions every 3 days Goal is 3 sets of 30 repetitions  When you are able, go to standing straight up on a step, Work up from 10 to 30 like above  Do with both knee straight and knee at 20 degrees of flexion  If pain persists at 3 sets of 30 - add backpack with 5 lbs Increase by 5 lbs per week  to max of 30 lbs a    Follow-up: 2 mo  Meds ordered this encounter  Medications  . hydrochlorothiazide (HYDRODIURIL) 25 MG tablet    Sig: Take 1 tablet (25 mg total) by mouth daily.    Dispense:  90 tablet    Refill:  1  . nitroGLYCERIN (NITRODUR - DOSED IN MG/24 HR) 0.2 mg/hr patch    Sig: Apply 1/4 patch to affected area as directed by MD and change every 24 hours.    Dispense:  30 patch    Refill:  2   There are no discontinued medications. No orders of the defined types were placed in this  encounter.   Signed,  Maud Deed. Birl Lobello, MD   Outpatient Encounter Medications as of 06/04/2020  Medication Sig  . ALPRAZolam (XANAX) 0.5 MG tablet TAKE 1/2-1 TABLET BY MOUTH TWO TIMES A DAY AS NEEDED FOR ANXIETY  . Calcium Carb-Cholecalciferol (CALCIUM 600 + D) 600-200 MG-UNIT TABS Take 3 tablets by mouth daily.  . chlorhexidine (PERIDEX) 0.12 % solution SWISH AND SPIT WITH ONE CAPFUL TWICE A DAY AS DIRECTED.  Marland Kitchen estradiol (ESTRACE) 1 MG tablet TAKE 1 TABLET BY MOUTH EVERY DAY  . losartan-hydrochlorothiazide (HYZAAR) 100-25 MG tablet Take 1 tablet by mouth daily.  Marland Kitchen MAGNESIUM PO Take by mouth.  . psyllium (METAMUCIL) 58.6 % powder Take 1 packet by mouth daily.  . hydrochlorothiazide (HYDRODIURIL) 25 MG tablet Take 1 tablet (25 mg total) by mouth daily.  . nitroGLYCERIN (NITRODUR - DOSED IN MG/24 HR) 0.2 mg/hr patch Apply 1/4 patch to affected area as directed by MD and change every 24 hours.   No facility-administered encounter medications on file as of 06/04/2020.

## 2020-06-04 NOTE — Patient Instructions (Addendum)
With a shoe, get a rubberized heel cup like Tuli's heel cups.  Achilles Rehab  Calf raises seated. First lower and then raise on 1 foot - if you are able, do on an elevated step or book If this is painful lower on 1 foot but do the heel raise on both feet  Begin with 3 sets of 10 repetitions Increase by 5 repetitions every 3 days Goal is 3 sets of 30 repetitions  When you are able, go to standing straight up on a step, Work up from 10 to 30 like above  Do with both knee straight and knee at 20 degrees of flexion  If pain persists at 3 sets of 30 - add backpack with 5 lbs Increase by 5 lbs per week to max of 30 lbs a

## 2020-06-15 ENCOUNTER — Other Ambulatory Visit: Payer: Self-pay | Admitting: Internal Medicine

## 2020-06-16 NOTE — Telephone Encounter (Signed)
Last filled 05-09-20 #60 Last OV 09-03-19 Next OV 09-02-20 [facility]

## 2020-06-23 ENCOUNTER — Other Ambulatory Visit: Payer: Self-pay

## 2020-06-23 ENCOUNTER — Ambulatory Visit
Admission: RE | Admit: 2020-06-23 | Discharge: 2020-06-23 | Disposition: A | Discharge status: Home / Self Care | Payer: BLUE CROSS/BLUE SHIELD | Source: Ambulatory Visit | Attending: Internal Medicine | Admitting: Internal Medicine

## 2020-06-23 DIAGNOSIS — Z1231 Encounter for screening mammogram for malignant neoplasm of breast: Secondary | ICD-10-CM

## 2020-07-07 ENCOUNTER — Telehealth: Payer: Self-pay

## 2020-07-07 NOTE — Telephone Encounter (Signed)
Mailed letter to pt

## 2020-07-07 NOTE — Telephone Encounter (Signed)
-----   Message from Venia Carbon, MD sent at 06/28/2020 11:26 AM EDT ----- Please remind her that this is very overdue ----- Message ----- From: SYSTEM Sent: 06/28/2020  12:15 AM EDT To: Venia Carbon, MD

## 2020-07-14 ENCOUNTER — Other Ambulatory Visit: Payer: Self-pay | Admitting: Internal Medicine

## 2020-07-14 NOTE — Telephone Encounter (Signed)
Last filled 06-16-20 #60 Last OV 06-04-20 Next OV 09-02-20 [facility]

## 2020-08-17 ENCOUNTER — Other Ambulatory Visit: Payer: Self-pay | Admitting: Internal Medicine

## 2020-08-17 NOTE — Telephone Encounter (Signed)
Last office visit 06/04/2020 with Dr. Lorelei Pont for achilles tendinitis.  Last refilled 07/15/2020 for #60 with no refills.  Next Appt: 09/02/2020 for follow up.

## 2020-08-18 NOTE — Telephone Encounter (Signed)
Patient left a voicemail requesting that this refill be done today.

## 2020-08-25 ENCOUNTER — Telehealth: Payer: Self-pay

## 2020-08-25 MED ORDER — PERMETHRIN 5 % EX CREA: 1.0000 "application " | TOPICAL_CREAM | Freq: Once | CUTANEOUS | 0 refills | Status: DC

## 2020-08-25 NOTE — Telephone Encounter (Signed)
See note on this phone note from [place].

## 2020-08-25 NOTE — Telephone Encounter (Signed)
Pt called in stating that she has scabies. She has had them in the past. She is very confident that is what she has. Behind her knees, on her toes, on her ankles, on her elbows, on her shoulders. She has tried alcohol. She is asking if something can be called in since to to [facility]. Please advise to [phone].

## 2020-08-25 NOTE — Telephone Encounter (Signed)
Spoke to pt

## 2020-08-25 NOTE — Telephone Encounter (Signed)
Please let her know that I sent in a cream for scabies. She should apply it to her entire body from the neck down----and leave it on overnight. Then shower it off in the morning. She needs to then wash sheets, towels and any clothes she has worn (preferably hot water). She will need to be seen if it persists

## 2020-08-25 NOTE — Telephone Encounter (Signed)
[facility] Night - Client TELEPHONE ADVICE RECORD [facility] Patient Name: [name] Gender: Female DOB: 1956-11-07 Age: 64 Y 59 M 26 D Return Phone Number: [phone] (Primary) Address: City/State/Zip: [place] [address] Client [facility] Night - Client Client Site [place] Physician Viviana Simpler- MD Contact Type Call Who Is Calling Patient / Member / Family / Caregiver Call Type Triage / Clinical Relationship To Patient Self Return Phone Number [phone] (Primary) Chief Complaint Rash - Widespread Reason for Call Symptomatic / Request for [place] has a rash that is spreading over her body. Caller thinks it is scabies. Additional Comment Caller would like tested for it but the urgent cares near her are full. Translation No Nurse Assessment Nurse: Louie Boston, RN, Barnetta Chapel Date/Time (Eastern Time): 08/23/2020 7:32:38 PM Confirm and document reason for call. If symptomatic, describe symptoms. ---caller states she thinks she has scabies. no known contacts. 1 yr ago she had scabies and she knows what they are. started on one breast then that disappeared and came out on other breast. on toes, behind knees, elbows. feels pin pricks then has to itch it. after itching it leaves a red scab. she used a rash app and she self dx. a lot of itching Has the patient had close contact with a person known or suspected to have the novel coronavirus illness OR traveled / lives in area with major community spread (including international travel) in the last 14 days from the onset of symptoms? * If Asymptomatic, screen for exposure and travel within the last 14 days. ---No Does the patient have any new or worsening symptoms? ---Yes Will a triage be completed? ---Yes Related visit to physician within the last 2 weeks? ---No Does the PT have any chronic conditions? (i.e.  diabetes, asthma, this includes High risk factors for pregnancy, etc.) ---Yes List chronic conditions. ---htn Is this a behavioral health or substance abuse call? ---No Guidelines Guideline Title Affirmed Question Affirmed Notes Nurse Date/Time (Eastern Time) Scabies Exposure [1] Rash looks infected (spreading redness, pus) Urcheck, RN, Barnetta Chapel 08/23/2020 7:36:13 PM PLEASE NOTE: All timestamps contained within this report are represented as [place] Standard Time. CONFIDENTIALTY NOTICE: This fax transmission is intended only for the addressee. It contains information that is legally privileged, confidential or otherwise protected from use or disclosure. If you are not the intended recipient, you are strictly prohibited from reviewing, disclosing, copying using or disseminating any of this information or taking any action in reliance on or regarding this information. If you have received this fax in error, please notify us immediately by telephone so that we can arrange for its return to [place]. Phone: [phone], Toll-Free: [phone], Fax: [phone] Page: 2 of 2 Call Id: [id number] Guidelines Guideline Title Affirmed Question Affirmed Notes Nurse Date/Time Eilene Ghazi Time) AND [2] large red area (>2 in. or 5 cm) Disp. Time Eilene Ghazi Time) Disposition Final User 08/23/2020 7:44:30 PM See HCP within 4 Hours (or PCP triage) Yes Louie Boston, RN, Ledell Noss Disagree/Comply Comply Caller Understands Yes PreDisposition InappropriateToAsk Care Advice Given Per Guideline * IF OFFICE WILL BE CLOSED AND NO PCP (PRIMARY CARE PROVIDER) SECOND-LEVEL TRIAGE: You need to be seen within the next 3 or 4 hours. A nearby Urgent Care Center [facility]) is often a good source of care. Another choice is to go to the ED. Go sooner if you become worse. CARE ADVICE given per Scabies Exposure (Adult) guideline. CALL BACK IF: * You become  worse. DON'T SCRATCH: * Try not to scratch. SEE HCP WITHIN 4 HOURS (OR PCP  TRIAGE): Comments User: Debbora Presto, RN Date/Time (Eastern Time): 08/23/2020 7:37:49 PM on her belly there is a red streak 2 inches. rash started 1 week ago User: Debbora Presto, RN Date/Time Eilene Ghazi Time): 08/23/2020 7:40:41 PM urgent cares are not taking anybody due to full capacity. she wants a prescription called in to [facility] on [address] [phone] User: Debbora Presto, RN Date/Time Eilene Ghazi Time): 08/23/2020 7:44:52 PM advised pt if there are no urgent cares open then she can try ER Referrals [place] UNDECIDED

## 2020-09-02 ENCOUNTER — Ambulatory Visit (INDEPENDENT_AMBULATORY_CARE_PROVIDER_SITE_OTHER): Payer: BLUE CROSS/BLUE SHIELD | Admitting: Internal Medicine

## 2020-09-02 ENCOUNTER — Encounter: Payer: Self-pay | Admitting: Internal Medicine

## 2020-09-02 ENCOUNTER — Other Ambulatory Visit: Payer: Self-pay

## 2020-09-02 VITALS — BP 110/76 | HR 71 | Temp 97.4°F | Ht 66.0 in | Wt 188.0 lb

## 2020-09-02 DIAGNOSIS — D126 Benign neoplasm of colon, unspecified: Secondary | ICD-10-CM | POA: Diagnosis not present

## 2020-09-02 DIAGNOSIS — I10 Essential (primary) hypertension: Secondary | ICD-10-CM

## 2020-09-02 LAB — COMPREHENSIVE METABOLIC PANEL
ALT: 13 U/L (ref 0–35)
AST: 15 U/L (ref 0–37)
Albumin: 4.1 g/dL (ref 3.5–5.2)
Alkaline Phosphatase: 77 U/L (ref 39–117)
BUN: 14 mg/dL (ref 6–23)
CO2: 31 mEq/L (ref 19–32)
Calcium: 10.2 mg/dL (ref 8.4–10.5)
Chloride: 101 mEq/L (ref 96–112)
Creatinine, Ser: 0.87 mg/dL (ref 0.40–1.20)
GFR: 65.41 mL/min (ref >60.00)
Glucose, Bld: 94 mg/dL (ref 70–99)
Potassium: 4.2 mEq/L (ref 3.5–5.1)
Sodium: 139 mEq/L (ref 135–145)
Total Bilirubin: 0.5 mg/dL (ref 0.2–1.2)
Total Protein: 6.9 g/dL (ref 6.0–8.3)

## 2020-09-02 LAB — CBC
HCT: 42.6 % (ref 36.0–46.0)
Hemoglobin: 14.7 g/dL (ref 12.0–15.0)
MCHC: 34.6 g/dL (ref 30.0–36.0)
MCV: 97.1 fl (ref 78.0–100.0)
Platelets: 239 10*3/uL (ref 150.0–400.0)
RBC: 4.38 Mil/uL (ref 3.87–5.11)
RDW: 12.3 % (ref 11.5–15.5)
WBC: 6.3 10*3/uL (ref 4.0–10.5)

## 2020-09-02 LAB — T4, FREE: Free T4: 1.13 ng/dL (ref 0.60–1.60)

## 2020-09-02 MED ORDER — LOSARTAN POTASSIUM 100 MG PO TABS
100.0000 mg | ORAL_TABLET | Freq: Every day | ORAL | 3 refills | Status: DC
Start: 2020-09-02 — End: 2021-07-05

## 2020-09-02 MED ORDER — HYDROCHLOROTHIAZIDE 25 MG PO TABS
25.0000 mg | ORAL_TABLET | Freq: Every day | ORAL | 3 refills | Status: DC
Start: 2020-09-02 — End: 2020-11-27

## 2020-09-02 NOTE — Assessment & Plan Note (Signed)
BP Readings from Last 3 Encounters:  09/02/20 110/76  06/04/20 130/90  06/02/20 (!) 136/92   Good control on losartan 100 and HCTZ 25 Due for labs

## 2020-09-02 NOTE — Progress Notes (Signed)
Subjective:    Patient ID: [name], female    DOB: 01/30/1956, 64 y.o.   MRN: [id number]  HPI Here for follow up of HTN This visit occurred during the SARS-CoV-2 public health emergency.  Safety protocols were in place, including screening questions prior to the visit, additional usage of staff PPE, and extensive cleaning of exam room while observing appropriate contact time as indicated for disinfecting solutions.   Concerned about scabies May have been exposed cleaning someone's house Developed itchy rash on arms/chest/ between fingers and toes Mostly better now  Checks BP at times Tends to be on low side---no orthostatic dizziness No chest pain or SOB No problems with medications  Current Outpatient Medications on File Prior to Visit  Medication Sig Dispense Refill  . ALPRAZolam (XANAX) 0.5 MG tablet TAKE 1/2 TO 1 TABLET BY MOUTH TWO TIMES A DAY AS NEEDED FOR ANXIETY 60 tablet 0  . Calcium Carb-Cholecalciferol (CALCIUM 600 + D) 600-200 MG-UNIT TABS Take 3 tablets by mouth daily.    . chlorhexidine (PERIDEX) 0.12 % solution SWISH AND SPIT WITH ONE CAPFUL TWICE A DAY AS DIRECTED. 473 mL 5  . estradiol (ESTRACE) 1 MG tablet TAKE 1 TABLET BY MOUTH EVERY DAY 90 tablet 3  . hydrochlorothiazide (HYDRODIURIL) 25 MG tablet Take 1 tablet (25 mg total) by mouth daily. 90 tablet 1  . losartan (COZAAR) 100 MG tablet Take 100 mg by mouth daily.    Marland Kitchen MAGNESIUM PO Take by mouth.    . psyllium (METAMUCIL) 58.6 % powder Take 1 packet by mouth daily.    . nitroGLYCERIN (NITRODUR - DOSED IN MG/24 HR) 0.2 mg/hr patch Apply 1/4 patch to affected area as directed by MD and change every 24 hours. (Patient not taking: Reported on 09/02/2020) 30 patch 2   No current facility-administered medications on file prior to visit.    No Known Allergies  Past Medical History:  Diagnosis Date  . Allergic rhinitis due to pollen   . Anxiety   . Depression   . GERD (gastroesophageal reflux disease)      Past Surgical History:  Procedure Laterality Date  . ABDOMINAL HYSTERECTOMY  08/1998   BSO fibroids  . CHOLECYSTECTOMY  8/12  . COLON SURGERY    . HERNIA REPAIR  11/2003   RIH (ingram)    Family History  Problem Relation Age of Onset  . Stroke Father   . Heart disease Brother 37       recent MI  . Heart disease Mother   . Colon polyps Neg Hx   . Stomach cancer Neg Hx     Social History   Socioeconomic History  . Marital status: Married    Spouse name: Not on file  . Number of children: Not on file  . Years of education: Not on file  . Highest education level: Not on file  Occupational History  . 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 Smoking status: Never Smoker  . Smokeless tobacco: Never Used  Substance and Sexual Activity  . Alcohol use: No  . Drug use: No  . Sexual activity: Not on file  Other Topics Concern  . Not on file  Social History Narrative  . Not on file   Social Determinants of Health   Financial Resource Strain:   . Difficulty of Paying Living Expenses: Not on file  Food Insecurity:   . Worried About Charity fundraiser in the Last Year:  Not on file  . Ran Out of Food in the Last Year: Not on file  Transportation Needs:   . Lack of Transportation (Medical): Not on file  . Lack of Transportation (Non-Medical): Not on file  Physical Activity:   . Days of Exercise per Week: Not on file  . Minutes of Exercise per Session: Not on file  Stress:   . Feeling of Stress : Not on file  Social Connections:   . Frequency of Communication with Friends and Family: Not on file  . Frequency of Social Gatherings with Friends and Family: Not on file  . Attends Religious Services: Not on file  . Active Member of Clubs or Organizations: Not on file  . Attends Archivist Meetings: Not on file  . Marital Status: Not on file  Intimate Partner Violence:   . Fear of Current or Ex-Partner: Not on file  .  Emotionally Abused: Not on file  . Physically Abused: Not on file  . Sexually Abused: Not on file   Review of Systems  Has cut the estradiol to 3 days per week--discussed further wean Appetite is fine Weight fairly stable    Objective:   Physical Exam Constitutional:      Appearance: Normal appearance.  Cardiovascular:     Rate and Rhythm: Normal rate and regular rhythm.     Pulses: Normal pulses.     Heart sounds: No murmur heard.  No gallop.   Pulmonary:     Effort: Pulmonary effort is normal.     Breath sounds: Normal breath sounds. No wheezing or rales.  Musculoskeletal:     Cervical back: Neck supple.     Right lower leg: No edema.     Left lower leg: No edema.  Lymphadenopathy:     Cervical: No cervical adenopathy.  Skin:    Comments: Scattered pinpoint red papules---only 1 small inflamed one in left lateral groin  Neurological:     Mental Status: She is alert.            Assessment & Plan:

## 2020-09-02 NOTE — Assessment & Plan Note (Signed)
Tubular adenoma in 2012 Now ready for another colonoscopy Prefers [place] (and Dr Deatra Ina not here now)

## 2020-09-07 ENCOUNTER — Other Ambulatory Visit: Payer: Self-pay | Admitting: Internal Medicine

## 2020-09-07 ENCOUNTER — Telehealth: Payer: Self-pay | Admitting: *Deleted

## 2020-09-07 NOTE — Telephone Encounter (Signed)
Electronic refill request. Permethrin Last office visit:   09/02/2020 Last Filled:

## 2020-09-07 NOTE — Telephone Encounter (Signed)
Patient called stating that she was given a cream a couple of weeks ago for scabies. Patient stated that she used the cream but she still has scabies. Patient stated that she needs another script sent in and she does not remember the name of the medication. [facility].

## 2020-09-08 ENCOUNTER — Other Ambulatory Visit: Payer: Self-pay | Admitting: Internal Medicine

## 2020-09-08 MED ORDER — PERMETHRIN 5 % EX CREA: 1.0000 "application " | TOPICAL_CREAM | Freq: Once | CUTANEOUS | 0 refills | Status: AC

## 2020-09-08 MED ORDER — PERMETHRIN 5 % EX CREA: 1.0000 "application " | TOPICAL_CREAM | Freq: Once | CUTANEOUS | 0 refills | Status: DC

## 2020-09-08 NOTE — Telephone Encounter (Signed)
Please let her know that I sent the refill--but if the rash persists, she will need to see a dermatologist

## 2020-09-08 NOTE — Telephone Encounter (Signed)
Pt called back stating she needs [facility]  Please call pt when this has been done Best number [phone]

## 2020-09-08 NOTE — Telephone Encounter (Signed)
Spoke to pt. It was sent to [facility] by Dr Silvio Pate. [facility] She will go to Dermatology if no improvement.

## 2020-11-12 ENCOUNTER — Other Ambulatory Visit: Payer: Self-pay | Admitting: Internal Medicine

## 2020-11-27 ENCOUNTER — Other Ambulatory Visit: Payer: Self-pay | Admitting: Internal Medicine

## 2020-12-30 ENCOUNTER — Other Ambulatory Visit: Payer: Self-pay

## 2020-12-30 ENCOUNTER — Encounter: Payer: Self-pay | Admitting: Internal Medicine

## 2020-12-30 ENCOUNTER — Ambulatory Visit (INDEPENDENT_AMBULATORY_CARE_PROVIDER_SITE_OTHER): Payer: Medicare Other | Admitting: Internal Medicine

## 2020-12-30 VITALS — BP 130/84 | HR 67 | Temp 97.5°F | Ht 66.0 in | Wt 194.0 lb

## 2020-12-30 DIAGNOSIS — Z23 Encounter for immunization: Secondary | ICD-10-CM

## 2020-12-30 DIAGNOSIS — L570 Actinic keratosis: Secondary | ICD-10-CM | POA: Diagnosis not present

## 2020-12-30 DIAGNOSIS — D126 Benign neoplasm of colon, unspecified: Secondary | ICD-10-CM

## 2020-12-30 NOTE — Assessment & Plan Note (Signed)
Decided she didn't want colonoscopy due to COVID concerns She will do FIT though--- I told her this is not that great but hopefully when things settle down, she will agree again to colonoscopy (but will do if the test is positive)

## 2020-12-30 NOTE — Progress Notes (Signed)
Subjective:    Patient ID: [name], female    DOB: February 07, 1956, 65 y.o.   MRN: [id number]  HPI Here due to skin lesion on her face This visit occurred during the SARS-CoV-2 public health emergency.  Safety protocols were in place, including screening questions prior to the visit, additional usage of staff PPE, and extensive cleaning of exam room while observing appropriate contact time as indicated for disinfecting solutions.   Has a small dry spot Noted it about 3 months ago No change in size  Also has growth on left neck---going to dermatologist for this  Current Outpatient Medications on File Prior to Visit  Medication Sig Dispense Refill  . Calcium Carb-Cholecalciferol 600-200 MG-UNIT TABS Take 3 tablets by mouth daily.    . chlorhexidine (PERIDEX) 0.12 % solution SWISH AND SPIT WITH ONE CAPFUL TWICE A DAY AS DIRECTED. 473 mL 5  . estradiol (ESTRACE) 1 MG tablet TAKE 1 TABLET BY MOUTH EVERY DAY 90 tablet 3  . hydrochlorothiazide (HYDRODIURIL) 25 MG tablet TAKE ONE TABLET BY MOUTH DAILY 90 tablet 3  . losartan (COZAAR) 100 MG tablet Take 1 tablet (100 mg total) by mouth daily. 90 tablet 3  . MAGNESIUM PO Take by mouth.    . Melatonin 10 MG CAPS Take by mouth.    . polyethylene glycol (MIRALAX / GLYCOLAX) 17 g packet Take 17 g by mouth daily.    . psyllium (METAMUCIL) 58.6 % powder Take 1 packet by mouth daily.     No current facility-administered medications on file prior to visit.    No Known Allergies  Past Medical History:  Diagnosis Date  . Allergic rhinitis due to pollen   . Anxiety   . Depression   . GERD (gastroesophageal reflux disease)     Past Surgical History:  Procedure Laterality Date  . ABDOMINAL HYSTERECTOMY  08/1998   BSO fibroids  . CHOLECYSTECTOMY  8/12  . COLON SURGERY    . HERNIA REPAIR  11/2003   RIH (ingram)    Family History  Problem Relation Age of Onset  . Stroke Father   . Heart disease Brother 21       recent MI  . Heart  disease Mother   . Colon polyps Neg Hx   . Stomach cancer Neg Hx     Social History   Socioeconomic History  . Marital status: Married    Spouse name: Not on file  . Number of children: Not on file  . Years of education: Not on file  . Highest education level: Not on file  Occupational History  . 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 Smoking status: Never Smoker  . Smokeless tobacco: Never Used  Substance and Sexual Activity  . Alcohol use: No  . Drug use: No  . Sexual activity: Not on file  Other Topics Concern  . Not on file  Social History Narrative  . Not on file   Social Determinants of Health   Financial Resource Strain: Not on file  Food Insecurity: Not on file  Transportation Needs: Not on file  Physical Activity: Not on file  Stress: Not on file  Social Connections: Not on file  Intimate Partner Violence: Not on file   Review of Systems No scalp problems Hasn't been sick    Objective:   Physical Exam Skin:    Comments: Cyst on left neck Scaly area ~ 4-5 mm on upper right cheek ?slight inflammation  Assessment & Plan:

## 2020-12-30 NOTE — Addendum Note (Signed)
Addended by: Pilar Grammes on: 12/30/2020 12:35 PM   Modules accepted: Orders

## 2020-12-30 NOTE — Assessment & Plan Note (Signed)
Unclear if actinic or other lesion Discussed alternatives for Rx and she requests cryotherapy Discussed potential side effects---hypopigmentation especially  Liquid nitrogen 30 seconds x 2 Tolerated well Discussed care

## 2021-01-07 ENCOUNTER — Other Ambulatory Visit (INDEPENDENT_AMBULATORY_CARE_PROVIDER_SITE_OTHER): Payer: Medicare Other

## 2021-01-07 DIAGNOSIS — D126 Benign neoplasm of colon, unspecified: Secondary | ICD-10-CM

## 2021-01-07 LAB — FECAL OCCULT BLOOD, IMMUNOCHEMICAL: Fecal Occult Bld: NEGATIVE

## 2021-02-03 ENCOUNTER — Ambulatory Visit (INDEPENDENT_AMBULATORY_CARE_PROVIDER_SITE_OTHER): Payer: Medicare Other | Admitting: Family Medicine

## 2021-02-03 ENCOUNTER — Encounter: Payer: Self-pay | Admitting: Family Medicine

## 2021-02-03 ENCOUNTER — Other Ambulatory Visit: Payer: Self-pay

## 2021-02-03 ENCOUNTER — Ambulatory Visit (INDEPENDENT_AMBULATORY_CARE_PROVIDER_SITE_OTHER)
Admission: RE | Admit: 2021-02-03 | Discharge: 2021-02-03 | Disposition: A | Discharge status: Home / Self Care | Payer: Medicare Other | Source: Ambulatory Visit | Attending: Family Medicine | Admitting: Family Medicine

## 2021-02-03 VITALS — BP 136/82 | HR 75 | Temp 98.1°F | Ht 65.5 in | Wt 194.0 lb

## 2021-02-03 DIAGNOSIS — M7662 Achilles tendinitis, left leg: Secondary | ICD-10-CM

## 2021-02-03 DIAGNOSIS — G8929 Other chronic pain: Secondary | ICD-10-CM | POA: Diagnosis not present

## 2021-02-03 DIAGNOSIS — M25572 Pain in left ankle and joints of left foot: Secondary | ICD-10-CM

## 2021-02-03 MED ORDER — PREDNISONE 20 MG PO TABS: ORAL_TABLET | ORAL | 0 refills | Status: DC

## 2021-02-03 NOTE — Progress Notes (Signed)
Trevis Eden T. Jagjit Riner, MD, [place] at [facility] [place], [address]  Phone: [phone]  FAX: [place] - 65 y.o. female  MRN [id number]  Date of Birth: [birth year]  Date: 02/03/2021  PCP: Venia Carbon, MD  Referral: Venia Carbon, MD  Chief Complaint  Patient presents with  . Achilles Tendinitis    Left-not any better    This visit occurred during the SARS-CoV-2 public health emergency.  Safety protocols were in place, including screening questions prior to the visit, additional usage of staff PPE, and extensive cleaning of exam room while observing appropriate contact time as indicated for disinfecting solutions.   Subjective:   [name] is a 65 y.o. very pleasant female patient with Body mass index is 31.79 kg/m. who presents with the following:  9 month follow-up and not doing better, L achilles: Has a significant palpable nodule at the Achilles insertion that is tender and she notices that this is tender when she is wearing shoes to put pressure on it.  Additionally she has general aches and pains in her feet in general.  These are relatively nonspecific, but she has pain in the midfoot and sometimes in the ankle itself.  This is without any trauma, but her feet have had some pain intermittently for some period of time months to years.  Feet will ache quite a bit.  Achy a lot of the time.  Has been the exact same.   Review of Systems is noted in the HPI, as appropriate   Objective:   BP 136/82   Pulse 75   Temp 98.1 F (36.7 C) (Temporal)   Ht 5' 5.5" (1.664 m)   Wt 194 lb (88 kg)   SpO2 96%   BMI 31.79 kg/m    ANKLE: B Echymosis: no Edema: no ROM: Full dorsi and plantar flexion, inversion, eversion Gait: heel toe, mildly antalgic Lateral Mall: NT Medial Mall: NT Talus: NT Navicular: NT Cuboid: NT Calcaneous: NT Metatarsals:  NT 5th MT: NT Phalanges: NT Achilles: TTP at insertion on the L Plantar Fascia: NT Fat Pad: NT Peroneals: NT Post Tib: NT Great Toe: Nml motion Ant Drawer: neg Talar Tilt: neg ATFL: NT CFL: NT Deltoid: NT Str: 5/5 Other Special tests: none Sensation: intact   Radiology: No results found.   Assessment and Plan:     ICD-10-CM   1. Achilles tendinitis of left lower extremity  M76.62 DG Foot Complete Left  2. Chronic pain of left ankle  M25.572 DG Foot Complete Left   G89.29    Recalcitrant Achilles tendinopathy.  Reviewed eccentric training program again, and I am also going to pulse her with 14 days of steroids.  If she is still symptomatic in 2 to 3 weeks, then formal physical therapy with Decadron iontophoresis would be a next step.  I cannot find anything focal about her foot and ankle except for her Achilles.  Exacerbation of some chronic foot pain, this will be challenging, and good foot wear and general physical fitness will likely help some.  Meds ordered this encounter  Medications  . predniSONE (DELTASONE) 20 MG tablet    Sig: 2 tabs po for 7 days, then 1 tab po for 7 days    Dispense:  21 tablet    Refill:  0   Medications Discontinued During This Encounter  Medication Reason  . Melatonin 10 MG  CAPS Change in therapy   Orders Placed This Encounter  Procedures  . DG Foot Complete Left    Follow-up: No follow-ups on file.  Signed,  Maud Deed. Michon Kaczmarek, MD   Outpatient Encounter Medications as of 02/03/2021  Medication Sig  . Calcium Carb-Cholecalciferol 600-200 MG-UNIT TABS Take 3 tablets by mouth daily.  . chlorhexidine (PERIDEX) 0.12 % solution SWISH AND SPIT WITH ONE CAPFUL TWICE A DAY AS DIRECTED.  Marland Kitchen estradiol (ESTRACE) 1 MG tablet TAKE 1 TABLET BY MOUTH EVERY DAY  . hydrochlorothiazide (HYDRODIURIL) 25 MG tablet TAKE ONE TABLET BY MOUTH DAILY  . latanoprost (XALATAN) 0.005 % ophthalmic solution SMARTSIG:In Eye(s)  . losartan (COZAAR) 100 MG  tablet Take 1 tablet (100 mg total) by mouth daily.  Marland Kitchen MAGNESIUM PO Take by mouth.  . melatonin 5 MG TABS Take 5 mg by mouth.  . polyethylene glycol (MIRALAX / GLYCOLAX) 17 g packet Take 17 g by mouth daily.  . predniSONE (DELTASONE) 20 MG tablet 2 tabs po for 7 days, then 1 tab po for 7 days  . psyllium (METAMUCIL) 58.6 % powder Take 1 packet by mouth daily.  . [DISCONTINUED] Melatonin 10 MG CAPS Take by mouth.   No facility-administered encounter medications on file as of 02/03/2021.

## 2021-02-09 ENCOUNTER — Telehealth: Payer: Self-pay

## 2021-02-09 MED ORDER — ALPRAZOLAM 0.5 MG PO TABS: ORAL_TABLET | ORAL | 0 refills | Status: DC

## 2021-02-09 NOTE — Telephone Encounter (Signed)
Pt left v/m requesting refill of generic xanax 0.5 mg; not on current med list; on hx med list last refilled on # 60 on 08/18/20. Noted on that refill D/C by [facility] CMA on 12/30/20. Last saw Dr Silvio Pate on 01/29/21. Sending note to [facility] CMA.

## 2021-02-09 NOTE — Telephone Encounter (Signed)
I cannot fill this. It is up to Dr Silvio Pate.

## 2021-02-09 NOTE — Telephone Encounter (Signed)
Rx refilled.

## 2021-03-04 ENCOUNTER — Telehealth: Payer: Self-pay

## 2021-03-04 ENCOUNTER — Ambulatory Visit: Payer: BLUE CROSS/BLUE SHIELD | Admitting: Dermatology

## 2021-03-04 DIAGNOSIS — E2839 Other primary ovarian failure: Secondary | ICD-10-CM

## 2021-03-04 NOTE — Telephone Encounter (Signed)
Please let her know that I sent the order for the bone density to [facility]--where she gets her mammograms. She should call to set it up. I thought I referred her for the colonoscopy in the fall---but confirm she wanted that in [place] (as opposed to [facility] in [place])

## 2021-03-04 NOTE — Telephone Encounter (Signed)
Pt has left a VM on triage line asking for a referral for a colonoscopy and Bone Density to be scheduled in May.

## 2021-03-04 NOTE — Telephone Encounter (Signed)
Left message for pt to see where she wants colonoscopy

## 2021-03-04 NOTE — Addendum Note (Signed)
Addended by: Viviana Simpler I on: 03/04/2021 12:41 PM   Modules accepted: Orders

## 2021-03-05 NOTE — Telephone Encounter (Signed)
Pt left v/m that she wants colonoscopy at same place she had it last time in [place] with phone #. Pt does not remember name of facility or where was done in [place]. Pt request cb.

## 2021-03-05 NOTE — Telephone Encounter (Signed)
Left message with info that she last went to [facility].

## 2021-03-09 ENCOUNTER — Other Ambulatory Visit: Payer: Self-pay | Admitting: Internal Medicine

## 2021-03-09 NOTE — Telephone Encounter (Signed)
Last filled 02-09-21 #60 Last OV with Dr Silvio Pate 09-02-20 Next OV with Dr Silvio Pate 09-07-21 [facility]

## 2021-03-10 ENCOUNTER — Other Ambulatory Visit: Payer: Self-pay

## 2021-03-10 ENCOUNTER — Ambulatory Visit: Payer: Medicare HMO | Admitting: Family Medicine

## 2021-03-16 DIAGNOSIS — L720 Epidermal cyst: Secondary | ICD-10-CM | POA: Diagnosis not present

## 2021-03-16 DIAGNOSIS — L820 Inflamed seborrheic keratosis: Secondary | ICD-10-CM | POA: Diagnosis not present

## 2021-03-16 DIAGNOSIS — B078 Other viral warts: Secondary | ICD-10-CM | POA: Diagnosis not present

## 2021-03-17 ENCOUNTER — Other Ambulatory Visit: Payer: Self-pay

## 2021-03-17 ENCOUNTER — Encounter: Payer: Self-pay | Admitting: Family Medicine

## 2021-03-17 ENCOUNTER — Ambulatory Visit (INDEPENDENT_AMBULATORY_CARE_PROVIDER_SITE_OTHER): Payer: Medicare HMO | Admitting: Family Medicine

## 2021-03-17 VITALS — BP 130/92 | HR 69 | Temp 97.7°F | Ht 65.5 in | Wt 193.8 lb

## 2021-03-17 DIAGNOSIS — M6528 Calcific tendinitis, other site: Secondary | ICD-10-CM

## 2021-03-17 NOTE — Progress Notes (Signed)
[name], MD, [place] at [facility] [place], [address]  Phone: [phone]  FAX: [place] - 65 y.o. female  MRN [id number]  Date of Birth: [birth year]  Date: 03/17/2021  PCP: Venia Carbon, MD  Referral: Venia Carbon, MD  Chief Complaint  Patient presents with  . Follow-up    Left foot-not any better    This visit occurred during the SARS-CoV-2 public health emergency.  Safety protocols were in place, including screening questions prior to the visit, additional usage of staff PPE, and extensive cleaning of exam room while observing appropriate contact time as indicated for disinfecting solutions.   Subjective:   [name] is a 65 y.o. very pleasant female patient with Body mass index is 31.75 kg/m. who presents with the following:  Recalcitrant achilles tendinopathy of the Left side.  On further questioning, the patient really does not have any significant pain at this point near the insertion.  She does have some pain posteriorly where there is a harder density, and this bothers her to wear some kinds of shoes.  Right now she has basically no pain with the exception of that being uncomfortable with wearing nontender shoes.  Review of Systems is noted in the HPI, as appropriate   Objective:   BP (!) 130/92   Pulse 69   Temp 97.7 F (36.5 C) (Temporal)   Ht 5' 5.5" (1.664 m)   Wt 193 lb 12 oz (87.9 kg)   SpO2 96%   BMI 31.75 kg/m   The entirety of the forefoot and midfoot and hindfoot are entirely nontender and unremarkable.  Nontender at the talus, medial and lateral malleoli as well as all tendon structures with the exception of the Achilles tendon.  It is nontender to palpation, and she has good normal sensation and no pain at the calf.  Does have a palpable density at the Achilles insertion.  Radiology: No results  found.  Assessment and Plan:     ICD-10-CM   1. Calcific Achilles tendonitis  M65.28    There is a large calcific deposit at the insertion of the Achilles tendon consistent with Achilles calcific tendinitis.  We talked about this finding, we reviewed on the x-ray.  This is going to be chronic in nature.  Do not think that anything short of operative intervention would remove this.  At this point she is content to alter her shoewear and would like to avoid any procedure.  Signed,  Maud Deed. Raylei Losurdo, MD   Outpatient Encounter Medications as of 03/17/2021  Medication Sig  . ALPRAZolam (XANAX) 0.5 MG tablet TAKE 1/2 TO 1 TABLET BY MOUTH TWO TIMES A DAY AS NEEDED FOR ANXIETY  . Calcium Carb-Cholecalciferol 600-200 MG-UNIT TABS Take 3 tablets by mouth daily.  . chlorhexidine (PERIDEX) 0.12 % solution SWISH AND SPIT WITH ONE CAPFUL TWICE A DAY AS DIRECTED.  Marland Kitchen estradiol (ESTRACE) 1 MG tablet TAKE 1 TABLET BY MOUTH EVERY DAY  . hydrochlorothiazide (HYDRODIURIL) 25 MG tablet TAKE ONE TABLET BY MOUTH DAILY  . latanoprost (XALATAN) 0.005 % ophthalmic solution SMARTSIG:In Eye(s)  . losartan (COZAAR) 100 MG tablet Take 1 tablet (100 mg total) by mouth daily.  Marland Kitchen MAGNESIUM PO Take by mouth.  . melatonin 5 MG TABS Take 5 mg by mouth.  . polyethylene glycol (MIRALAX / GLYCOLAX) 17 g packet Take 17 g by mouth daily.  Marland Kitchen  psyllium (METAMUCIL) 58.6 % powder Take 1 packet by mouth daily.  . [DISCONTINUED] predniSONE (DELTASONE) 20 MG tablet 2 tabs po for 7 days, then 1 tab po for 7 days   No facility-administered encounter medications on file as of 03/17/2021.

## 2021-04-08 ENCOUNTER — Other Ambulatory Visit: Payer: Self-pay | Admitting: Internal Medicine

## 2021-04-08 NOTE — Telephone Encounter (Signed)
Last filled 03-09-21 #60 Last OV Acute 03-17-21 Next OV 09-07-21 [facility]

## 2021-04-12 DIAGNOSIS — Z0100 Encounter for examination of eyes and vision without abnormal findings: Secondary | ICD-10-CM | POA: Diagnosis not present

## 2021-04-12 DIAGNOSIS — H52223 Regular astigmatism, bilateral: Secondary | ICD-10-CM | POA: Diagnosis not present

## 2021-04-22 ENCOUNTER — Telehealth: Payer: Self-pay | Admitting: Internal Medicine

## 2021-04-22 NOTE — Telephone Encounter (Signed)
Pt called back and said she checked [facility] at [facility] just now and it was 147/87 63. She said she is feeling just fine and will let us know if she has any issues.

## 2021-04-22 NOTE — Telephone Encounter (Signed)
PT called in due to she went to the dentist and they took her BP and it was 188/128 and was told she needed to see her PCP  wanted to know about being seen sooner than 5/9 @2 

## 2021-04-22 NOTE — Telephone Encounter (Signed)
Yes---I get tons of calls about elevated BP at dentist Since it has come down, no action needed

## 2021-04-22 NOTE — Telephone Encounter (Signed)
Spoke to pt. She is going to check her BP when she gets home and let me know what that reading is.

## 2021-05-10 ENCOUNTER — Other Ambulatory Visit: Payer: Self-pay | Admitting: Internal Medicine

## 2021-05-11 NOTE — Telephone Encounter (Signed)
Refill request Alprazolam Last refill 04/08/21 #60 Last office visit 03/17/21 acute Upcoming appointment 09/07/21

## 2021-05-16 ENCOUNTER — Other Ambulatory Visit: Payer: Self-pay | Admitting: Internal Medicine

## 2021-05-18 ENCOUNTER — Other Ambulatory Visit: Payer: Self-pay | Admitting: Internal Medicine

## 2021-06-09 ENCOUNTER — Other Ambulatory Visit: Payer: Self-pay | Admitting: Internal Medicine

## 2021-06-09 NOTE — Telephone Encounter (Signed)
Last refill 05/11/21 #60 Last office visit 03/17/21 acute Next OV 09/07/21 [facility]

## 2021-06-17 DIAGNOSIS — R69 Illness, unspecified: Secondary | ICD-10-CM | POA: Diagnosis not present

## 2021-07-03 ENCOUNTER — Other Ambulatory Visit: Payer: Self-pay | Admitting: Internal Medicine

## 2021-07-05 ENCOUNTER — Telehealth: Payer: Self-pay

## 2021-07-05 NOTE — Telephone Encounter (Signed)
Pt left v/m that pt has always taken losartan 100 mg in morning  and HCTZ 25 mg at night and that has always kept BP controlled. Pt said meds were changed to combination of the 2 meds to 125 mg and did not control BP. Pt request losartan 100 mg taking one tab in morning and 25 mg taking one tab at night. Per med list I do not see combo. Unable to reach pt by phone until after 4 PM per v/m sending note to [facility] CMA.

## 2021-07-05 NOTE — Telephone Encounter (Signed)
Spoke to pt. Advised her the meds are separate and I sent in a refill for losartan this morning.

## 2021-07-05 NOTE — Telephone Encounter (Signed)
The medications are separated and I refilled the losartan earlier today.

## 2021-07-08 ENCOUNTER — Other Ambulatory Visit: Payer: Self-pay

## 2021-07-08 ENCOUNTER — Ambulatory Visit: Payer: Medicare HMO | Admitting: Dermatology

## 2021-07-08 ENCOUNTER — Other Ambulatory Visit: Payer: Self-pay | Admitting: Internal Medicine

## 2021-07-08 DIAGNOSIS — Z808 Family history of malignant neoplasm of other organs or systems: Secondary | ICD-10-CM | POA: Diagnosis not present

## 2021-07-08 DIAGNOSIS — L720 Epidermal cyst: Secondary | ICD-10-CM

## 2021-07-08 DIAGNOSIS — L578 Other skin changes due to chronic exposure to nonionizing radiation: Secondary | ICD-10-CM

## 2021-07-08 DIAGNOSIS — L821 Other seborrheic keratosis: Secondary | ICD-10-CM

## 2021-07-08 DIAGNOSIS — D234 Other benign neoplasm of skin of scalp and neck: Secondary | ICD-10-CM | POA: Diagnosis not present

## 2021-07-08 DIAGNOSIS — L814 Other melanin hyperpigmentation: Secondary | ICD-10-CM | POA: Diagnosis not present

## 2021-07-08 DIAGNOSIS — Z1283 Encounter for screening for malignant neoplasm of skin: Secondary | ICD-10-CM | POA: Diagnosis not present

## 2021-07-08 DIAGNOSIS — D1800 Hemangioma unspecified site: Secondary | ICD-10-CM | POA: Diagnosis not present

## 2021-07-08 DIAGNOSIS — D229 Melanocytic nevi, unspecified: Secondary | ICD-10-CM

## 2021-07-08 DIAGNOSIS — D239 Other benign neoplasm of skin, unspecified: Secondary | ICD-10-CM

## 2021-07-08 NOTE — Progress Notes (Signed)
   New Patient Visit  Subjective  [name] is a 65 y.o. female who presents for the following: Lesion (On the L neck - tender to the touch has been there about a year and half) and Annual Exam (Fhx of family skin cancer - aunt currently being treated for MM). The patient presents for Total-Body Skin Exam (TBSE) for skin cancer screening and mole check.  The following portions of the chart were reviewed this encounter and updated as appropriate:   Tobacco  Allergies  Meds  Problems  Med Hx  Surg Hx  Fam Hx     Review of Systems:  No other skin or systemic complaints except as noted in HPI or Assessment and Plan.  Objective  Well appearing patient in no apparent distress; mood and affect are within normal limits.  A full examination was performed including scalp, head, eyes, ears, nose, lips, neck, chest, axillae, abdomen, back, buttocks, bilateral upper extremities, bilateral lower extremities, hands, feet, fingers, toes, fingernails, and toenails. All findings within normal limits unless otherwise noted below.   Assessment & Plan  Milium Face Chronic and persistent Benign-appearing.  Observation.  Call clinic for new or changing lesions.  Recommend daily use of broad spectrum spf 30+ sunscreen to sun-exposed areas.  Discussed topical retinoid use versus extraction.  She declines treatment at this time.  Dermatofibroma L ant neck Benign growth possibly related to trauma, such as an insect bite.  Discussed removal (excision), resulting scar and risk of recurrence. Will plan excision due to symptoms.  Preop form given.  Reviewed procedure.  Skin cancer screening  Lentigines - Scattered tan macules - Due to sun exposure - Benign-appering, observe - Recommend daily broad spectrum sunscreen SPF 30+ to sun-exposed areas, reapply every 2 hours as needed. - Call for any changes  Seborrheic Keratoses - Stuck-on, waxy, tan-brown papules and/or plaques  - Benign-appearing -  Discussed benign etiology and prognosis. - Observe - Call for any changes  Melanocytic Nevi - Tan-brown and/or pink-flesh-colored symmetric macules and papules - Benign appearing on exam today - Observation - Call clinic for new or changing moles - Recommend daily use of broad spectrum spf 30+ sunscreen to sun-exposed areas.   Hemangiomas - Red papules - Discussed benign nature - Observe - Call for any changes  Actinic Damage - Chronic condition, secondary to cumulative UV/sun exposure - diffuse scaly erythematous macules with underlying dyspigmentation - Recommend daily broad spectrum sunscreen SPF 30+ to sun-exposed areas, reapply every 2 hours as needed.  - Staying in the shade or wearing long sleeves, sun glasses (UVA+UVB protection) and wide brim hats (4-inch brim around the entire circumference of the hat) are also recommended for sun protection.  - Call for new or changing lesions.  Skin cancer screening performed today.  Return in about 1 year (around 07/08/2022) for surgery of dermatofibroma, TBSE.  Luther Redo, CMA, am acting as scribe for Sarina Ser, MD . Documentation: I have reviewed the above documentation for accuracy and completeness, and I agree with the above.  Sarina Ser, MD

## 2021-07-08 NOTE — Telephone Encounter (Signed)
Last refill 06/09/21 #60 Last office visit 03/17/21 acute Next OV 09/07/21 [facility]

## 2021-07-08 NOTE — Patient Instructions (Addendum)
If you have any questions or concerns for your doctor, please call our main line at [phone] and press option 4 to reach your doctor's medical assistant. If no one answers, please leave a voicemail as directed and we will return your call as soon as possible. Messages left after 4 pm will be answered the following business day.   You may also send us a message via MyChart. We typically respond to MyChart messages within 1-2 business days.  For prescription refills, please ask your pharmacy to contact our office. Our fax number is [phone].  If you have an urgent issue when the clinic is closed that cannot wait until the next business day, you can page your doctor at the number below.    Please note that while we do our best to be available for urgent issues outside of office hours, we are not available 24/7.   If you have an urgent issue and are unable to reach us, you may choose to seek medical care at your doctor's office, retail clinic, urgent care center, or emergency room.  If you have a medical emergency, please immediately call 911 or go to the emergency department.  Pager Numbers  - Dr. Kowalski: [phone]  - Dr. Moye: [phone]  - Dr. Stewart: [phone]  In the event of inclement weather, please call our main line at [phone] for an update on the status of any delays or closures.  Dermatology Medication Tips: Please keep the boxes that topical medications come in in order to help keep track of the instructions about where and how to use these. Pharmacies typically print the medication instructions only on the boxes and not directly on the medication tubes.   If your medication is too expensive, please contact our office at [phone] option 4 or send us a message through MyChart.   We are unable to tell what your co-pay for medications will be in advance as this is different depending on your insurance coverage. However, we may be able to find a substitute  medication at lower cost or fill out paperwork to get insurance to cover a needed medication.   If a prior authorization is required to get your medication covered by your insurance company, please allow us 1-2 business days to complete this process.  Drug prices often vary depending on where the prescription is filled and some pharmacies may offer cheaper prices.  The website www.goodrx.com contains coupons for medications through different pharmacies. The prices here do not account for what the cost may be with help from insurance (it may be cheaper with your insurance), but the website can give you the price if you did not use any insurance.  - You can print the associated coupon and take it with your prescription to the pharmacy.  - You may also stop by our office during regular business hours and pick up a GoodRx coupon card.  - If you need your prescription sent electronically to a different pharmacy, notify our office through [facility] MyChart or by phone at [phone] option 4.        Pre-Operative Instructions  You are scheduled for a surgical procedure at [facility]. We recommend you read the following instructions. If you have any questions or concerns, please call the office at [phone].  Shower and wash the entire body with soap and water the day of your surgery paying special attention to cleansing at and around the planned surgery site.  Avoid aspirin or aspirin containing products   at least fourteen (14) days prior to your surgical procedure and for at least one week (7 Days) after your surgical procedure. If you take aspirin on a regular basis for heart disease or history of stroke or for any other reason, we may recommend you continue taking aspirin but please notify us if you take this on a regular basis. Aspirin can cause more bleeding to occur during surgery as well as prolonged bleeding and bruising after surgery.   Avoid other nonsteroidal pain  medications at least one week prior to surgery and at least one week prior to your surgery. These include medications such as Ibuprofen (Motrin, Advil and Nuprin), Naprosyn, Voltaren, Relafen, etc. If medications are used for therapeutic reasons, please inform us as they can cause increased bleeding or prolonged bleeding during and bruising after surgical procedures.   Please advise us if you are taking any "blood thinner" medications such as Coumadin or Dipyridamole or Plavix or similar medications. These cause increased bleeding and prolonged bleeding during procedures and bruising after surgical procedures. We may have to consider discontinuing these medications briefly prior to and shortly after your surgery if safe to do so.   Please inform us of all medications you are currently taking. All medications that are taken regularly should be taken the day of surgery as you always do. Nevertheless, we need to be informed of what medications you are taking prior to surgery to know whether they will affect the procedure or cause any complications.   Please inform us of any medication allergies. Also inform us of whether you have allergies to Latex or rubber products or whether you have had any adverse reaction to Lidocaine or Epinephrine.  Please inform us of any prosthetic or artificial body parts such as artificial heart valve, joint replacements, etc., or similar condition that might require preoperative antibiotics.   We recommend avoidance of alcohol at least two weeks prior to surgery and continued avoidance for at least two weeks after surgery.   We recommend discontinuation of tobacco smoking at least two weeks prior to surgery and continued abstinence for at least two weeks after surgery.  Do not plan strenuous exercise, strenuous work or strenuous lifting for approximately four weeks after your surgery.   We request if you are unable to make your scheduled surgical appointment, please call us  at least a week in advance or as soon as you are aware of a problem so that we can cancel or reschedule the appointment.   You MAY TAKE TYLENOL (acetaminophen) for pain as it is not a blood thinner.   PLEASE PLAN TO BE IN TOWN FOR TWO WEEKS FOLLOWING SURGERY, THIS IS IMPORTANT SO YOU CAN BE CHECKED FOR DRESSING CHANGES, SUTURE REMOVAL AND TO MONITOR FOR POSSIBLE COMPLICATIONS.  

## 2021-07-10 ENCOUNTER — Encounter: Payer: Self-pay | Admitting: Dermatology

## 2021-08-10 ENCOUNTER — Other Ambulatory Visit: Payer: Self-pay | Admitting: Internal Medicine

## 2021-08-10 NOTE — Telephone Encounter (Signed)
Last filled 07-10-21 #60 Last OV 03-17-21 Acute Next OV 09-07-21 [facility]

## 2021-08-16 ENCOUNTER — Other Ambulatory Visit: Payer: Self-pay | Admitting: Internal Medicine

## 2021-08-16 ENCOUNTER — Other Ambulatory Visit: Payer: Medicare HMO

## 2021-08-16 DIAGNOSIS — Z1231 Encounter for screening mammogram for malignant neoplasm of breast: Secondary | ICD-10-CM

## 2021-09-07 ENCOUNTER — Ambulatory Visit (INDEPENDENT_AMBULATORY_CARE_PROVIDER_SITE_OTHER): Payer: Medicare HMO | Admitting: Internal Medicine

## 2021-09-07 ENCOUNTER — Other Ambulatory Visit: Payer: Self-pay

## 2021-09-07 ENCOUNTER — Encounter: Payer: Self-pay | Admitting: Internal Medicine

## 2021-09-07 VITALS — BP 134/88 | HR 65 | Temp 97.4°F | Ht 65.5 in | Wt 190.0 lb

## 2021-09-07 DIAGNOSIS — I10 Essential (primary) hypertension: Secondary | ICD-10-CM | POA: Diagnosis not present

## 2021-09-07 DIAGNOSIS — Z23 Encounter for immunization: Secondary | ICD-10-CM | POA: Diagnosis not present

## 2021-09-07 DIAGNOSIS — Z0000 Encounter for general adult medical examination without abnormal findings: Secondary | ICD-10-CM | POA: Diagnosis not present

## 2021-09-07 DIAGNOSIS — M7662 Achilles tendinitis, left leg: Secondary | ICD-10-CM

## 2021-09-07 DIAGNOSIS — F39 Unspecified mood [affective] disorder: Secondary | ICD-10-CM

## 2021-09-07 DIAGNOSIS — Z7189 Other specified counseling: Secondary | ICD-10-CM | POA: Insufficient documentation

## 2021-09-07 DIAGNOSIS — N816 Rectocele: Secondary | ICD-10-CM

## 2021-09-07 DIAGNOSIS — Z1211 Encounter for screening for malignant neoplasm of colon: Secondary | ICD-10-CM

## 2021-09-07 DIAGNOSIS — N951 Menopausal and female climacteric states: Secondary | ICD-10-CM | POA: Diagnosis not present

## 2021-09-07 LAB — COMPREHENSIVE METABOLIC PANEL
ALT: 14 U/L (ref 0–35)
AST: 15 U/L (ref 0–37)
Albumin: 4 g/dL (ref 3.5–5.2)
Alkaline Phosphatase: 74 U/L (ref 39–117)
BUN: 12 mg/dL (ref 6–23)
CO2: 32 mEq/L (ref 19–32)
Calcium: 8.8 mg/dL (ref 8.4–10.5)
Chloride: 104 mEq/L (ref 96–112)
Creatinine, Ser: 0.73 mg/dL (ref 0.40–1.20)
GFR: 86.14 mL/min (ref >60.00)
Glucose, Bld: 97 mg/dL (ref 70–99)
Potassium: 4 mEq/L (ref 3.5–5.1)
Sodium: 141 mEq/L (ref 135–145)
Total Bilirubin: 0.5 mg/dL (ref 0.2–1.2)
Total Protein: 6.8 g/dL (ref 6.0–8.3)

## 2021-09-07 LAB — CBC
HCT: 43.3 % (ref 36.0–46.0)
Hemoglobin: 14.4 g/dL (ref 12.0–15.0)
MCHC: 33.3 g/dL (ref 30.0–36.0)
MCV: 98 fl (ref 78.0–100.0)
Platelets: 234 10*3/uL (ref 150.0–400.0)
RBC: 4.42 Mil/uL (ref 3.87–5.11)
RDW: 12 % (ref 11.5–15.5)
WBC: 4.6 10*3/uL (ref 4.0–10.5)

## 2021-09-07 NOTE — Assessment & Plan Note (Signed)
BP Readings from Last 3 Encounters:  09/07/21 134/88  03/17/21 (!) 130/92  02/03/21 136/82   Good control on losartan Will check labs

## 2021-09-07 NOTE — Progress Notes (Signed)
Hearing Screening   500Hz  1000Hz  2000Hz  4000Hz   Right ear 0 0 25 20  Left ear 0 25 20 20    Vision Screening   Right eye Left eye Both eyes  Without correction     With correction 20/20 20/20 20/15

## 2021-09-07 NOTE — Progress Notes (Signed)
Subjective:    Patient ID: [name], female    DOB: [birth year], 65 y.o.   MRN: [id number]  HPI Here for Welcome to Medicare visit and follow up of chronic health conditions This visit occurred during the SARS-CoV-2 public health emergency.  Safety protocols were in place, including screening questions prior to the visit, additional usage of staff PPE, and extensive cleaning of exam room while observing appropriate contact time as indicated for disinfecting solutions.   Reviewed advanced directives Reviewed other doctors--Dr Kowalski/[place]--derm, Dr Briscoe Burns, [facility] No hospitalizations or surgery in the past year No alcohol or tobacco Vision is okay Some chronic trouble with right ear---not ready for hearing aide Stopped exercise when dog got sick--plans to restart walking  Independent with instrumental ADLs Some short term memory problems---nothing worrisome  Still taking the estrogen--- 1 tab every other day Finally not having much of the hot flashes--but still at night (but only brief)  Blood pressure seems to be okay--some fluctuation No chest pain or SOB No dizziness or syncope No palpitations No edema  No recent acid reflux No dysphagia  Uses the xanax---generally 1/2 bid (or less) For anxiety---"I am high anxiety" No depression or anhedonia  Still having heel problems---trouble getting them into shoe Does okay with open back Ready for the next step  Current Outpatient Medications on File Prior to Visit  Medication Sig Dispense Refill   ALPRAZolam (XANAX) 0.5 MG tablet TAKE 1/2 TO 1 TABLET BY MOUTH TWO TIMES A DAY AS NEEDED FOR ANXIETY 60 tablet 0   Calcium Carb-Cholecalciferol 600-200 MG-UNIT TABS Take 3 tablets by mouth daily.     chlorhexidine (PERIDEX) 0.12 % solution SWISH AND SPIT WITH ONE CAPFUL TWICE A DAY AS DIRECTED. 473 mL 5   estradiol (ESTRACE) 1 MG tablet TAKE 1 TABLET BY MOUTH EVERY DAY 90 tablet 1   hydrochlorothiazide  (HYDRODIURIL) 25 MG tablet TAKE ONE TABLET BY MOUTH DAILY 90 tablet 3   latanoprost (XALATAN) 0.005 % ophthalmic solution SMARTSIG:In Eye(s)     losartan (COZAAR) 100 MG tablet TAKE 1 TABLET BY MOUTH DAILY 90 tablet 3   MAGNESIUM PO Take by mouth.     melatonin 5 MG TABS Take 5 mg by mouth.     polyethylene glycol (MIRALAX / GLYCOLAX) 17 g packet Take 17 g by mouth daily.     psyllium (METAMUCIL) 58.6 % powder Take 1 packet by mouth daily.     No current facility-administered medications on file prior to visit.    No Known Allergies  Past Medical History:  Diagnosis Date   Allergic rhinitis due to pollen    Anxiety    Depression    GERD (gastroesophageal reflux disease)     Past Surgical History:  Procedure Laterality Date   ABDOMINAL HYSTERECTOMY  08/1998   BSO fibroids   CHOLECYSTECTOMY  8/12   COLON SURGERY     HERNIA REPAIR  11/2003   RIH (ingram)    Family History  Problem Relation Age of Onset   Stroke Father    Heart disease Brother 65       recent MI   Heart disease Mother    Colon polyps Neg Hx    Stomach cancer Neg Hx     Social History   Socioeconomic History   Marital status: Married    Spouse name: Not on file   Number of children: Not on file   Years of education: Not on file   Highest education level: Not  on file  Occupational History   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    Comment: Retired  Tobacco Use   Smoking status: Never   Smokeless tobacco: Never  Substance and Sexual Activity   Alcohol use: No   Drug use: No   Sexual activity: Not on file  Other Topics Concern   Not on file  Social History Narrative   No formal advanced directives   Son Balinda Quails should be health care POA   Would accept resuscitation attempts but no prolonged ventilation or tube feedings   Social Determinants of Health   Financial Resource Strain: Not on file  Food Insecurity: Not on file  Transportation Needs: Not on file  Physical Activity: Not on file   Stress: Not on file  Social Connections: Not on file  Intimate Partner Violence: Not on file   Review of Systems Appetite is good---trying to be more careful with eating Weight variable--but fairly stable Sleeps okay with melatonin Wears seat belt Recent dental work Bowels still slow with metamucil/miralax--wonders about trying linzess (but is not having a lot of trouble so will hold off) No dysuria or incontinence. Some trouble with rectocele--not sure she is ready for surgery No sig back or joint pains No suspicious skin lesions today    Objective:   Physical Exam Constitutional:      Appearance: Normal appearance.  HENT:     Mouth/Throat:     Comments: No lesions Eyes:     Conjunctiva/sclera: Conjunctivae normal.     Pupils: Pupils are equal, round, and reactive to light.  Cardiovascular:     Rate and Rhythm: Normal rate and regular rhythm.     Pulses: Normal pulses.     Heart sounds: No murmur heard.   No gallop.  Pulmonary:     Effort: Pulmonary effort is normal.     Breath sounds: Normal breath sounds. No wheezing or rales.  Abdominal:     Palpations: Abdomen is soft.     Tenderness: There is no abdominal tenderness.  Musculoskeletal:     Cervical back: Neck supple.     Right lower leg: No edema.     Left lower leg: No edema.  Lymphadenopathy:     Cervical: No cervical adenopathy.  Skin:    General: Skin is warm.     Findings: No rash.  Neurological:     Mental Status: She is alert and oriented to person, place, and time.     Comments: President---"Biden, Obama----Trump" [id number] Recall 3/3  Psychiatric:        Mood and Affect: Mood normal.        Behavior: Behavior normal.           Assessment & Plan:

## 2021-09-07 NOTE — Assessment & Plan Note (Signed)
She is not ready for surgery---will refer to general surgery when she is ready

## 2021-09-07 NOTE — Assessment & Plan Note (Signed)
See social history 

## 2021-09-07 NOTE — Assessment & Plan Note (Signed)
Some symptoms If worsens again, will set up with podiatry

## 2021-09-07 NOTE — Assessment & Plan Note (Signed)
Mostly anxiety Does okay with the regular low dose xanax

## 2021-09-07 NOTE — Assessment & Plan Note (Addendum)
I have personally reviewed the Medicare Annual Wellness questionnaire and have noted 1. The patient's medical and social history 2. Their use of alcohol, tobacco or illicit drugs 3. Their current medications and supplements 4. The patient's functional ability including ADL's, fall risks, home safety risks and hearing or visual             impairment. 5. Diet and physical activities 6. Evidence for depression or mood disorders  The patients weight, height, BMI and visual acuity have been recorded in the chart I have made referrals, counseling and provided education to the patient based review of the above and I have provided the pt with a written personalized care plan for preventive services.  I have provided you with a copy of your personalized plan for preventive services. Please take the time to review along with your updated medication list.  Colonoscopy overdue--will make referral Mammogram schedule for October DEXA also scheduled No pap due to hyster Discussed exercise Bivalent COVID at pharmacy Flu vaccine and pneumovax today

## 2021-09-07 NOTE — Addendum Note (Signed)
Addended by: Pilar Grammes on: 09/07/2021 12:15 PM   Modules accepted: Orders

## 2021-09-07 NOTE — Assessment & Plan Note (Signed)
Using the estrogen every other day Discussed trying to wean more if her symptoms improve

## 2021-09-11 ENCOUNTER — Other Ambulatory Visit: Payer: Self-pay | Admitting: Internal Medicine

## 2021-09-13 NOTE — Telephone Encounter (Signed)
Last filled 08-10-21 #60 Last OV 09-07-21 Next OV 10-09-22 [facility] [place]

## 2021-09-14 DIAGNOSIS — L905 Scar conditions and fibrosis of skin: Secondary | ICD-10-CM | POA: Diagnosis not present

## 2021-09-14 DIAGNOSIS — L298 Other pruritus: Secondary | ICD-10-CM | POA: Diagnosis not present

## 2021-09-14 DIAGNOSIS — B86 Scabies: Secondary | ICD-10-CM | POA: Diagnosis not present

## 2021-09-28 ENCOUNTER — Encounter: Payer: Medicare HMO | Admitting: Dermatology

## 2021-09-30 ENCOUNTER — Other Ambulatory Visit: Payer: Self-pay

## 2021-09-30 ENCOUNTER — Ambulatory Visit
Admission: RE | Admit: 2021-09-30 | Discharge: 2021-09-30 | Disposition: A | Discharge status: Home / Self Care | Payer: Medicare HMO | Source: Ambulatory Visit | Attending: Internal Medicine | Admitting: Internal Medicine

## 2021-09-30 DIAGNOSIS — Z1231 Encounter for screening mammogram for malignant neoplasm of breast: Secondary | ICD-10-CM

## 2021-10-01 ENCOUNTER — Telehealth: Payer: Self-pay | Admitting: Internal Medicine

## 2021-10-01 MED ORDER — LOSARTAN POTASSIUM 100 MG PO TABS: 100.0000 mg | ORAL_TABLET | Freq: Every day | ORAL | 3 refills | Status: DC

## 2021-10-01 NOTE — Telephone Encounter (Signed)
  Encourage patient to contact the pharmacy for refills or they can request refills through [facility]:  Please schedule appointment if longer than 1 year  NEXT APPOINTMENT DATE:  MEDICATION:losartan (COZAAR) 100 MG tablet  Is the patient out of medication?   [id number] [place] [place] [facility] [address]   Let patient know to contact pharmacy at the end of the day to make sure medication is ready.  Please notify patient to allow 48-72 hours to process  CLINICAL FILLS OUT ALL BELOW:   LAST REFILL:  QTY:  REFILL DATE:    OTHER COMMENTS:    Okay for refill?  Please advise

## 2021-10-12 ENCOUNTER — Other Ambulatory Visit: Payer: Self-pay | Admitting: Internal Medicine

## 2021-10-12 NOTE — Telephone Encounter (Signed)
Last filled 09-13-21 #60 Last OV 09-07-21 Next OV 10-07-22 [facility] [place]

## 2021-10-18 ENCOUNTER — Telehealth: Payer: Self-pay | Admitting: Internal Medicine

## 2021-10-18 NOTE — Telephone Encounter (Signed)
Pt wanted to get cologuard ordered  Her last colonoscopy was in 2013 she said

## 2021-10-25 NOTE — Telephone Encounter (Signed)
Asked pt and she is fine with the iFOB. I have mailed it to her.

## 2021-11-12 ENCOUNTER — Other Ambulatory Visit: Payer: Self-pay | Admitting: Internal Medicine

## 2021-11-15 NOTE — Telephone Encounter (Addendum)
Last OV- 09/07/2021 Next OV-10/07/2022 Last Filled-   Xanax - 10/13/2021  Estradiol - 05/18/2021

## 2021-11-17 ENCOUNTER — Other Ambulatory Visit (INDEPENDENT_AMBULATORY_CARE_PROVIDER_SITE_OTHER): Payer: Medicare HMO

## 2021-11-17 DIAGNOSIS — D126 Benign neoplasm of colon, unspecified: Secondary | ICD-10-CM | POA: Diagnosis not present

## 2021-11-17 LAB — FECAL OCCULT BLOOD, IMMUNOCHEMICAL: Fecal Occult Bld: NEGATIVE

## 2021-11-24 ENCOUNTER — Telehealth: Payer: Self-pay | Admitting: Internal Medicine

## 2021-11-24 NOTE — Telephone Encounter (Signed)
Pt called wanting to discuss a referral for anxiety

## 2021-11-24 NOTE — Telephone Encounter (Signed)
Spoke to pt. She feels it is time to see someone about the panic attacks she has at times. She heard the message about [place] having behavioral health specialists. I told her it takes a little while to get in with them. I gave her information on a psychologist I was aware of [place]. She will call insurance and then call him if he is in-network.

## 2021-12-14 ENCOUNTER — Other Ambulatory Visit: Payer: Self-pay | Admitting: Internal Medicine

## 2021-12-14 NOTE — Telephone Encounter (Signed)
Last OV - 09/07/2021 Next OV - 01/18/2022 Last Filled - 11/15/2021

## 2021-12-15 NOTE — Telephone Encounter (Signed)
Pt called to follow up on refill.

## 2021-12-15 NOTE — Telephone Encounter (Signed)
Pt called to f/u on request - advised we received and waiting on provider response

## 2021-12-16 NOTE — Telephone Encounter (Signed)
Left message on VM per DPR that the medication was sent yesterday. Advised her Dr Silvio Pate is out of the office this week. Reminded her that we have a 24-72 hour window for refills.

## 2022-01-13 ENCOUNTER — Other Ambulatory Visit: Payer: Self-pay | Admitting: Internal Medicine

## 2022-01-13 NOTE — Telephone Encounter (Signed)
Last filled 12-15-21 #60 Last OV 09-07-21 Next OV 01-18-22 [facility]

## 2022-01-18 ENCOUNTER — Other Ambulatory Visit: Payer: Self-pay

## 2022-01-18 ENCOUNTER — Encounter: Payer: Self-pay | Admitting: Internal Medicine

## 2022-01-18 ENCOUNTER — Ambulatory Visit (INDEPENDENT_AMBULATORY_CARE_PROVIDER_SITE_OTHER): Payer: Medicare HMO | Admitting: Internal Medicine

## 2022-01-18 VITALS — BP 116/86 | HR 70 | Temp 97.3°F | Ht 65.5 in | Wt 186.0 lb

## 2022-01-18 DIAGNOSIS — R3915 Urgency of urination: Secondary | ICD-10-CM | POA: Insufficient documentation

## 2022-01-18 DIAGNOSIS — N816 Rectocele: Secondary | ICD-10-CM | POA: Diagnosis not present

## 2022-01-18 DIAGNOSIS — F39 Unspecified mood [affective] disorder: Secondary | ICD-10-CM | POA: Diagnosis not present

## 2022-01-18 LAB — POC URINALSYSI DIPSTICK (AUTOMATED)
Bilirubin, UA: NEGATIVE
Blood, UA: NEGATIVE
Glucose, UA: NEGATIVE
Nitrite, UA: NEGATIVE
Protein, UA: NEGATIVE
Spec Grav, UA: 1.015 (ref 1.010–1.025)
Urobilinogen, UA: 1 E.U./dL
pH, UA: 7.5 (ref 5.0–8.0)

## 2022-01-18 NOTE — Assessment & Plan Note (Signed)
No clear infection symptoms Urinalysis shows 1+ leukocytes Will only treat if culture is positive

## 2022-01-18 NOTE — Assessment & Plan Note (Signed)
Goes back 10 years but now worse Likely causing her urinary symptoms Will set up with a different gyn---may want to consider pessary now if this is still an option

## 2022-01-18 NOTE — Addendum Note (Signed)
Addended by: Pilar Grammes on: 01/18/2022 11:59 AM   Modules accepted: Orders

## 2022-01-18 NOTE — Assessment & Plan Note (Signed)
Anxiety is okay despite increased stress Xanax prn only

## 2022-01-18 NOTE — Progress Notes (Signed)
Subjective:    Patient ID: [name], female    DOB: February 20, 1956, 66 y.o.   MRN: [id number]  HPI Here due to difficulty passing urine  Has been going on a while--but got really bad Some better now Very busy all day---feels the bulging is worse ("like a baseball now") Known rectocele after gyn visit  Husband now bed ridden--so lots of work Had just gotten big puppy before that  Current Outpatient Medications on File Prior to Visit  Medication Sig Dispense Refill   ALPRAZolam (XANAX) 0.5 MG tablet TAKE 1/2 TO 1 TABLET BY MOUTH TWICE A DAY AS NEEDED FOR ANXIETY 60 tablet 0   Calcium Carb-Cholecalciferol 600-200 MG-UNIT TABS Take 3 tablets by mouth daily.     chlorhexidine (PERIDEX) 0.12 % solution SWISH AND SPIT WITH ONE CAPFUL TWICE A DAY AS DIRECTED. 473 mL 5   estradiol (ESTRACE) 1 MG tablet TAKE 1 TABLET BY MOUTH EVERY DAY 90 tablet 3   hydrochlorothiazide (HYDRODIURIL) 25 MG tablet TAKE ONE TABLET BY MOUTH DAILY 90 tablet 3   latanoprost (XALATAN) 0.005 % ophthalmic solution SMARTSIG:In Eye(s)     losartan (COZAAR) 100 MG tablet Take 1 tablet (100 mg total) by mouth daily. 90 tablet 3   melatonin 5 MG TABS Take 5 mg by mouth.     polyethylene glycol (MIRALAX / GLYCOLAX) 17 g packet Take 17 g by mouth daily.     psyllium (METAMUCIL) 58.6 % powder Take 1 packet by mouth daily.     No current facility-administered medications on file prior to visit.    No Known Allergies  Past Medical History:  Diagnosis Date   Allergic rhinitis due to pollen    Anxiety    Depression    GERD (gastroesophageal reflux disease)     Past Surgical History:  Procedure Laterality Date   ABDOMINAL HYSTERECTOMY  08/1998   BSO fibroids   CHOLECYSTECTOMY  8/12   COLON SURGERY     HERNIA REPAIR  11/2003   RIH (ingram)    Family History  Problem Relation Age of Onset   Stroke Father    Heart disease Brother 90       recent MI   Heart disease Mother    Colon polyps Neg Hx    Stomach  cancer Neg Hx     Social History   Socioeconomic History   Marital status: Married    Spouse name: Not on file   Number of children: Not on file   Years of education: Not on file   Highest education level: Not on file  Occupational History   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    Comment: Retired  Tobacco Use   Smoking status: Never   Smokeless tobacco: Never  Substance and Sexual Activity   Alcohol use: No   Drug use: No   Sexual activity: Not on file  Other Topics Concern   Not on file  Social History Narrative   No formal advanced directives   Son Balinda Quails should be health care POA   Would accept resuscitation attempts but no prolonged ventilation or tube feedings   Social Determinants of Health   Financial Resource Strain: Not on file  Food Insecurity: Not on file  Transportation Needs: Not on file  Physical Activity: Not on file  Stress: Not on file  Social Connections: Not on file  Intimate Partner Violence: Not on file   Review of Systems No dysuria Does have urinary urgency Anxiety is better overall--but increased stress  Objective:   Physical Exam Constitutional:      Appearance: Normal appearance.  Neurological:     Mental Status: She is alert.  Psychiatric:        Mood and Affect: Mood normal.        Behavior: Behavior normal.           Assessment & Plan:

## 2022-01-19 DIAGNOSIS — H40003 Preglaucoma, unspecified, bilateral: Secondary | ICD-10-CM | POA: Diagnosis not present

## 2022-01-19 LAB — URINE CULTURE
MICRO NUMBER:: 12943386
Result:: NO GROWTH
SPECIMEN QUALITY:: ADEQUATE

## 2022-02-03 ENCOUNTER — Ambulatory Visit
Admission: RE | Admit: 2022-02-03 | Discharge: 2022-02-03 | Disposition: A | Discharge status: Home / Self Care | Payer: Medicare HMO | Source: Ambulatory Visit | Attending: Internal Medicine | Admitting: Internal Medicine

## 2022-02-03 DIAGNOSIS — E2839 Other primary ovarian failure: Secondary | ICD-10-CM

## 2022-02-03 DIAGNOSIS — Z780 Asymptomatic menopausal state: Secondary | ICD-10-CM | POA: Diagnosis not present

## 2022-02-10 ENCOUNTER — Other Ambulatory Visit: Payer: Self-pay | Admitting: Internal Medicine

## 2022-02-12 ENCOUNTER — Other Ambulatory Visit: Payer: Self-pay | Admitting: Internal Medicine

## 2022-02-13 NOTE — Telephone Encounter (Signed)
Last filled 01-13-22 #60 LAst OV 01-18-22 Acute Next OV 10-07-22 [facility]

## 2022-03-11 ENCOUNTER — Telehealth: Payer: Self-pay | Admitting: Internal Medicine

## 2022-03-11 ENCOUNTER — Other Ambulatory Visit: Payer: Self-pay | Admitting: Internal Medicine

## 2022-03-11 MED ORDER — IBUPROFEN 800 MG PO TABS: 800.0000 mg | ORAL_TABLET | Freq: Two times a day (BID) | ORAL | 0 refills | Status: DC | PRN

## 2022-03-11 NOTE — Telephone Encounter (Signed)
Patient wants to know if she can have a prescription for '800mg'$  IBUPROFEN  ?States she has foot and back pain and has been seen for this pain.  ? ?Please advise   ?

## 2022-03-11 NOTE — Telephone Encounter (Signed)
Spoke to pt. She said she does not take it everyday. ?

## 2022-03-11 NOTE — Telephone Encounter (Signed)
Last filled 02-14-22 #60 ?Last OV 01-18-22 ?Next OV 10-07-22 ?[facility] ?

## 2022-03-11 NOTE — Telephone Encounter (Signed)
Please let her know that I sent the Rx---but she should not take this regularly due to potential side effects. ?2 a day at most and only when needed. ?4 of the OTC ibuprofen would be the same thing ?

## 2022-03-30 ENCOUNTER — Encounter: Payer: Self-pay | Admitting: Obstetrics and Gynecology

## 2022-03-30 ENCOUNTER — Ambulatory Visit: Payer: Medicare HMO | Admitting: Obstetrics and Gynecology

## 2022-03-30 VITALS — BP 141/84 | HR 76 | Ht 64.0 in | Wt 184.0 lb

## 2022-03-30 DIAGNOSIS — N393 Stress incontinence (female) (male): Secondary | ICD-10-CM

## 2022-03-30 DIAGNOSIS — N816 Rectocele: Secondary | ICD-10-CM

## 2022-03-30 DIAGNOSIS — K469 Unspecified abdominal hernia without obstruction or gangrene: Secondary | ICD-10-CM | POA: Diagnosis not present

## 2022-03-30 DIAGNOSIS — R350 Frequency of micturition: Secondary | ICD-10-CM | POA: Diagnosis not present

## 2022-03-30 LAB — POCT URINALYSIS DIPSTICK
Appearance: NORMAL
Bilirubin, UA: NEGATIVE
Glucose, UA: NEGATIVE
Ketones, UA: NEGATIVE
Leukocytes, UA: NEGATIVE
Nitrite, UA: NEGATIVE
Protein, UA: NEGATIVE
Spec Grav, UA: 1.02 (ref 1.010–1.025)
Urobilinogen, UA: 0.2 E.U./dL
pH, UA: 7 (ref 5.0–8.0)
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
## 2022-04-05 ENCOUNTER — Ambulatory Visit: Payer: Medicare HMO | Admitting: Podiatry

## 2022-04-05 ENCOUNTER — Ambulatory Visit (INDEPENDENT_AMBULATORY_CARE_PROVIDER_SITE_OTHER): Payer: Medicare HMO

## 2022-04-05 ENCOUNTER — Ambulatory Visit: Payer: Medicare HMO

## 2022-04-05 DIAGNOSIS — M25572 Pain in left ankle and joints of left foot: Secondary | ICD-10-CM | POA: Diagnosis not present

## 2022-04-05 DIAGNOSIS — M7752 Other enthesopathy of left foot: Secondary | ICD-10-CM

## 2022-04-12 ENCOUNTER — Ambulatory Visit: Payer: Medicare HMO | Admitting: Obstetrics and Gynecology

## 2022-04-12 ENCOUNTER — Encounter: Payer: Self-pay | Admitting: Obstetrics and Gynecology

## 2022-04-12 VITALS — BP 117/81 | HR 67

## 2022-04-12 DIAGNOSIS — N816 Rectocele: Secondary | ICD-10-CM

## 2022-04-12 DIAGNOSIS — K469 Unspecified abdominal hernia without obstruction or gangrene: Secondary | ICD-10-CM

## 2022-04-12 NOTE — Progress Notes (Addendum)
[name] ? ? ?Subjective:  ?  ? ?Chief Complaint: Pessary Fitting [name] is a 66 y.o. female here for a pessary fitting.) ? ?History of Present Illness: ?[name] is a 66 y.o. female with stage III pelvic organ prolapse who presents today for a pessary fitting.  ? ? ?Past Medical History: ?Patient  has a past medical history of Allergic rhinitis due to pollen, Anxiety, Depression, and GERD (gastroesophageal reflux disease).  ? ?Past Surgical History: ?She  has a past surgical history that includes Abdominal hysterectomy (08/1998); Hernia repair (11/2003); Colon surgery; and Cholecystectomy (8/12).  ? ?Medications: ?She has a current medication list which includes the following prescription(s): alprazolam, calcium carb-cholecalciferol, chlorhexidine, estradiol, hydrochlorothiazide, ibuprofen, latanoprost, losartan, melatonin, polyethylene glycol, and psyllium.  ? ?Allergies: ?Patient has No Known Allergies.  ? ?Social History: ?Patient  reports that she has never smoked. She has never used smokeless tobacco. She reports that she does not drink alcohol and does not use drugs.  ? ?  ? ?Objective:  ?  ?BP 117/81   Pulse 67  ?Gen: No apparent distress, A&O x 3. ?Pelvic Exam: Normal external female genitalia; Bartholin's and Skene's glands normal in appearance; urethral meatus normal in appearance, no urethral masses or discharge.  ? ?Tried #5 ring, 2-3/4in and 3in gellhorn pessaries. These were all expelled with standing and bending. A size #5 cube pessary was fitted. It was comfortable, stayed in place with valsalva and was an appropriate size on examination, with one finger fitting between the pessary and the vaginal walls. The patient was able to demonstrate placement. [id number], Exp 03/03/25 ? ?Of note- prolapse appeared more advanced today than on prior POP Q ? ?POP-Q (03/30/22):  ?  ?POP-Q ?  ?-2  ?                                          Aa   ?-2 ?                                           Ba   ?-5  ?                                            C  ?  ?7  ?                                          Gh   ?4  ?                                          Pb   ?7  ?                                          tvl  ?  ?3  ?  Ap   ?3  ?                                          Bp   ?   ?                                            D  ?  ?  ? ? ? ?Assessment/Plan:  ?  ?Assessment: ?[name] is a 66 y.o. with stage III pelvic organ prolapse who presents for a pessary fitting. ? ?Plan: ?She was fitted with a #5 cube pessary. She will keep the pessary in place until next visit, although she would like to try to manage herself eventually and demonstrate placement of pessary today.  ? ?Follow-up in 2-3 weeks for a pessary check or sooner as needed.  ?All questions were answered. ?  ? ?Sherry Folds, MD ? ?

## 2022-04-14 ENCOUNTER — Other Ambulatory Visit: Payer: Self-pay | Admitting: Internal Medicine

## 2022-04-14 NOTE — Telephone Encounter (Signed)
Last filled 03-14-22 #60 ?Last OV 01-18-22 ?Next OV 10-07-22 ?[facility] ?

## 2022-04-19 DIAGNOSIS — M7752 Other enthesopathy of left foot: Secondary | ICD-10-CM | POA: Diagnosis not present

## 2022-04-19 MED ORDER — BETAMETHASONE SOD PHOS & ACET 6 (3-3) MG/ML IJ SUSP
3.0000 mg | Freq: Once | INTRAMUSCULAR | Status: AC
  Administered 2022-04-19: 3 mg via INTRA_ARTICULAR

## 2022-04-19 NOTE — Progress Notes (Signed)
? ?  Subjective:  ?66 y.o. female presenting today as a new patient for evaluation of left ankle pain.  Sudden onset whenever she went hiking about 2 weeks ago.  Patient reports that she walked on uneven pavement and has been having ankle pain ever since.  She has been taking Motrin 800 mg daily.  She presents for further treatment and evaluation. ? ? ?Past Medical History:  ?Diagnosis Date  ? Allergic rhinitis due to pollen   ? Anxiety   ? Depression   ? GERD (gastroesophageal reflux disease)   ? ?Past Surgical History:  ?Procedure Laterality Date  ? ABDOMINAL HYSTERECTOMY  08/1998  ? BSO fibroids  ? CHOLECYSTECTOMY  8/12  ? COLON SURGERY    ? HERNIA REPAIR  11/2003  ? RIH (ingram)  ? ?No Known Allergies ? ? ?Objective / Physical Exam:  ?General:  ?The patient is alert and oriented x3 in no acute distress. ?Dermatology:  ?Skin is warm, dry and supple bilateral lower extremities. Negative for open lesions or macerations. ?Vascular:  ?Palpable pedal pulses bilaterally. No edema or erythema noted. Capillary refill within normal limits. ?Neurological:  ?Epicritic and protective threshold grossly intact bilaterally.  ?Musculoskeletal Exam:  ?Pain on palpation to the anterior lateral medial aspects of the patient's left ankle. Mild edema noted. Range of motion within normal limits to all pedal and ankle joints bilateral. Muscle strength 5/5 in all groups bilateral.  ? ?Radiographic Exam:  ?Normal osseous mineralization. Joint spaces preserved. No fracture/dislocation/boney destruction.   ? ?Assessment: ?1.  Capsulitis left ankle ? ?Plan of Care:  ?1. Patient was evaluated. X-Rays reviewed.  ?2. Injection of 0.5 mL Celestone Soluspan injected in the patient's left ankle. ?3.  Continue Motrin 800 mg 3 times daily as needed ?4.  Ankle brace dispensed to support the ankle.  Recommend good supportive shoes and sneakers ?5.  Return to clinic 4 weeks ? ? ?Edrick Kins, DPM ?[place] ? ?Dr. Edrick Kins, DPM   ?  ?[place]                                        ?[place],  [address]                ?Office [phone]  ?Fax [phone] ? ? ? ? ? ? ?

## 2022-04-27 ENCOUNTER — Other Ambulatory Visit: Payer: Self-pay | Admitting: Internal Medicine

## 2022-05-13 ENCOUNTER — Other Ambulatory Visit: Payer: Self-pay | Admitting: Internal Medicine

## 2022-05-13 NOTE — Telephone Encounter (Signed)
Last filled 04-14-22 #60 Last OV 01-18-22 Next OV 10-07-22 [facility]

## 2022-05-24 ENCOUNTER — Ambulatory Visit: Payer: Medicare HMO | Admitting: Obstetrics and Gynecology

## 2022-06-13 ENCOUNTER — Other Ambulatory Visit: Payer: Self-pay | Admitting: Internal Medicine

## 2022-07-13 ENCOUNTER — Ambulatory Visit: Payer: Medicare HMO | Admitting: Dermatology

## 2022-07-13 ENCOUNTER — Other Ambulatory Visit: Payer: Self-pay | Admitting: Internal Medicine

## 2022-07-13 DIAGNOSIS — L821 Other seborrheic keratosis: Secondary | ICD-10-CM

## 2022-07-13 DIAGNOSIS — L578 Other skin changes due to chronic exposure to nonionizing radiation: Secondary | ICD-10-CM

## 2022-07-13 DIAGNOSIS — L814 Other melanin hyperpigmentation: Secondary | ICD-10-CM | POA: Diagnosis not present

## 2022-07-13 DIAGNOSIS — Z1283 Encounter for screening for malignant neoplasm of skin: Secondary | ICD-10-CM | POA: Diagnosis not present

## 2022-07-13 DIAGNOSIS — D1800 Hemangioma unspecified site: Secondary | ICD-10-CM | POA: Diagnosis not present

## 2022-07-13 DIAGNOSIS — L918 Other hypertrophic disorders of the skin: Secondary | ICD-10-CM

## 2022-07-13 DIAGNOSIS — D229 Melanocytic nevi, unspecified: Secondary | ICD-10-CM

## 2022-07-13 NOTE — Progress Notes (Signed)
To  Follow-Up Visit   Subjective  [name] is a 66 y.o. female who presents for the following: Total body skin exam (Pt gets itchy spot on back). The patient presents for Total-Body Skin Exam (TBSE) for skin cancer screening and mole check.  The patient has spots, moles and lesions to be evaluated, some may be new or changing and the patient has concerns that these could be cancer.   The following portions of the chart were reviewed this encounter and updated as appropriate:   Tobacco  Allergies  Meds  Problems  Med Hx  Surg Hx  Fam Hx     Review of Systems:  No other skin or systemic complaints except as noted in HPI or Assessment and Plan.  Objective  Well appearing patient in no apparent distress; mood and affect are within normal limits.  A full examination was performed including scalp, head, eyes, ears, nose, lips, neck, chest, axillae, abdomen, back, buttocks, bilateral upper extremities, bilateral lower extremities, hands, feet, fingers, toes, fingernails, and toenails. All findings within normal limits unless otherwise noted below.  Mid Back Stuck-on, waxy, tan-brown papules and plaque -- Discussed benign etiology and prognosis.    Assessment & Plan   Lentigines - Scattered tan macules - Due to sun exposure - Benign-appearing, observe - Recommend daily broad spectrum sunscreen SPF 30+ to sun-exposed areas, reapply every 2 hours as needed. - Call for any changes - back  Seborrheic Keratoses - Stuck-on, waxy, tan-brown papules and/or plaques  - Benign-appearing - Discussed benign etiology and prognosis. - Observe - Call for any changes - trunk  Melanocytic Nevi - Tan-brown and/or pink-flesh-colored symmetric macules and papules - Benign appearing on exam today - Observation - Call clinic for new or changing moles - Recommend daily use of broad spectrum spf 30+ sunscreen to sun-exposed areas.  - trunk  Hemangiomas - Red papules - Discussed benign  nature - Observe - Call for any changes - abdomen, breast  Actinic Damage - Chronic condition, secondary to cumulative UV/sun exposure - diffuse scaly erythematous macules with underlying dyspigmentation - Recommend daily broad spectrum sunscreen SPF 30+ to sun-exposed areas, reapply every 2 hours as needed.  - Staying in the shade or wearing long sleeves, sun glasses (UVA+UVB protection) and wide brim hats (4-inch brim around the entire circumference of the hat) are also recommended for sun protection.  - Call for new or changing lesions.  Acrochordons (Skin Tags) - Fleshy, skin-colored pedunculated papules - Benign appearing.  - Observe. - If desired, they can be removed with an in office procedure that is not covered by insurance. - Please call the clinic if you notice any new or changing lesions.  - axilla  Skin cancer screening performed today.  Seborrheic keratosis Mid Back Hx of pruritis, pt declines treatment  Return in about 1 year (around 07/14/2023) for TBSE.  I, Othelia Pulling, RMA, am acting as scribe for Sarina Ser, MD . Documentation: I have reviewed the above documentation for accuracy and completeness, and I agree with the above.  Sarina Ser, MD

## 2022-07-13 NOTE — Telephone Encounter (Signed)
Last filled 06-13-22 #60 Last OV 01-18-22 Next OV 10-07-22 [facility]

## 2022-07-13 NOTE — Patient Instructions (Addendum)
Seborrheic Keratosis  What causes seborrheic keratoses? Seborrheic keratoses are harmless, common skin growths that first appear during adult life.  As time goes by, more growths appear.  Some people may develop a large number of them.  Seborrheic keratoses appear on both covered and uncovered body parts.  They are not caused by sunlight.  The tendency to develop seborrheic keratoses can be inherited.  They vary in color from skin-colored to gray, brown, or even black.  They can be either smooth or have a rough, warty surface.   Seborrheic keratoses are superficial and look as if they were stuck on the skin.  Under the microscope this type of keratosis looks like layers upon layers of skin.  That is why at times the top layer may seem to fall off, but the rest of the growth remains and re-grows.    Treatment Seborrheic keratoses do not need to be treated, but can easily be removed in the office.  Seborrheic keratoses often cause symptoms when they rub on clothing or jewelry.  Lesions can be in the way of shaving.  If they become inflamed, they can cause itching, soreness, or burning.  Removal of a seborrheic keratosis can be accomplished by freezing, burning, or surgery. If any spot bleeds, scabs, or grows rapidly, please return to have it checked, as these can be an indication of a skin cancer.   Due to recent changes in healthcare laws, you may see results of your pathology and/or laboratory studies on MyChart before the doctors have had a chance to review them. We understand that in some cases there may be results that are confusing or concerning to you. Please understand that not all results are received at the same time and often the doctors may need to interpret multiple results in order to provide you with the best plan of care or course of treatment. Therefore, we ask that you please give us 2 business days to thoroughly review all your results before contacting the office for clarification.  Should we see a critical lab result, you will be contacted sooner.   If You Need Anything After Your Visit  If you have any questions or concerns for your doctor, please call our main line at [phone] and press option 4 to reach your doctor's medical assistant. If no one answers, please leave a voicemail as directed and we will return your call as soon as possible. Messages left after 4 pm will be answered the following business day.   You may also send us a message via MyChart. We typically respond to MyChart messages within 1-2 business days.  For prescription refills, please ask your pharmacy to contact our office. Our fax number is [phone].  If you have an urgent issue when the clinic is closed that cannot wait until the next business day, you can page your doctor at the number below.    Please note that while we do our best to be available for urgent issues outside of office hours, we are not available 24/7.   If you have an urgent issue and are unable to reach us, you may choose to seek medical care at your doctor's office, retail clinic, urgent care center, or emergency room.  If you have a medical emergency, please immediately call 911 or go to the emergency department.  Pager Numbers  - Dr. Kowalski: [phone]  - Dr. Moye: [phone]  - Dr. Stewart: [phone]  In the event of inclement weather, please call our main line   at [phone] for an update on the status of any delays or closures.  Dermatology Medication Tips: Please keep the boxes that topical medications come in in order to help keep track of the instructions about where and how to use these. Pharmacies typically print the medication instructions only on the boxes and not directly on the medication tubes.   If your medication is too expensive, please contact our office at [phone] option 4 or send us a message through MyChart.   We are unable to tell what your co-pay for medications will be  in advance as this is different depending on your insurance coverage. However, we may be able to find a substitute medication at lower cost or fill out paperwork to get insurance to cover a needed medication.   If a prior authorization is required to get your medication covered by your insurance company, please allow us 1-2 business days to complete this process.  Drug prices often vary depending on where the prescription is filled and some pharmacies may offer cheaper prices.  The website www.goodrx.com contains coupons for medications through different pharmacies. The prices here do not account for what the cost may be with help from insurance (it may be cheaper with your insurance), but the website can give you the price if you did not use any insurance.  - You can print the associated coupon and take it with your prescription to the pharmacy.  - You may also stop by our office during regular business hours and pick up a GoodRx coupon card.  - If you need your prescription sent electronically to a different pharmacy, notify our office through [facility] MyChart or by phone at [phone] option 4.     Si Usted Necesita Algo Despus de Su Visita  Tambin puede enviarnos un mensaje a travs de MyChart. Por lo general respondemos a los mensajes de MyChart en el transcurso de 1 a 2 das hbiles.  Para renovar recetas, por favor pida a su farmacia que se ponga en contacto con nuestra oficina. Nuestro nmero de fax es el [phone].  Si tiene un asunto urgente cuando la clnica est cerrada y que no puede esperar hasta el siguiente da hbil, puede llamar/localizar a su doctor(a) al nmero que aparece a continuacin.   Por favor, tenga en cuenta que aunque hacemos todo lo posible para estar disponibles para asuntos urgentes fuera del horario de oficina, no estamos disponibles las 24 horas del da, los 7 das de la semana.   Si tiene un problema urgente y no puede comunicarse con nosotros,  puede optar por buscar atencin mdica  en el consultorio de su doctor(a), en una clnica privada, en un centro de atencin urgente o en una sala de emergencias.  Si tiene una emergencia mdica, por favor llame inmediatamente al 911 o vaya a la sala de emergencias.  Nmeros de bper  - Dr. Kowalski: [phone]  - Dra. Moye: [phone]  - Dra. Stewart: [phone]  En caso de inclemencias del tiempo, por favor llame a nuestra lnea principal al [phone] para una actualizacin sobre el estado de cualquier retraso o cierre.  Consejos para la medicacin en dermatologa: Por favor, guarde las cajas en las que vienen los medicamentos de uso tpico para ayudarle a seguir las instrucciones sobre dnde y cmo usarlos. Las farmacias generalmente imprimen las instrucciones del medicamento slo en las cajas y no directamente en los tubos del medicamento.   Si su medicamento es muy caro, por favor, pngase en contacto   con nuestra oficina llamando al [phone] y presione la opcin 4 o envenos un mensaje a travs de MyChart.   No podemos decirle cul ser su copago por los medicamentos por adelantado ya que esto es diferente dependiendo de la cobertura de su seguro. Sin embargo, es posible que podamos encontrar un medicamento sustituto a menor costo o llenar un formulario para que el seguro cubra el medicamento que se considera necesario.   Si se requiere una autorizacin previa para que su compaa de seguros cubra su medicamento, por favor permtanos de 1 a 2 das hbiles para completar este proceso.  Los precios de los medicamentos varan con frecuencia dependiendo del lugar de dnde se surte la receta y alguna farmacias pueden ofrecer precios ms baratos.  El sitio web www.goodrx.com tiene cupones para medicamentos de diferentes farmacias. Los precios aqu no tienen en cuenta lo que podra costar con la ayuda del seguro (puede ser ms barato con su seguro), pero el sitio web puede darle el  precio si no utiliz ningn seguro.  - Puede imprimir el cupn correspondiente y llevarlo con su receta a la farmacia.  - Tambin puede pasar por nuestra oficina durante el horario de atencin regular y recoger una tarjeta de cupones de GoodRx.  - Si necesita que su receta se enve electrnicamente a una farmacia diferente, informe a nuestra oficina a travs de MyChart de [place] o por telfono llamando al [phone] y presione la opcin 4.  

## 2022-07-17 ENCOUNTER — Encounter: Payer: Self-pay | Admitting: Dermatology

## 2022-07-19 DIAGNOSIS — Z0100 Encounter for examination of eyes and vision without abnormal findings: Secondary | ICD-10-CM | POA: Diagnosis not present

## 2022-07-19 DIAGNOSIS — H52203 Unspecified astigmatism, bilateral: Secondary | ICD-10-CM | POA: Diagnosis not present

## 2022-08-12 ENCOUNTER — Other Ambulatory Visit: Payer: Self-pay | Admitting: Internal Medicine

## 2022-08-12 NOTE — Telephone Encounter (Signed)
Last filled 07-13-22 #60 Last OV 01-18-22 Next OV 10-07-22 [facility]

## 2022-08-24 ENCOUNTER — Other Ambulatory Visit: Payer: Self-pay | Admitting: Internal Medicine

## 2022-09-13 ENCOUNTER — Other Ambulatory Visit: Payer: Self-pay | Admitting: Internal Medicine

## 2022-09-13 NOTE — Telephone Encounter (Signed)
Last filled 08-14-22 #60 Last OV 01-18-22 Next OV 10-07-22 [facility]

## 2022-09-16 ENCOUNTER — Telehealth: Payer: Self-pay

## 2022-09-16 NOTE — Telephone Encounter (Signed)
Noted  

## 2022-09-16 NOTE — Telephone Encounter (Signed)
Pt was exposed to + covid person (not wearing a mask) on 09/10/22. Pt tested neg covid on 09/14/22.  Pt tested + home covid test on 09/16/22.pt said no covid symptoms until 09/16/22 with slight H/A pain level 3 and dry cough; no other symptoms. Pt only wants to see Dr Silvio Pate who is out of office this afternoon; offered to schedule pt an appt with different provider but pt declined. Pt scheduled VV with Dr Silvio Pate on 09/19/22 at 9:30.Self quarantine, drink plenty of fluids, rest, and take Tylenol for fever. UC & ED precautions given and pt voiced understanding. Sending note to [facility] CMA and Romilda Garret NP who is in office this afternoon.

## 2022-09-19 ENCOUNTER — Telehealth (INDEPENDENT_AMBULATORY_CARE_PROVIDER_SITE_OTHER): Payer: Medicare HMO | Admitting: Internal Medicine

## 2022-09-19 ENCOUNTER — Encounter: Payer: Self-pay | Admitting: Internal Medicine

## 2022-09-19 DIAGNOSIS — U071 COVID-19: Secondary | ICD-10-CM | POA: Diagnosis not present

## 2022-09-19 MED ORDER — NIRMATRELVIR/RITONAVIR (PAXLOVID)TABLET: 3.0000 | ORAL_TABLET | Freq: Two times a day (BID) | ORAL | 0 refills | Status: AC

## 2022-09-19 NOTE — Assessment & Plan Note (Signed)
Moderate systemic symptoms but no SOB Will continue tylenol Will give paxlovid--she will hold the xanax/estradiol while on To ER if breathing worsens

## 2022-09-19 NOTE — Progress Notes (Signed)
Subjective:    Patient ID: [name], female    DOB: [birth year], 66 y.o.   MRN: [id number]  HPI Video virtual visit due to COVID infection Identification done Reviewed limitations and billing and she gave consent Participants---patient in her home and I am in my office  Started feeling sick 3 days ago Sore throat, diarrhea, fever, chills/sweats, body aches Sneezing Some light sensitivity  Nausea Cough with phlegm---just a bit No SOB  Using tylenol---some help  Current Outpatient Medications on File Prior to Visit  Medication Sig Dispense Refill   ALPRAZolam (XANAX) 0.5 MG tablet TAKE 1/2 TO 1 TABLET BY MOUTH TWICE A DAY AS NEEDED FOR ANXIETY 60 tablet 0   Calcium Carb-Cholecalciferol 600-200 MG-UNIT TABS Take 3 tablets by mouth daily.     chlorhexidine (PERIDEX) 0.12 % solution SWISH AND SPIT WITH ONE CAPFUL TWICE A DAY AS DIRECTED. 473 mL 5   estradiol (ESTRACE) 1 MG tablet TAKE 1 TABLET BY MOUTH EVERY DAY 90 tablet 3   hydrochlorothiazide (HYDRODIURIL) 25 MG tablet TAKE ONE TABLET BY MOUTH DAILY 90 tablet 3   ibuprofen (ADVIL) 800 MG tablet TAKE 1 TABLET BY MOUTH TWICE A DAY AS NEEDED 100 tablet 0   latanoprost (XALATAN) 0.005 % ophthalmic solution SMARTSIG:In Eye(s)     losartan (COZAAR) 100 MG tablet Take 1 tablet (100 mg total) by mouth daily. 90 tablet 3   Multiple Vitamins-Minerals (HAIR SKIN AND NAILS FORMULA PO) Take by mouth.     polyethylene glycol (MIRALAX / GLYCOLAX) 17 g packet Take 17 g by mouth daily.     psyllium (METAMUCIL) 58.6 % powder Take 1 packet by mouth daily.     No current facility-administered medications on file prior to visit.    No Known Allergies  Past Medical History:  Diagnosis Date   Allergic rhinitis due to pollen    Anxiety    Depression    GERD (gastroesophageal reflux disease)     Past Surgical History:  Procedure Laterality Date   ABDOMINAL HYSTERECTOMY  08/1998   BSO fibroids   CHOLECYSTECTOMY  8/12   COLON SURGERY      HERNIA REPAIR  11/2003   RIH (ingram)    Family History  Problem Relation Age of Onset   Heart disease Mother    Stroke Father    Heart disease Brother 91       recent MI   Colon polyps Neg Hx    Stomach cancer Neg Hx     Social History   Socioeconomic History   Marital status: Married    Spouse name: Not on file   Number of children: Not on file   Years of education: Not on file   Highest education level: Not on file  Occupational History   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    Comment: Retired  Tobacco Use   Smoking status: Never    Passive exposure: Past   Smokeless tobacco: Never  Vaping Use   Vaping Use: Never used  Substance and Sexual Activity   Alcohol use: No   Drug use: No   Sexual activity: Not Currently  Other Topics Concern   Not on file  Social History Narrative   No formal advanced directives   Son Balinda Quails should be health care POA   Would accept resuscitation attempts but no prolonged ventilation or tube feedings   Social Determinants of Health   Financial Resource Strain: Not on file  Food Insecurity: Not on file  Transportation Needs: Not  on file  Physical Activity: Not on file  Stress: Not on file  Social Connections: Not on file  Intimate Partner Violence: Not on file   Review of Systems Drinking a lot of water Eating a little--like soup    Objective:   Physical Exam Constitutional:      Comments: In bed--appears mildly ill  Pulmonary:     Effort: Pulmonary effort is normal. No respiratory distress.     Comments: Slight hoarse voice Neurological:     Mental Status: She is alert.            Assessment & Plan:

## 2022-10-07 ENCOUNTER — Ambulatory Visit (INDEPENDENT_AMBULATORY_CARE_PROVIDER_SITE_OTHER): Payer: Medicare HMO | Admitting: Internal Medicine

## 2022-10-07 ENCOUNTER — Encounter: Payer: Self-pay | Admitting: Internal Medicine

## 2022-10-07 VITALS — BP 122/80 | HR 70 | Temp 97.0°F | Ht 65.5 in | Wt 186.0 lb

## 2022-10-07 DIAGNOSIS — F39 Unspecified mood [affective] disorder: Secondary | ICD-10-CM | POA: Diagnosis not present

## 2022-10-07 DIAGNOSIS — I10 Essential (primary) hypertension: Secondary | ICD-10-CM | POA: Diagnosis not present

## 2022-10-07 DIAGNOSIS — Z0000 Encounter for general adult medical examination without abnormal findings: Secondary | ICD-10-CM | POA: Diagnosis not present

## 2022-10-07 DIAGNOSIS — N951 Menopausal and female climacteric states: Secondary | ICD-10-CM

## 2022-10-07 DIAGNOSIS — Z1211 Encounter for screening for malignant neoplasm of colon: Secondary | ICD-10-CM

## 2022-10-07 DIAGNOSIS — Z23 Encounter for immunization: Secondary | ICD-10-CM | POA: Diagnosis not present

## 2022-10-07 LAB — CBC
HCT: 42.5 % (ref 36.0–46.0)
Hemoglobin: 14.2 g/dL (ref 12.0–15.0)
MCHC: 33.5 g/dL (ref 30.0–36.0)
MCV: 97.4 fl (ref 78.0–100.0)
Platelets: 244 10*3/uL (ref 150.0–400.0)
RBC: 4.36 Mil/uL (ref 3.87–5.11)
RDW: 12.6 % (ref 11.5–15.5)
WBC: 5 10*3/uL (ref 4.0–10.5)

## 2022-10-07 LAB — COMPREHENSIVE METABOLIC PANEL
ALT: 14 U/L (ref 0–35)
AST: 17 U/L (ref 0–37)
Albumin: 4 g/dL (ref 3.5–5.2)
Alkaline Phosphatase: 76 U/L (ref 39–117)
BUN: 15 mg/dL (ref 6–23)
CO2: 27 mEq/L (ref 19–32)
Calcium: 9.4 mg/dL (ref 8.4–10.5)
Chloride: 103 mEq/L (ref 96–112)
Creatinine, Ser: 0.81 mg/dL (ref 0.40–1.20)
GFR: 75.46 mL/min (ref >60.00)
Glucose, Bld: 95 mg/dL (ref 70–99)
Potassium: 3.9 mEq/L (ref 3.5–5.1)
Sodium: 139 mEq/L (ref 135–145)
Total Bilirubin: 0.6 mg/dL (ref 0.2–1.2)
Total Protein: 6.9 g/dL (ref 6.0–8.3)

## 2022-10-07 LAB — LIPID PANEL
Cholesterol: 180 mg/dL (ref 0–200)
HDL: 41.5 mg/dL (ref >39.00)
NonHDL: 138.08
Total CHOL/HDL Ratio: 4
Triglycerides: 203 mg/dL — ABNORMAL HIGH (ref 0.0–149.0)
VLDL: 40.6 mg/dL — ABNORMAL HIGH (ref 0.0–40.0)

## 2022-10-07 LAB — LDL CHOLESTEROL, DIRECT: Direct LDL: 97 mg/dL

## 2022-10-07 MED ORDER — LOSARTAN POTASSIUM 100 MG PO TABS: 100.0000 mg | ORAL_TABLET | Freq: Every day | ORAL | 3 refills | Status: DC

## 2022-10-07 NOTE — Assessment & Plan Note (Signed)
BP Readings from Last 3 Encounters:  10/07/22 122/80  09/19/22 128/87  04/12/22 117/81   Doing fine on HCTZ 25 and losartan 100

## 2022-10-07 NOTE — Progress Notes (Signed)
Hearing Screening - Comments:: Passed whisper test Vision Screening - Comments:: July 2023  

## 2022-10-07 NOTE — Addendum Note (Signed)
Addended by: Viviana Simpler I on: 10/07/2022 10:12 AM   Modules accepted: Orders

## 2022-10-07 NOTE — Assessment & Plan Note (Signed)
Still has anxiety with panic Uses the xanax prn Only occasional depressed mood

## 2022-10-07 NOTE — Assessment & Plan Note (Signed)
Mild symptoms persist--but no worse off the estrogen

## 2022-10-07 NOTE — Progress Notes (Signed)
Subjective:    Patient ID: Jimmy Picket, female    DOB: Dec 21, 1955, 66 y.o.   MRN: [id number]  HPI Here for Medicare wellness visit and follow up of chronic health conditions Reviewed advanced directives Reviewed other doctors---Dr Richardson--ophthal, Drs Randa Spike, Dr Schroeder-gyn, Dr Lossie Faes med, [facility] No hospitalizations or surgery this year Vision is okay Hearing has faded some---reduced in right ear No alcohol or tobacco Stopped exercise with ankle problems---but busy with housework, yardwork, etc No falls Chronic mood issues Independent with instrumental ADLs Some memory issues--?worse since COVID  Got hit hard with COVID recently Finally feeling better now  No chest pain or SOB (other than with COVID) No dizziness or syncope No palpitations No edema  Off estrogen now--since COVID Symptoms are not much different--just some hot flashes  Still gets panic attacks---takes the xanax then (will even get chest pain and think she is dying). Now more used to it---needs to isolate and better in the cold It generally helps quickly---but then sedating Rare depressed times---not anhedonic  Current Outpatient Medications on File Prior to Visit  Medication Sig Dispense Refill   ALPRAZolam (XANAX) 0.5 MG tablet TAKE 1/2 TO 1 TABLET BY MOUTH TWICE A DAY AS NEEDED FOR ANXIETY 60 tablet 0   Calcium Carb-Cholecalciferol 600-200 MG-UNIT TABS Take 3 tablets by mouth daily.     chlorhexidine (PERIDEX) 0.12 % solution SWISH AND SPIT WITH ONE CAPFUL TWICE A DAY AS DIRECTED. 473 mL 5   hydrochlorothiazide (HYDRODIURIL) 25 MG tablet TAKE ONE TABLET BY MOUTH DAILY 90 tablet 3   ibuprofen (ADVIL) 800 MG tablet TAKE 1 TABLET BY MOUTH TWICE A DAY AS NEEDED 100 tablet 0   latanoprost (XALATAN) 0.005 % ophthalmic solution SMARTSIG:In Eye(s)     losartan (COZAAR) 100 MG tablet Take 1 tablet (100 mg total) by mouth daily. 90 tablet 3   Multiple Vitamins-Minerals (HAIR  SKIN AND NAILS FORMULA PO) Take by mouth.     polyethylene glycol (MIRALAX / GLYCOLAX) 17 g packet Take 17 g by mouth daily.     psyllium (METAMUCIL) 58.6 % powder Take 1 packet by mouth daily.     No current facility-administered medications on file prior to visit.    No Known Allergies  Past Medical History:  Diagnosis Date   Allergic rhinitis due to pollen    Anxiety    Depression    GERD (gastroesophageal reflux disease)     Past Surgical History:  Procedure Laterality Date   ABDOMINAL HYSTERECTOMY  08/1998   BSO fibroids   CHOLECYSTECTOMY  8/12   COLON SURGERY     HERNIA REPAIR  11/2003   RIH (ingram)    Family History  Problem Relation Age of Onset   Heart disease Mother    Stroke Father    Heart disease Brother 71       recent MI   Colon polyps Neg Hx    Stomach cancer Neg Hx     Social History   Socioeconomic History   Marital status: Married    Spouse name: Not on file   Number of children: Not on file   Years of education: Not on file   Highest education level: Not on file  Occupational History   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    Comment: Retired  Tobacco Use   Smoking status: Never    Passive exposure: Past   Smokeless tobacco: Never  Vaping Use   Vaping Use: Never used  Substance and Sexual Activity  Alcohol use: No   Drug use: No   Sexual activity: Not Currently  Other Topics Concern   Not on file  Social History Narrative   No formal advanced directives   Son Balinda Quails should be health care POA   Would accept resuscitation attempts but no prolonged ventilation or tube feedings   Social Determinants of Health   Financial Resource Strain: Not on file  Food Insecurity: Not on file  Transportation Needs: Not on file  Physical Activity: Not on file  Stress: Not on file  Social Connections: Not on file  Intimate Partner Violence: Not on file   Review of Systems Appetite is good Weight is fairly stable (lost with COVID but  gained much of it back) Sleeps okay Wears seat belt Teeth okay---keeps up with dentist No suspicious skin lesions No heartburn or dysphagia Bowels move fine with metamucil---no blood Voids okay--but slow. No incontinence No sig back or joint pains    Objective:   Physical Exam Constitutional:      Appearance: Normal appearance.  HENT:     Mouth/Throat:     Comments: No lesions Eyes:     Conjunctiva/sclera: Conjunctivae normal.     Pupils: Pupils are equal, round, and reactive to light.  Cardiovascular:     Rate and Rhythm: Normal rate and regular rhythm.     Pulses: Normal pulses.     Heart sounds: No murmur heard.    No gallop.  Pulmonary:     Effort: Pulmonary effort is normal.     Breath sounds: Normal breath sounds. No wheezing or rales.  Abdominal:     Palpations: Abdomen is soft.     Tenderness: There is no abdominal tenderness.  Musculoskeletal:     Cervical back: Neck supple.     Right lower leg: No edema.     Left lower leg: No edema.  Lymphadenopathy:     Cervical: No cervical adenopathy.  Skin:    Findings: No lesion or rash.  Neurological:     General: No focal deficit present.     Mental Status: She is alert and oriented to person, place, and time.     Comments: Mini-cog okay---clock good, recall 2/3  Psychiatric:        Mood and Affect: Mood normal.        Behavior: Behavior normal.            Assessment & Plan:

## 2022-10-07 NOTE — Assessment & Plan Note (Signed)
I have personally reviewed the Medicare Annual Wellness questionnaire and have noted 1. The patient's medical and social history 2. Their use of alcohol, tobacco or illicit drugs 3. Their current medications and supplements 4. The patient's functional ability including ADL's, fall risks, home safety risks and hearing or visual             impairment. 5. Diet and physical activities 6. Evidence for depression or mood disorders  The patients weight, height, BMI and visual acuity have been recorded in the chart I have made referrals, counseling and provided education to the patient based review of the above and I have provided the pt with a written personalized care plan for preventive services.  I have provided you with a copy of your personalized plan for preventive services. Please take the time to review along with your updated medication list.  Mammogram every 2 years--11/24 due FIT No pap--had hyster Discussed exercise--she stays active Flu vaccine today Consider COVID vaccine later--just had infection

## 2022-10-07 NOTE — Addendum Note (Signed)
Addended by: Pilar Grammes on: 10/07/2022 10:47 AM   Modules accepted: Orders

## 2022-10-11 ENCOUNTER — Other Ambulatory Visit (INDEPENDENT_AMBULATORY_CARE_PROVIDER_SITE_OTHER): Payer: Medicare HMO | Admitting: Radiology

## 2022-10-11 DIAGNOSIS — Z1211 Encounter for screening for malignant neoplasm of colon: Secondary | ICD-10-CM | POA: Diagnosis not present

## 2022-10-11 LAB — FECAL OCCULT BLOOD, IMMUNOCHEMICAL: Fecal Occult Bld: NEGATIVE

## 2022-10-12 ENCOUNTER — Other Ambulatory Visit: Payer: Self-pay | Admitting: Internal Medicine

## 2022-10-12 NOTE — Telephone Encounter (Signed)
Last filled 09-13-22 #60 Last OV 10-07-22 Next OV 10-13-23 [facility]

## 2022-10-15 ENCOUNTER — Other Ambulatory Visit: Payer: Self-pay | Admitting: Internal Medicine

## 2022-11-02 ENCOUNTER — Telehealth: Payer: Self-pay | Admitting: Internal Medicine

## 2022-11-02 MED ORDER — HYDROCHLOROTHIAZIDE 25 MG PO TABS: 25.0000 mg | ORAL_TABLET | Freq: Every day | ORAL | 3 refills | Status: DC

## 2022-11-02 NOTE — Telephone Encounter (Signed)
Rx sent to [facility]

## 2022-11-02 NOTE — Telephone Encounter (Signed)
Patient called in and stated that her hydrochlorothiazide (HYDRODIURIL) 25 MG tablet [facility] not [facility]. [facility] stated that [facility] but hasn't heard anything. Please advise Thank you!  [id number] [place] [place] - [address]

## 2022-11-14 ENCOUNTER — Other Ambulatory Visit: Payer: Self-pay | Admitting: Internal Medicine

## 2022-11-14 NOTE — Telephone Encounter (Signed)
Last filled 10-12-22 #60 Last OV 10-07-22 Next OV 10-13-23 [facility]

## 2022-11-23 ENCOUNTER — Telehealth: Payer: Self-pay | Admitting: Internal Medicine

## 2022-11-23 MED ORDER — ESTRADIOL 0.5 MG PO TABS: 0.5000 mg | ORAL_TABLET | Freq: Every day | ORAL | 3 refills | Status: DC

## 2022-11-23 NOTE — Addendum Note (Signed)
Addended by: Pilar Grammes on: 11/23/2022 02:32 PM   Modules accepted: Orders

## 2022-11-23 NOTE — Telephone Encounter (Signed)
Patient called in and would like to have her results from her physical mailed out to her. She would also like to go back on Estradiol. She stated she is having hot flashes constantly. She would also like to go on Ozempic if possible. Thank you!

## 2022-11-23 NOTE — Telephone Encounter (Signed)
Spoke to pt. Estradiol sent to [facility]. Explained about Ozempic. Printed labs to be mailed out.

## 2022-12-14 ENCOUNTER — Other Ambulatory Visit: Payer: Self-pay | Admitting: Internal Medicine

## 2022-12-20 NOTE — Telephone Encounter (Signed)
Last filled 11-14-22 #60 Last OV 10-07-22 Next OV 10-13-23 [facility]

## 2022-12-20 NOTE — Telephone Encounter (Signed)
Pt called in wants to know status of RX refill . Stated she in out of medication ALPRAZolam (XANAX) 0.5 MG table

## 2022-12-22 ENCOUNTER — Ambulatory Visit: Payer: Medicare HMO | Admitting: Internal Medicine

## 2023-01-18 DIAGNOSIS — H43313 Vitreous membranes and strands, bilateral: Secondary | ICD-10-CM | POA: Diagnosis not present

## 2023-01-18 DIAGNOSIS — H40033 Anatomical narrow angle, bilateral: Secondary | ICD-10-CM | POA: Diagnosis not present

## 2023-01-23 ENCOUNTER — Other Ambulatory Visit: Payer: Self-pay | Admitting: Internal Medicine

## 2023-01-23 NOTE — Telephone Encounter (Signed)
Last filled 12-20-22 #60 Last OV 10-07-22 Next OV 10-13-23 [facility]

## 2023-02-06 ENCOUNTER — Other Ambulatory Visit: Payer: Self-pay | Admitting: Internal Medicine

## 2023-02-22 ENCOUNTER — Other Ambulatory Visit: Payer: Self-pay | Admitting: Internal Medicine

## 2023-02-22 NOTE — Telephone Encounter (Signed)
Last filled 01-23-23 #60 Last OV 10-07-22 Next OV 10-13-23 [facility]

## 2023-03-24 ENCOUNTER — Other Ambulatory Visit: Payer: Self-pay | Admitting: Internal Medicine

## 2023-03-24 NOTE — Telephone Encounter (Signed)
Last filled 02-22-23 #60 Last OV 10-07-22 Next OV 10-13-23 [facility]

## 2023-03-24 NOTE — Telephone Encounter (Signed)
Prescription Request  03/24/2023  LOV: 10/07/2022  What is the name of the medication or equipment? ALPRAZolam (XANAX) 0.5 MG tablet   Have you contacted your pharmacy to request a refill? Yes   Which pharmacy would you like this sent to?    [id number], [place] - [address] [place] [address] Phone: [phone] Fax: [phone]   Patient notified that their request is being sent to the clinical staff for review and that they should receive a response within 2 business days.   Please advise at Mobile [phone] (mobile)

## 2023-03-27 MED ORDER — ALPRAZOLAM 0.5 MG PO TABS: ORAL_TABLET | ORAL | 0 refills | Status: DC

## 2023-03-30 ENCOUNTER — Other Ambulatory Visit: Payer: Self-pay | Admitting: Internal Medicine

## 2023-04-17 ENCOUNTER — Telehealth: Payer: Self-pay

## 2023-04-17 NOTE — Patient Outreach (Signed)
  Care Coordination   Initial Visit Note   04/17/2023 Name: ANITRIA ANDON MRN: [id number] DOB: 09-28-1956  VEOLA CAFARO is a 67 y.o. year old female who sees Karie Schwalbe, MD for primary care. I spoke with  Georges Mouse by phone today.  What matters to the patients health and wellness today?  Patient denies having nursing or community resource needs.  Patient discussed having upcoming colonoscopy scheduled.     Goals Addressed             This Visit's Progress    COMPLETED: care coordination activities - no further follow up needed.       Interventions Today    Flowsheet Row Most Recent Value  General Interventions   General Interventions Discussed/Reviewed General Interventions Discussed  [Care coordination  services discussed. SDOH survey completed. AWV discussed and patient advised to schedule with primary. Discussed vaccines. Advised to have family member/ friend drive/ accompany for colonoscopy. Call pcp for care coordination if needed]              SDOH assessments and interventions completed:  Yes  SDOH Interventions Today    Flowsheet Row Most Recent Value  SDOH Interventions   Food Insecurity Interventions Intervention Not Indicated  Housing Interventions Intervention Not Indicated  Transportation Interventions Intervention Not Indicated        Care Coordination Interventions:  Yes, provided   Follow up plan: No further intervention required.   Encounter Outcome:  Pt. Visit Completed   George Ina RN,BSN,CCM [facility] Care Coordination [phone] direct line

## 2023-04-25 ENCOUNTER — Ambulatory Visit (INDEPENDENT_AMBULATORY_CARE_PROVIDER_SITE_OTHER): Payer: Medicare HMO | Admitting: Internal Medicine

## 2023-04-25 ENCOUNTER — Encounter: Payer: Self-pay | Admitting: Internal Medicine

## 2023-04-25 VITALS — BP 130/84 | HR 93 | Temp 97.6°F | Ht 65.5 in | Wt 189.0 lb

## 2023-04-25 DIAGNOSIS — H73011 Bullous myringitis, right ear: Secondary | ICD-10-CM | POA: Insufficient documentation

## 2023-04-25 MED ORDER — AMOXICILLIN-POT CLAVULANATE 875-125 MG PO TABS: 1.0000 | ORAL_TABLET | Freq: Two times a day (BID) | ORAL | 0 refills | Status: DC

## 2023-04-25 MED ORDER — BENZONATATE 200 MG PO CAPS: 200.0000 mg | ORAL_CAPSULE | Freq: Three times a day (TID) | ORAL | 0 refills | Status: DC | PRN

## 2023-04-25 NOTE — Progress Notes (Signed)
Subjective:    Patient ID: [name], female    DOB: 08/28/1956, 67 y.o.   MRN: [id number]  HPI Here due to persistent cough  Exposed to ill 67 year old 2 weeks ago visiting Symptoms came on a couple of days later Didn't check COVID  Started with runny nose--now with really bad cough Bad sore throat at first--that is better Feels tired--sleeping more  Lots of phlegm No fever, chills or sweats No real SOB--but hears some wheezing (actually can keep her up) No headache Ears bothered her--but better. Hearing is off Gets left mid back pain with cough--using ibuprofen for this  Current Outpatient Medications on File Prior to Visit  Medication Sig Dispense Refill   ALPRAZolam (XANAX) 0.5 MG tablet TAKE 1/2 TO 1 TABLET BY MOUTH TWICE A DAY AS NEEDED FOR ANXIETY 60 tablet 0   Calcium Carb-Cholecalciferol 600-200 MG-UNIT TABS Take 3 tablets by mouth daily.     chlorhexidine (PERIDEX) 0.12 % solution SWISH AND SPIT WITH ONE CAPFUL TWICE A DAY AS DIRECTED. 473 mL 5   estradiol (ESTRACE) 0.5 MG tablet Take 1 tablet (0.5 mg total) by mouth daily. 90 tablet 3   hydrochlorothiazide (HYDRODIURIL) 25 MG tablet Take 1 tablet (25 mg total) by mouth daily. 90 tablet 3   ibuprofen (ADVIL) 800 MG tablet TAKE 1 TABLET BY MOUTH TWICE A DAY AS NEEDED 100 tablet 0   latanoprost (XALATAN) 0.005 % ophthalmic solution SMARTSIG:In Eye(s)     losartan (COZAAR) 100 MG tablet Take 1 tablet (100 mg total) by mouth daily. 90 tablet 3   Multiple Vitamins-Minerals (HAIR SKIN AND NAILS FORMULA PO) Take by mouth.     polyethylene glycol (MIRALAX / GLYCOLAX) 17 g packet Take 17 g by mouth daily.     psyllium (METAMUCIL) 58.6 % powder Take 1 packet by mouth daily.     No current facility-administered medications on file prior to visit.    No Known Allergies  Past Medical History:  Diagnosis Date   Allergic rhinitis due to pollen    Anxiety    Depression    GERD (gastroesophageal reflux disease)     Past  Surgical History:  Procedure Laterality Date   ABDOMINAL HYSTERECTOMY  08/1998   BSO fibroids   CHOLECYSTECTOMY  8/12   COLON SURGERY     HERNIA REPAIR  11/2003   RIH (ingram)    Family History  Problem Relation Age of Onset   Heart disease Mother    Stroke Father    Heart disease Brother 76       recent MI   Colon polyps Neg Hx    Stomach cancer Neg Hx     Social History   Socioeconomic History   Marital status: Married    Spouse name: Not on file   Number of children: Not on file   Years of education: Not on file   Highest education level: Not on file  Occupational History   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    Comment: Retired  Tobacco Use   Smoking status: Never    Passive exposure: Past   Smokeless tobacco: Never  Vaping Use   Vaping Use: Never used  Substance and Sexual Activity   Alcohol use: No   Drug use: No   Sexual activity: Not Currently  Other Topics Concern   Not on file  Social History Narrative   No formal advanced directives   Son Dema Severin should be health care POA   Would accept resuscitation  attempts but no prolonged ventilation or tube feedings   Social Determinants of Health   Financial Resource Strain: Not on file  Food Insecurity: No Food Insecurity (04/17/2023)   Hunger Vital Sign    Worried About Running Out of Food in the Last Year: Never true    Ran Out of Food in the Last Year: Never true  Transportation Needs: No Transportation Needs (04/17/2023)   PRAPARE - Administrator, Civil Service (Medical): No    Lack of Transportation (Non-Medical): No  Physical Activity: Not on file  Stress: Not on file  Social Connections: Not on file  Intimate Partner Violence: Not on file   Review of Systems No N/V Eating okay    Objective:   Physical Exam Constitutional:      Appearance: Normal appearance.     Comments: Coarse cough  HENT:     Head:     Comments: No sinus tenderness    Ears:     Comments: Left TM  slightly retracted Right TM with upper outer bullous changes and some erythema    Mouth/Throat:     Pharynx: No oropharyngeal exudate or posterior oropharyngeal erythema.  Neck:     Comments: Small non tender anterior cervical nodes Pulmonary:     Effort: Pulmonary effort is normal.     Breath sounds: Normal breath sounds. No wheezing or rales.  Musculoskeletal:     Cervical back: Neck supple.  Neurological:     Mental Status: She is alert.            Assessment & Plan:

## 2023-04-25 NOTE — Assessment & Plan Note (Signed)
With general respiratory infection and cough Augmentin 875 bid x 7 days Benzonatate prn--can also use DM over the counter cough suppressants Continue analgesics

## 2023-05-02 ENCOUNTER — Telehealth: Payer: Self-pay | Admitting: Internal Medicine

## 2023-05-02 MED ORDER — FLUCONAZOLE 150 MG PO TABS: 150.0000 mg | ORAL_TABLET | ORAL | 1 refills | Status: DC

## 2023-05-02 NOTE — Telephone Encounter (Signed)
Noted  

## 2023-05-02 NOTE — Telephone Encounter (Signed)
Patient has multiple pharmacies listed.  Please confirm what pharmacy she would like medication sent to.

## 2023-05-02 NOTE — Addendum Note (Signed)
Addended by: Tillman Abide I on: 05/02/2023 01:31 PM   Modules accepted: Orders

## 2023-05-02 NOTE — Telephone Encounter (Signed)
Patient called in and stated that she was prescribed an antibiotic and she now has a yeast infection. She was wanting to know if something could be sent in for her. Thank you!

## 2023-05-02 NOTE — Telephone Encounter (Signed)
Patient called in returning call. Informed patient that RX was sent in for her.

## 2023-05-02 NOTE — Telephone Encounter (Signed)
Patient would like medication to be sent to  [id number] - [place], [place] - [address]

## 2023-05-02 NOTE — Telephone Encounter (Signed)
Left a message on voicemail for patient to call back. 

## 2023-05-05 ENCOUNTER — Other Ambulatory Visit: Payer: Self-pay | Admitting: Internal Medicine

## 2023-05-05 DIAGNOSIS — Z1231 Encounter for screening mammogram for malignant neoplasm of breast: Secondary | ICD-10-CM

## 2023-05-23 ENCOUNTER — Other Ambulatory Visit: Payer: Self-pay | Admitting: Internal Medicine

## 2023-05-23 ENCOUNTER — Telehealth: Payer: Self-pay | Admitting: Internal Medicine

## 2023-05-23 DIAGNOSIS — N8110 Cystocele, unspecified: Secondary | ICD-10-CM

## 2023-05-23 NOTE — Telephone Encounter (Signed)
   Reason for Referral Request: Surgery, prolapse   Has patient been seen PCP for this complaint? yes  No,  please schedule patient for appointment for complaint.  Patient scheduled on:   Yes, please find out following information.  Referral for which specialty: Gynecology   Preferred office/provider: [facility] care Ginette Otto, fax# [phone]

## 2023-05-23 NOTE — Telephone Encounter (Signed)
Last filled 03-27-23 #60 Last OV 04-25-23 Next OV 10-13-23 [facility]

## 2023-05-24 ENCOUNTER — Other Ambulatory Visit: Payer: Self-pay | Admitting: Internal Medicine

## 2023-06-01 DIAGNOSIS — B078 Other viral warts: Secondary | ICD-10-CM | POA: Diagnosis not present

## 2023-06-01 DIAGNOSIS — B86 Scabies: Secondary | ICD-10-CM | POA: Diagnosis not present

## 2023-06-06 ENCOUNTER — Ambulatory Visit
Admission: RE | Admit: 2023-06-06 | Discharge: 2023-06-06 | Disposition: A | Discharge status: Home / Self Care | Payer: Medicare HMO | Source: Ambulatory Visit | Attending: Internal Medicine | Admitting: Internal Medicine

## 2023-06-06 DIAGNOSIS — Z1231 Encounter for screening mammogram for malignant neoplasm of breast: Secondary | ICD-10-CM

## 2023-06-09 ENCOUNTER — Other Ambulatory Visit: Payer: Self-pay | Admitting: Internal Medicine

## 2023-06-09 DIAGNOSIS — R928 Other abnormal and inconclusive findings on diagnostic imaging of breast: Secondary | ICD-10-CM

## 2023-06-12 ENCOUNTER — Ambulatory Visit
Admission: RE | Admit: 2023-06-12 | Discharge: 2023-06-12 | Disposition: A | Discharge status: Home / Self Care | Payer: Medicare HMO | Source: Ambulatory Visit | Attending: Internal Medicine | Admitting: Internal Medicine

## 2023-06-12 DIAGNOSIS — R928 Other abnormal and inconclusive findings on diagnostic imaging of breast: Secondary | ICD-10-CM

## 2023-06-12 DIAGNOSIS — N6325 Unspecified lump in the left breast, overlapping quadrants: Secondary | ICD-10-CM | POA: Diagnosis not present

## 2023-06-15 ENCOUNTER — Other Ambulatory Visit: Payer: Self-pay | Admitting: Internal Medicine

## 2023-06-15 DIAGNOSIS — N6320 Unspecified lump in the left breast, unspecified quadrant: Secondary | ICD-10-CM

## 2023-06-23 ENCOUNTER — Other Ambulatory Visit: Payer: Self-pay | Admitting: Internal Medicine

## 2023-06-23 NOTE — Telephone Encounter (Signed)
Prescription Request  06/23/2023  LOV: 04/25/2023  What is the name of the medication or equipment? ALPRAZolam (XANAX) 0.5 MG tablet   Have you contacted your pharmacy to request a refill? No   Which pharmacy would you like this sent to?    [id number], [place] - [address] [place] [address] Phone: [phone] Fax: [phone]     Patient notified that their request is being sent to the clinical staff for review and that they should receive a response within 2 business days.   Please advise at Mobile [phone] (mobile)

## 2023-06-23 NOTE — Telephone Encounter (Signed)
Pt called stating she is having issues finding a surgeon for her prolapse surgery. Pt is asking can Dr. Alphonsus Sias submit a referral to [facility] in [place]? Surgeon's name is Dr. Jennell Corner. Call back # [phone].

## 2023-06-24 ENCOUNTER — Other Ambulatory Visit: Payer: Self-pay | Admitting: Internal Medicine

## 2023-06-25 MED ORDER — ALPRAZOLAM 0.5 MG PO TABS: ORAL_TABLET | ORAL | 0 refills | Status: DC

## 2023-06-25 NOTE — Addendum Note (Signed)
Addended by: Tillman Abide I on: 06/25/2023 10:54 AM   Modules accepted: Orders

## 2023-07-09 ENCOUNTER — Other Ambulatory Visit: Payer: Self-pay | Admitting: Internal Medicine

## 2023-07-20 DIAGNOSIS — H5203 Hypermetropia, bilateral: Secondary | ICD-10-CM | POA: Diagnosis not present

## 2023-07-21 ENCOUNTER — Other Ambulatory Visit: Payer: Self-pay | Admitting: Internal Medicine

## 2023-07-21 NOTE — Telephone Encounter (Signed)
Last filled 06-25-23 #60 Last OV 04-25-23 Next OV 10-13-23 [facility]

## 2023-07-25 ENCOUNTER — Telehealth: Payer: Self-pay

## 2023-07-25 NOTE — Telephone Encounter (Signed)
Received a fax request for chlorhexidine rinse. Last written in 04/2021. Will forward to Dr Alphonsus Sias to approve.

## 2023-07-26 MED ORDER — CHLORHEXIDINE GLUCONATE 0.12 % MT SOLN: 5.0000 mL | Freq: Two times a day (BID) | OROMUCOSAL | 5 refills | Status: DC

## 2023-07-26 NOTE — Telephone Encounter (Signed)
Rx sent 

## 2023-08-01 ENCOUNTER — Ambulatory Visit (INDEPENDENT_AMBULATORY_CARE_PROVIDER_SITE_OTHER): Payer: Medicare HMO | Admitting: Internal Medicine

## 2023-08-01 ENCOUNTER — Encounter: Payer: Self-pay | Admitting: Internal Medicine

## 2023-08-01 VITALS — BP 130/70 | HR 67 | Temp 97.0°F | Ht 65.5 in | Wt 195.0 lb

## 2023-08-01 DIAGNOSIS — R21 Rash and other nonspecific skin eruption: Secondary | ICD-10-CM | POA: Diagnosis not present

## 2023-08-01 MED ORDER — NYSTATIN 100000 UNIT/GM EX CREA: 1.0000 | TOPICAL_CREAM | Freq: Two times a day (BID) | CUTANEOUS | 2 refills | Status: DC

## 2023-08-01 NOTE — Progress Notes (Signed)
Subjective:    Patient ID: [name], female    DOB: March 15, 1956, 66 y.o.   MRN: [id number]  HPI Here due to rash, the rash in right groin  Started with rash from chaffing--using baby powder to keep it dry Kept it covered with gauze Then chaffing seemed better but got pink raw area that worried her--now looks like a mole Some pain there No itching No discharge  Current Outpatient Medications on File Prior to Visit  Medication Sig Dispense Refill   ALPRAZolam (XANAX) 0.5 MG tablet TAKE 1/2 TO 1 TABLET BY MOUTH TWICE A DAY AS NEEDED FOR ANXIETY 60 tablet 0   Calcium Carb-Cholecalciferol 600-200 MG-UNIT TABS Take 3 tablets by mouth daily.     chlorhexidine (PERIDEX) 0.12 % solution Use as directed 5 mLs in the mouth or throat 2 (two) times daily. 473 mL 5   estradiol (ESTRACE) 0.5 MG tablet Take 1 tablet (0.5 mg total) by mouth daily. 90 tablet 3   hydrochlorothiazide (HYDRODIURIL) 25 MG tablet Take 1 tablet (25 mg total) by mouth daily. 90 tablet 3   ibuprofen (ADVIL) 800 MG tablet TAKE 1 TABLET BY MOUTH TWICE A DAY AS NEEDED 100 tablet 0   latanoprost (XALATAN) 0.005 % ophthalmic solution SMARTSIG:In Eye(s)     losartan (COZAAR) 100 MG tablet Take 1 tablet (100 mg total) by mouth daily. 90 tablet 3   polyethylene glycol (MIRALAX / GLYCOLAX) 17 g packet Take 17 g by mouth daily.     psyllium (METAMUCIL) 58.6 % powder Take 1 packet by mouth daily.     No current facility-administered medications on file prior to visit.    No Known Allergies  Past Medical History:  Diagnosis Date   Allergic rhinitis due to pollen    Anxiety    Depression    GERD (gastroesophageal reflux disease)     Past Surgical History:  Procedure Laterality Date   ABDOMINAL HYSTERECTOMY  08/1998   BSO fibroids   CHOLECYSTECTOMY  8/12   COLON SURGERY     HERNIA REPAIR  11/2003   RIH (ingram)    Family History  Problem Relation Age of Onset   Heart disease Mother    Stroke Father    Heart  disease Brother 47       recent MI   Colon polyps Neg Hx    Stomach cancer Neg Hx     Social History   Socioeconomic History   Marital status: Married    Spouse name: Not on file   Number of children: Not on file   Years of education: Not on file   Highest education level: Not on file  Occupational History   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    Comment: Retired  Tobacco Use   Smoking status: Never    Passive exposure: Past   Smokeless tobacco: Never  Vaping Use   Vaping status: Never Used  Substance and Sexual Activity   Alcohol use: No   Drug use: No   Sexual activity: Not Currently  Other Topics Concern   Not on file  Social History Narrative   No formal advanced directives   Son Dema Severin should be health care POA   Would accept resuscitation attempts but no prolonged ventilation or tube feedings   Social Determinants of Health   Financial Resource Strain: Not on file  Food Insecurity: No Food Insecurity (04/17/2023)   Hunger Vital Sign    Worried About Running Out of Food in the Last Year: Never  true    Ran Out of Food in the Last Year: Never true  Transportation Needs: No Transportation Needs (04/17/2023)   PRAPARE - Administrator, Civil Service (Medical): No    Lack of Transportation (Non-Medical): No  Physical Activity: Not on file  Stress: Not on file  Social Connections: Not on file  Intimate Partner Violence: Not on file   Review of Systems No fever Under a lot of stress--husband with multiple infections and doctors    Objective:   Physical Exam Constitutional:      Appearance: Normal appearance.  Skin:    Comments: Mild redness in right inguinal area Slight red area along the medial border Above the rash--she has a seb keratosis (pale without irritation)  Neurological:     Mental Status: She is alert.            Assessment & Plan:

## 2023-08-01 NOTE — Assessment & Plan Note (Signed)
Reassured that the lesion (seb keratosis) is benign--no action needed Still with some rash--yeast since antibioitic Rx Will rx nystatin cream for prn use

## 2023-08-22 ENCOUNTER — Other Ambulatory Visit: Payer: Self-pay | Admitting: Internal Medicine

## 2023-08-22 NOTE — Telephone Encounter (Signed)
Patient called in to check on the status of this medication being sent in

## 2023-08-23 LAB — FECAL OCCULT BLOOD, IMMUNOCHEMICAL: IFOBT: NEGATIVE

## 2023-08-24 ENCOUNTER — Ambulatory Visit: Payer: Medicare HMO | Admitting: Dermatology

## 2023-08-24 VITALS — BP 138/84 | HR 79

## 2023-08-24 DIAGNOSIS — L720 Epidermal cyst: Secondary | ICD-10-CM

## 2023-08-24 DIAGNOSIS — Z1283 Encounter for screening for malignant neoplasm of skin: Secondary | ICD-10-CM | POA: Diagnosis not present

## 2023-08-24 DIAGNOSIS — L918 Other hypertrophic disorders of the skin: Secondary | ICD-10-CM | POA: Diagnosis not present

## 2023-08-24 DIAGNOSIS — Z79899 Other long term (current) drug therapy: Secondary | ICD-10-CM

## 2023-08-24 DIAGNOSIS — D1801 Hemangioma of skin and subcutaneous tissue: Secondary | ICD-10-CM | POA: Diagnosis not present

## 2023-08-24 DIAGNOSIS — Z7189 Other specified counseling: Secondary | ICD-10-CM

## 2023-08-24 DIAGNOSIS — L578 Other skin changes due to chronic exposure to nonionizing radiation: Secondary | ICD-10-CM | POA: Diagnosis not present

## 2023-08-24 DIAGNOSIS — L729 Follicular cyst of the skin and subcutaneous tissue, unspecified: Secondary | ICD-10-CM

## 2023-08-24 DIAGNOSIS — W908XXA Exposure to other nonionizing radiation, initial encounter: Secondary | ICD-10-CM

## 2023-08-24 DIAGNOSIS — L814 Other melanin hyperpigmentation: Secondary | ICD-10-CM | POA: Diagnosis not present

## 2023-08-24 DIAGNOSIS — Z0100 Encounter for examination of eyes and vision without abnormal findings: Secondary | ICD-10-CM | POA: Diagnosis not present

## 2023-08-24 DIAGNOSIS — L820 Inflamed seborrheic keratosis: Secondary | ICD-10-CM

## 2023-08-24 DIAGNOSIS — L821 Other seborrheic keratosis: Secondary | ICD-10-CM

## 2023-08-24 MED ORDER — TRETINOIN 0.05 % EX CREA: TOPICAL_CREAM | CUTANEOUS | 5 refills | Status: DC

## 2023-08-24 NOTE — Telephone Encounter (Signed)
Last filled 07-21-23 #60 Last OV 08-01-23 Next OV 10-13-23 [facility] [place]

## 2023-08-24 NOTE — Patient Instructions (Signed)

## 2023-08-24 NOTE — Progress Notes (Signed)
Follow-Up Visit   Subjective  [name] is a 67 y.o. female who presents for the following: Skin Cancer Screening and Full Body Skin Exam  The patient presents for Total-Body Skin Exam (TBSE) for skin cancer screening and mole check. The patient has spots, moles and lesions to be evaluated, some may be new or changing and the patient may have concern these could be cancer.  The following portions of the chart were reviewed this encounter and updated as appropriate: medications, allergies, medical history  Review of Systems:  No other skin or systemic complaints except as noted in HPI or Assessment and Plan.  Objective  Well appearing patient in no apparent distress; mood and affect are within normal limits.  A full examination was performed including scalp, head, eyes, ears, nose, lips, neck, chest, axillae, abdomen, back, buttocks, bilateral upper extremities, bilateral lower extremities, hands, feet, fingers, toes, fingernails, and toenails. All findings within normal limits unless otherwise noted below.   Relevant physical exam findings are noted in the Assessment and Plan.  L shoulder x 7 Erythematous stuck-on, waxy papule or plaque  R neck x 1, R infra mammary x 1 (2) Fleshy, skin-colored pedunculated papules.      Assessment & Plan   SKIN CANCER SCREENING PERFORMED TODAY.  ACTINIC DAMAGE - Chronic condition, secondary to cumulative UV/sun exposure - diffuse scaly erythematous macules with underlying dyspigmentation - Recommend daily broad spectrum sunscreen SPF 30+ to sun-exposed areas, reapply every 2 hours as needed.  - Staying in the shade or wearing long sleeves, sun glasses (UVA+UVB protection) and wide brim hats (4-inch brim around the entire circumference of the hat) are also recommended for sun protection.  - Call for new or changing lesions.  LENTIGINES, SEBORRHEIC KERATOSES, HEMANGIOMAS - Benign normal skin lesions - Benign-appearing - Call for any  changes  MELANOCYTIC NEVI - Tan-brown and/or pink-flesh-colored symmetric macules and papules - Benign appearing on exam today - Observation - Call clinic for new or changing moles - Recommend daily use of broad spectrum spf 30+ sunscreen to sun-exposed areas.   Milia - tiny firm white papules - type of cyst - benign - sometimes these will clear with nightly OTC adapalene/Differin 0.1% gel or retinol. - may be extracted if symptomatic - observe - Start Tretinoin 0.05% cream spot treat aa's QHS.  Inflamed seborrheic keratosis L shoulder x 7  Symptomatic, irritating, patient would like treated.   Destruction of lesion - L shoulder x 7 Complexity: simple   Destruction method: cryotherapy   Informed consent: discussed and consent obtained   Timeout:  patient name, date of birth, surgical site, and procedure verified Lesion destroyed using liquid nitrogen: Yes   Region frozen until ice ball extended beyond lesion: Yes   Outcome: patient tolerated procedure well with no complications   Post-procedure details: wound care instructions given    Skin tag (2) R neck x 1, R infra mammary x 1  Symptomatic, irritating, patient would like treated.   Destruction of lesion - R neck x 1, R infra mammary x 1 (2) Complexity: simple   Destruction method: cryotherapy   Informed consent: discussed and consent obtained   Timeout:  patient name, date of birth, surgical site, and procedure verified Lesion destroyed using liquid nitrogen: Yes   Region frozen until ice ball extended beyond lesion: Yes   Outcome: patient tolerated procedure well with no complications   Post-procedure details: wound care instructions given    Acrochordons (Skin Tags) - Fleshy, skin-colored  pedunculated papules - Benign appearing.  - Observe. - If desired, they can be removed with an in office procedure that is not covered by insurance. - Please call the clinic if you notice any new or changing  lesions.  Return in about 1 year (around 08/23/2024) for TBSE.  Maylene Roes, CMA, am acting as scribe for Armida Sans, MD .   Documentation: I have reviewed the above documentation for accuracy and completeness, and I agree with the above.  Armida Sans, MD

## 2023-08-26 ENCOUNTER — Encounter: Payer: Self-pay | Admitting: Dermatology

## 2023-08-29 ENCOUNTER — Other Ambulatory Visit: Payer: Self-pay | Admitting: Internal Medicine

## 2023-09-22 ENCOUNTER — Other Ambulatory Visit: Payer: Self-pay | Admitting: Internal Medicine

## 2023-10-11 ENCOUNTER — Encounter: Payer: Medicare HMO | Admitting: Internal Medicine

## 2023-10-13 ENCOUNTER — Encounter: Payer: Self-pay | Admitting: Internal Medicine

## 2023-10-13 ENCOUNTER — Ambulatory Visit (INDEPENDENT_AMBULATORY_CARE_PROVIDER_SITE_OTHER): Payer: Medicare HMO | Admitting: Internal Medicine

## 2023-10-13 VITALS — BP 122/82 | HR 68 | Temp 98.2°F | Ht 64.25 in | Wt 189.0 lb

## 2023-10-13 DIAGNOSIS — N951 Menopausal and female climacteric states: Secondary | ICD-10-CM

## 2023-10-13 DIAGNOSIS — Z0000 Encounter for general adult medical examination without abnormal findings: Secondary | ICD-10-CM

## 2023-10-13 DIAGNOSIS — R079 Chest pain, unspecified: Secondary | ICD-10-CM | POA: Insufficient documentation

## 2023-10-13 DIAGNOSIS — Z1211 Encounter for screening for malignant neoplasm of colon: Secondary | ICD-10-CM

## 2023-10-13 DIAGNOSIS — F39 Unspecified mood [affective] disorder: Secondary | ICD-10-CM

## 2023-10-13 DIAGNOSIS — I10 Essential (primary) hypertension: Secondary | ICD-10-CM | POA: Diagnosis not present

## 2023-10-13 DIAGNOSIS — Z1159 Encounter for screening for other viral diseases: Secondary | ICD-10-CM | POA: Diagnosis not present

## 2023-10-13 DIAGNOSIS — Z0001 Encounter for general adult medical examination with abnormal findings: Secondary | ICD-10-CM | POA: Diagnosis not present

## 2023-10-13 LAB — CBC
HCT: 43.9 % (ref 36.0–46.0)
Hemoglobin: 14.6 g/dL (ref 12.0–15.0)
MCHC: 33.2 g/dL (ref 30.0–36.0)
MCV: 98.7 fL (ref 78.0–100.0)
Platelets: 249 10*3/uL (ref 150.0–400.0)
RBC: 4.44 Mil/uL (ref 3.87–5.11)
RDW: 12.4 % (ref 11.5–15.5)
WBC: 5.5 10*3/uL (ref 4.0–10.5)

## 2023-10-13 LAB — LIPID PANEL
Cholesterol: 145 mg/dL (ref 0–200)
HDL: 50.3 mg/dL (ref >39.00)
LDL Cholesterol: 66 mg/dL (ref 0–99)
NonHDL: 94.42
Total CHOL/HDL Ratio: 3
Triglycerides: 142 mg/dL (ref 0.0–149.0)
VLDL: 28.4 mg/dL (ref 0.0–40.0)

## 2023-10-13 LAB — COMPREHENSIVE METABOLIC PANEL
ALT: 17 U/L (ref 0–35)
AST: 20 U/L (ref 0–37)
Albumin: 4 g/dL (ref 3.5–5.2)
Alkaline Phosphatase: 85 U/L (ref 39–117)
BUN: 11 mg/dL (ref 6–23)
CO2: 31 meq/L (ref 19–32)
Calcium: 9.4 mg/dL (ref 8.4–10.5)
Chloride: 103 meq/L (ref 96–112)
Creatinine, Ser: 0.7 mg/dL (ref 0.40–1.20)
GFR: 89.26 mL/min (ref >60.00)
Glucose, Bld: 95 mg/dL (ref 70–99)
Potassium: 3.7 meq/L (ref 3.5–5.1)
Sodium: 140 meq/L (ref 135–145)
Total Bilirubin: 0.6 mg/dL (ref 0.2–1.2)
Total Protein: 6.6 g/dL (ref 6.0–8.3)

## 2023-10-13 LAB — TSH: TSH: 1.28 u[IU]/mL (ref 0.35–5.50)

## 2023-10-13 NOTE — Assessment & Plan Note (Signed)
Mostly with panic--but strong FH Probably needs CT calcium score--will set up with cardiology

## 2023-10-13 NOTE — Progress Notes (Signed)
 Hearing Screening - Comments:: Passed whisper test Vision Screening - Comments:: August 2024

## 2023-10-13 NOTE — Assessment & Plan Note (Signed)
Does okay with the estrogen

## 2023-10-13 NOTE — Progress Notes (Signed)
Subjective:    Patient ID: [name], female    DOB: [birth year], 67 y.o.   MRN: [id number]  HPI Here for Medicare wellness visit and follow up of chronic health conditions Reviewed advanced directives Reviewed other doctors---Dr Richardson--ophthal, Dr Sylvie Farrier, Dr Alvina Chou, [facility] No hospitalizations or surgery in the past year Vision is okay Hearing is fine No alcohol or tobacco No specific exercise---but stays active. Wants to start walking dog No falls Independent with instrumental ADLs Some memory problems--just forgets what she was going to say. No functional issues (thinks she just gets overwhelmed)  Recent stress Went to [place] without husband--came back to him in bed covered with blood Evaluated in ER but sent home (bled from skin excision site) Ongoing stress with him Still gets occasional panic--very random---uses the xanax then Will isolate and get fan in her face, etc (as well) Will have anxiety flare at times as well--without panic. Will uses 1/4 xanax at times then Not really depressed  Has mole on leg she wants checked  Worried about her heart due to strong FH Only gets chest pain with the panic No palpitations No dizziness or syncope  Back on estrogen again--lower dose Didn't tolerate lowering it--couldn't sleep  Plans surgery for rectocele Holding off due to need to care for husband  Current Outpatient Medications on File Prior to Visit  Medication Sig Dispense Refill   ALPRAZolam (XANAX) 0.5 MG tablet TAKE 1/2 TO 1 TABLET BY MOUTH TWICE A DAY AS NEEDED FOR ANXIETY 60 tablet 0   Calcium Carb-Cholecalciferol 600-200 MG-UNIT TABS Take 3 tablets by mouth daily.     chlorhexidine (PERIDEX) 0.12 % solution Use as directed 5 mLs in the mouth or throat 2 (two) times daily. 473 mL 5   estradiol (ESTRACE) 0.5 MG tablet Take 1 tablet (0.5 mg total) by mouth daily. 90 tablet 3   hydrochlorothiazide (HYDRODIURIL) 25 MG tablet TAKE 1 TABLET  (25 MG TOTAL) BY MOUTH DAILY. 90 tablet 3   ibuprofen (ADVIL) 800 MG tablet TAKE 1 TABLET BY MOUTH TWICE A DAY AS NEEDED 100 tablet 0   latanoprost (XALATAN) 0.005 % ophthalmic solution SMARTSIG:In Eye(s)     losartan (COZAAR) 100 MG tablet TAKE 1 TABLET BY MOUTH EVERY DAY 90 tablet 3   nystatin cream (MYCOSTATIN) Apply 1 Application topically 2 (two) times daily. 30 g 2   OVER THE COUNTER MEDICATION Collagen for Hair Strength     polyethylene glycol (MIRALAX / GLYCOLAX) 17 g packet Take 17 g by mouth daily.     psyllium (METAMUCIL) 58.6 % powder Take 1 packet by mouth daily.     No current facility-administered medications on file prior to visit.    No Known Allergies  Past Medical History:  Diagnosis Date   Allergic rhinitis due to pollen    Anxiety    Depression    GERD (gastroesophageal reflux disease)     Past Surgical History:  Procedure Laterality Date   ABDOMINAL HYSTERECTOMY  08/1998   BSO fibroids   CHOLECYSTECTOMY  8/12   COLON SURGERY     HERNIA REPAIR  11/2003   RIH (ingram)    Family History  Problem Relation Age of Onset   Heart disease Mother    Stroke Father    Heart disease Brother 68       recent MI   Heart attack Brother    Heart attack Brother    Heart attack Half-Sister    Cancer Paternal Aunt  Colon polyps Neg Hx    Stomach cancer Neg Hx     Social History   Socioeconomic History   Marital status: Married    Spouse name: Not on file   Number of children: Not on file   Years of education: Not on file   Highest education level: Not on file  Occupational History   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    Comment: Retired  Tobacco Use   Smoking status: Never    Passive exposure: Past   Smokeless tobacco: Never  Vaping Use   Vaping status: Never Used  Substance and Sexual Activity   Alcohol use: No   Drug use: No   Sexual activity: Not Currently  Other Topics Concern   Not on file  Social History Narrative   No formal  advanced directives   Son Dema Severin should be health care POA   Would accept resuscitation attempts but no prolonged ventilation or tube feedings   Social Determinants of Health   Financial Resource Strain: Not on file  Food Insecurity: No Food Insecurity (04/17/2023)   Hunger Vital Sign    Worried About Running Out of Food in the Last Year: Never true    Ran Out of Food in the Last Year: Never true  Transportation Needs: No Transportation Needs (04/17/2023)   PRAPARE - Administrator, Civil Service (Medical): No    Lack of Transportation (Non-Medical): No  Physical Activity: Not on file  Stress: Not on file  Social Connections: Not on file  Intimate Partner Violence: Not on file   Review of Systems Appetite is okay--doing intermittent fasting Weight down 5# Sleeps okay Wears seat belt Teeth okay--keeps up with dentist No heartburn or dysphagia Bowels are slow--uses metamucil and miralax. No blood No sig back or joint pain No other skin issues     Objective:   Physical Exam Constitutional:      Appearance: Normal appearance.  HENT:     Mouth/Throat:     Pharynx: No oropharyngeal exudate or posterior oropharyngeal erythema.  Eyes:     Conjunctiva/sclera: Conjunctivae normal.     Pupils: Pupils are equal, round, and reactive to light.  Cardiovascular:     Rate and Rhythm: Normal rate and regular rhythm.     Pulses: Normal pulses.     Heart sounds: No murmur heard.    No gallop.  Pulmonary:     Effort: Pulmonary effort is normal.     Breath sounds: Normal breath sounds. No wheezing or rales.  Abdominal:     Palpations: Abdomen is soft.     Tenderness: There is no abdominal tenderness.  Musculoskeletal:     Cervical back: Neck supple.     Right lower leg: No edema.     Left lower leg: No edema.  Lymphadenopathy:     Cervical: No cervical adenopathy.  Skin:    Findings: No rash.     Comments: Small seb keratosis in left groin  Neurological:     General:  No focal deficit present.     Mental Status: She is alert and oriented to person, place, and time.     Comments: No cognitive issues---declined formal memory testing  Psychiatric:        Mood and Affect: Mood normal.        Behavior: Behavior normal.            Assessment & Plan:

## 2023-10-13 NOTE — Assessment & Plan Note (Signed)
BP Readings from Last 3 Encounters:  10/13/23 122/82  08/24/23 138/84  08/01/23 130/70   Controlled on hydrochlorothiazide 25 and losartan 100 Will check labs

## 2023-10-13 NOTE — Assessment & Plan Note (Signed)
Mostly panic and episodic anxiety Uses the xanax with good effect

## 2023-10-13 NOTE — Assessment & Plan Note (Signed)
I have personally reviewed the Medicare Annual Wellness questionnaire and have noted 1. The patient's medical and social history 2. Their use of alcohol, tobacco or illicit drugs 3. Their current medications and supplements 4. The patient's functional ability including ADL's, fall risks, home safety risks and hearing or visual             impairment. 5. Diet and physical activities 6. Evidence for depression or mood disorders  The patients weight, height, BMI and visual acuity have been recorded in the chart I have made referrals, counseling and provided education to the patient based review of the above and I have provided the pt with a written personalized care plan for preventive services.  I have provided you with a copy of your personalized plan for preventive services. Please take the time to review along with your updated medication list.  Discussed exercise Flu vaccine today Doesn't want COVID vaccine FIT Had mammogram and needs follow up in January

## 2023-10-14 LAB — HEPATITIS C ANTIBODY: Hepatitis C Ab: NONREACTIVE

## 2023-10-17 ENCOUNTER — Other Ambulatory Visit: Payer: Self-pay | Admitting: Radiology

## 2023-10-17 DIAGNOSIS — Z1211 Encounter for screening for malignant neoplasm of colon: Secondary | ICD-10-CM

## 2023-10-20 ENCOUNTER — Other Ambulatory Visit: Payer: Self-pay | Admitting: Internal Medicine

## 2023-10-20 NOTE — Telephone Encounter (Signed)
Refill request for ALPRAZOLAM 0.5 MG TABLET   LOV - 10/13/23 Next OV - not scheduled Last refill - 09/22/23 #60/0

## 2023-10-23 LAB — FECAL OCCULT BLOOD, IMMUNOCHEMICAL: Fecal Occult Bld: NEGATIVE

## 2023-10-24 ENCOUNTER — Other Ambulatory Visit: Payer: Self-pay | Admitting: Internal Medicine

## 2023-11-06 ENCOUNTER — Encounter: Payer: Self-pay | Admitting: Internal Medicine

## 2023-11-06 ENCOUNTER — Ambulatory Visit (INDEPENDENT_AMBULATORY_CARE_PROVIDER_SITE_OTHER): Payer: Medicare HMO | Admitting: Internal Medicine

## 2023-11-06 ENCOUNTER — Telehealth: Payer: Self-pay | Admitting: Internal Medicine

## 2023-11-06 VITALS — BP 124/88 | HR 69 | Temp 98.3°F | Ht 64.25 in | Wt 192.0 lb

## 2023-11-06 DIAGNOSIS — I10 Essential (primary) hypertension: Secondary | ICD-10-CM

## 2023-11-06 MED ORDER — ATORVASTATIN CALCIUM 10 MG PO TABS: 10.0000 mg | ORAL_TABLET | ORAL | 3 refills | Status: DC

## 2023-11-06 MED ORDER — AMLODIPINE BESYLATE 5 MG PO TABS: 5.0000 mg | ORAL_TABLET | Freq: Every day | ORAL | 3 refills | Status: DC

## 2023-11-06 NOTE — Telephone Encounter (Addendum)
Did not get record from access nurse but pts note was included on access note for pts husband. Caller states that he has an elevated blood pressure of 145/103 and 151/94. His wife has a blood pressure of 173/98 and 149/93 and she has a slight headache. They would like to be seen in the office. Chart 1/2 [place]. Pt already has appt with Dr Alphonsus Sias on 11/06/23 at 2:45 . I spoke with pt; pt took her BP about 9 AM 145/103 P 109. Pt said after taking ibuprofen H/A has gone away.pt has already taken BP meds this morning and pt has not missed taking any meds. UC & ED precautions given and pt voiced understanding,.Sending note to DR Alphonsus Sias and [place] pool.

## 2023-11-06 NOTE — Telephone Encounter (Signed)
Okay---I will check her this afternoon

## 2023-11-06 NOTE — Telephone Encounter (Signed)
FYI: This call has been transferred to Access Nurse. Once the result note has been entered staff can address the message at that time.  Patient called in with the following symptoms:  Red Word:elevated blood pressure at 8:30 pm last night was 149/93 with slight headache. Patient has not taken it this morning    Please advise at Mobile [phone] (mobile)  Message is routed to Provider Pool and [facility] Triage

## 2023-11-06 NOTE — Progress Notes (Signed)
Subjective:    Patient ID: [name], female    DOB: 04/19/1956, 67 y.o.   MRN: [id number]  HPI Here due to elevated blood pressure at home  Has been checking husband's BP--and it is up worse Hers has also been high---notes a lot of stress (worrying about him constantly) Measurements have been at rest, etc Did calibrate the machine (new) here with husband and Dr Ermalene Searing As high as 173/98, also 149/93 and 145/103  Took an extra 100mg  losartan a while ago  Some headache--not typical for her (thinks it is from the BP, not the other way around) No chest pain or SOB  Current Outpatient Medications on File Prior to Visit  Medication Sig Dispense Refill   ALPRAZolam (XANAX) 0.5 MG tablet TAKE 1/2 TO 1 TABLET BY MOUTH TWICE A DAY AS NEEDED FOR ANXIETY 60 tablet 0   Calcium Carb-Cholecalciferol 600-200 MG-UNIT TABS Take 3 tablets by mouth daily.     chlorhexidine (PERIDEX) 0.12 % solution Use as directed 5 mLs in the mouth or throat 2 (two) times daily. 473 mL 5   estradiol (ESTRACE) 0.5 MG tablet Take 1 tablet (0.5 mg total) by mouth daily. 90 tablet 3   hydrochlorothiazide (HYDRODIURIL) 25 MG tablet TAKE 1 TABLET (25 MG TOTAL) BY MOUTH DAILY. 90 tablet 3   ibuprofen (ADVIL) 800 MG tablet TAKE 1 TABLET BY MOUTH TWICE A DAY AS NEEDED 100 tablet 0   latanoprost (XALATAN) 0.005 % ophthalmic solution SMARTSIG:In Eye(s)     losartan (COZAAR) 100 MG tablet TAKE 1 TABLET BY MOUTH EVERY DAY 90 tablet 3   nystatin cream (MYCOSTATIN) Apply 1 Application topically 2 (two) times daily. 30 g 2   OVER THE COUNTER MEDICATION Collagen for Hair Strength     polyethylene glycol (MIRALAX / GLYCOLAX) 17 g packet Take 17 g by mouth daily.     psyllium (METAMUCIL) 58.6 % powder Take 1 packet by mouth daily.     No current facility-administered medications on file prior to visit.    No Known Allergies  Past Medical History:  Diagnosis Date   Allergic rhinitis due to pollen    Anxiety    Depression     GERD (gastroesophageal reflux disease)     Past Surgical History:  Procedure Laterality Date   ABDOMINAL HYSTERECTOMY  08/1998   BSO fibroids   CHOLECYSTECTOMY  8/12   COLON SURGERY     HERNIA REPAIR  11/2003   RIH (ingram)    Family History  Problem Relation Age of Onset   Heart disease Mother    Stroke Father    Heart disease Brother 53       recent MI   Heart attack Brother    Heart attack Brother    Heart attack Half-Sister    Cancer Paternal Aunt    Colon polyps Neg Hx    Stomach cancer Neg Hx     Social History   Socioeconomic History   Marital status: Married    Spouse name: Not on file   Number of children: Not on file   Years of education: Not on file   Highest education level: Not on file  Occupational History   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    Comment: Retired  Tobacco Use   Smoking status: Never    Passive exposure: Past   Smokeless tobacco: Never  Vaping Use   Vaping status: Never Used  Substance and Sexual Activity   Alcohol use: No   Drug use:  No   Sexual activity: Not Currently  Other Topics Concern   Not on file  Social History Narrative   No formal advanced directives   Son Dema Severin should be health care POA   Would accept resuscitation attempts but no prolonged ventilation or tube feedings   Social Determinants of Health   Financial Resource Strain: Not on file  Food Insecurity: No Food Insecurity (04/17/2023)   Hunger Vital Sign    Worried About Running Out of Food in the Last Year: Never true    Ran Out of Food in the Last Year: Never true  Transportation Needs: No Transportation Needs (04/17/2023)   PRAPARE - Administrator, Civil Service (Medical): No    Lack of Transportation (Non-Medical): No  Physical Activity: Not on file  Stress: Not on file  Social Connections: Not on file  Intimate Partner Violence: Not on file   Review of Systems Does tend to be compulsive--and tough getting husband to do what he  is supposed to (like refuses his meds at times) Eating okay now--had been eating some chips with salt till about 5 days ago    Objective:   Physical Exam Constitutional:      Appearance: Normal appearance.  Cardiovascular:     Rate and Rhythm: Normal rate and regular rhythm.     Heart sounds: No murmur heard.    No gallop.  Pulmonary:     Effort: Pulmonary effort is normal.     Breath sounds: Normal breath sounds. No wheezing or rales.  Musculoskeletal:     Cervical back: Neck supple.     Right lower leg: No edema.     Left lower leg: No edema.  Lymphadenopathy:     Cervical: No cervical adenopathy.  Neurological:     Mental Status: She is alert.            Assessment & Plan:

## 2023-11-06 NOTE — Assessment & Plan Note (Addendum)
BP Readings from Last 3 Encounters:  11/06/23 124/88  10/13/23 122/82  08/24/23 138/84   Repeat 146/86 Will add amlodipine 5mg  and she will monitor at home  She is very concerned due to her FH of strokes. Discussed statin---and will start 10mg  three days a week Is going to cardiologist in February

## 2023-11-17 ENCOUNTER — Other Ambulatory Visit: Payer: Self-pay | Admitting: Internal Medicine

## 2023-11-22 ENCOUNTER — Other Ambulatory Visit: Payer: Self-pay | Admitting: Internal Medicine

## 2023-11-22 MED ORDER — ALPRAZOLAM 0.5 MG PO TABS: ORAL_TABLET | ORAL | 0 refills | Status: DC

## 2023-11-22 NOTE — Telephone Encounter (Signed)
Last filled 10-20-23 #60 Last OV 11-06-23 No Future OV [facility]

## 2023-11-22 NOTE — Telephone Encounter (Signed)
Prescription Request  11/22/2023  LOV: 11/06/2023  What is the name of the medication or equipment?  ALPRAZolam (XANAX) 0.5 MG tablet   Have you contacted your pharmacy to request a refill? No   Which pharmacy would you like this sent to?   [id number], [place] - [address] [place] [address] Phone: [phone] Fax: [phone]    Patient notified that their request is being sent to the clinical staff for review and that they should receive a response within 2 business days.   Please advise at Mobile [phone] (mobile)

## 2023-12-09 ENCOUNTER — Other Ambulatory Visit: Payer: Self-pay | Admitting: Internal Medicine

## 2023-12-24 ENCOUNTER — Other Ambulatory Visit: Payer: Self-pay | Admitting: Internal Medicine

## 2023-12-25 ENCOUNTER — Other Ambulatory Visit: Payer: Medicare HMO

## 2023-12-25 NOTE — Telephone Encounter (Signed)
 Last filled 11-22-23 #60 Last OV 11-06-23 No Future OV [facility]

## 2024-01-02 ENCOUNTER — Ambulatory Visit
Admission: RE | Admit: 2024-01-02 | Discharge: 2024-01-02 | Disposition: A | Discharge status: Home / Self Care | Payer: Medicare HMO | Source: Ambulatory Visit | Attending: Internal Medicine | Admitting: Internal Medicine

## 2024-01-02 DIAGNOSIS — N6012 Diffuse cystic mastopathy of left breast: Secondary | ICD-10-CM | POA: Diagnosis not present

## 2024-01-02 DIAGNOSIS — N6320 Unspecified lump in the left breast, unspecified quadrant: Secondary | ICD-10-CM

## 2024-01-02 DIAGNOSIS — N6325 Unspecified lump in the left breast, overlapping quadrants: Secondary | ICD-10-CM | POA: Diagnosis not present

## 2024-01-23 DIAGNOSIS — Z0100 Encounter for examination of eyes and vision without abnormal findings: Secondary | ICD-10-CM | POA: Diagnosis not present

## 2024-01-24 NOTE — Progress Notes (Signed)
 Chief Complaint  Patient presents with   New Patient (Initial Visit)    Chest pain   History of Present Illness: 68 yo female with history of anxiety and HTN who is here today as a new consult, referred by Dr. Jimmy, for the evaluation of chest pain. She has blood pressure that is well controlled. She has panic attacks. She has a strong family history of CAD in her mother and two brothers. She has some chest pain when she is anxious. She has dyspnea with moderate exertion. Progressive fatigue.   I also take care of her husband.   Primary Care Physician: Sherry Charlie FERNS, MD   Past Medical History:  Diagnosis Date   Allergic rhinitis due to pollen    Anxiety    Depression    GERD (gastroesophageal reflux disease)     Past Surgical History:  Procedure Laterality Date   ABDOMINAL HYSTERECTOMY  08/1998   BSO fibroids   CHOLECYSTECTOMY  8/12   COLON SURGERY     HERNIA REPAIR  11/2003   RIH (ingram)    Current Outpatient Medications  Medication Sig Dispense Refill   ALPRAZolam  (XANAX ) 0.5 MG tablet TAKE 1/2 TO 1 TABLET BY MOUTH TWICE A DAY AS NEEDED FOR ANXIETY 60 tablet 0   amLODipine  (NORVASC ) 5 MG tablet Take 1 tablet (5 mg total) by mouth daily. 90 tablet 3   Calcium  Carb-Cholecalciferol 600-200 MG-UNIT TABS Take 3 tablets by mouth daily.     chlorhexidine  (PERIDEX ) 0.12 % solution Use as directed 5 mLs in the mouth or throat 2 (two) times daily. 473 mL 5   estradiol  (ESTRACE ) 0.5 MG tablet TAKE 1 TABLET BY MOUTH EVERY DAY 90 tablet 3   hydrochlorothiazide  (HYDRODIURIL ) 25 MG tablet TAKE 1 TABLET (25 MG TOTAL) BY MOUTH DAILY. 90 tablet 3   ibuprofen  (ADVIL ) 800 MG tablet TAKE 1 TABLET BY MOUTH TWICE A DAY AS NEEDED 100 tablet 0   latanoprost (XALATAN) 0.005 % ophthalmic solution SMARTSIG:In Eye(s)     losartan  (COZAAR ) 100 MG tablet TAKE 1 TABLET BY MOUTH EVERY DAY 90 tablet 3   metoprolol  tartrate (LOPRESSOR ) 50 MG tablet Take 1 tablet (50 mg total) by mouth once for 1  dose. Take 90-120 minutes prior to scan. Hold for SBP less than 110. 1 tablet 0   nystatin  cream (MYCOSTATIN ) Apply 1 Application topically 2 (two) times daily. (Patient taking differently: Apply 1 Application topically as needed for dry skin.) 30 g 2   OVER THE COUNTER MEDICATION Collagen for Hair Strength     permethrin  (ELIMITE ) 5 % cream as needed (Used for scabies).     polyethylene glycol (MIRALAX  / GLYCOLAX ) 17 g packet Take 17 g by mouth daily.     psyllium (METAMUCIL) 58.6 % powder Take 1 packet by mouth as needed (Only uses once or twice a month if needed).     No current facility-administered medications for this visit.    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  Tobacco Use   Smoking status: Never    Passive exposure: Past   Smokeless tobacco: Never  Vaping Use   Vaping status: Never Used  Substance and Sexual Activity  Alcohol use: No   Drug use: No   Sexual activity: Not Currently  Other Topics Concern   Not on file  Social History Narrative   No formal advanced directives   Son [name] should be health care POA   Would accept resuscitation attempts but no prolonged ventilation or tube feedings   Social Drivers of Health   Financial Resource Strain: Not on file  Food Insecurity: No Food Insecurity (04/17/2023)   Hunger Vital Sign    Worried About Running Out of Food in the Last Year: Never true    Ran Out of Food in the Last Year: Never true  Transportation Needs: No Transportation Needs (04/17/2023)   PRAPARE - Administrator, Civil Service (Medical): No    Lack of Transportation (Non-Medical): No  Physical Activity: Not on file  Stress: Not on file  Social Connections: Not on file   Intimate Partner Violence: Not on file    Family History  Problem Relation Age of Onset   Heart disease Mother        Heart attack   Stroke Father    Heart disease Brother 31       recent MI   Heart attack Brother    Heart attack Brother    Cancer Paternal Aunt    Heart attack Half-Sister    Colon polyps Neg Hx    Stomach cancer Neg Hx     Review of Systems:  As stated in the HPI and otherwise negative.   BP 110/82   Pulse 66   Ht 5' 5 (1.651 m)   Wt 88.9 kg   SpO2 98%   BMI 32.62 kg/m   Physical Examination: General: Well developed, well nourished, NAD  HEENT: OP clear, mucus membranes moist  SKIN: warm, dry. No rashes. Neuro: No focal deficits  Musculoskeletal: Muscle strength 5/5 all ext  Psychiatric: Mood and affect normal  Neck: No JVD, no carotid bruits, no thyromegaly, no lymphadenopathy.  Lungs:Clear bilaterally, no wheezes, rhonci, crackles Cardiovascular: Regular rate and rhythm. No murmurs, gallops or rubs. Abdomen:Soft. Bowel sounds present. Non-tender.  Extremities: No lower extremity edema. Pulses are 2 + in the bilateral DP/PT.  EKG:  EKG is ordered today. The ekg ordered today demonstrates  EKG Interpretation Date/Time:  Thursday January 25 2024 10:08:00 EST Ventricular Rate:  66 PR Interval:  112 QRS Duration:  82 QT Interval:  418 QTC Calculation: 438 R Axis:   43  Text Interpretation: Normal sinus rhythm Nonspecific ST and T wave abnormality Confirmed by Verlin Bruckner [phone]) on 01/25/2024 10:11:56 AM    Recent Labs: 10/13/2023: ALT 17; BUN 11; Creatinine, Ser 0.70; Hemoglobin 14.6; Platelets 249.0; Potassium 3.7; Sodium 140; TSH 1.28   Lipid Panel    Component Value Date/Time   CHOL 145 10/13/2023 1027   TRIG 142.0 10/13/2023 1027   HDL 50.30 10/13/2023 1027   CHOLHDL 3 10/13/2023 1027   VLDL 28.4 10/13/2023 1027   LDLCALC 66 10/13/2023 1027   LDLDIRECT 97.0 10/07/2022 1026     Wt Readings from Last 3 Encounters:   01/25/24 88.9 kg  11/06/23 87.1 kg  10/13/23 85.7 kg    Assessment and Plan:   1. Chest pain/Dyspnea/Fatigue: Risk factors for CAD include HTN and age. She has a strong family history of CAD in both brothers and her mother. Will arrange an echo to assess LV function and exclude structural heart disease. Will arrange a coronary CTA to exclude obstructive CAD.   Labs/ tests  ordered today include:   Orders Placed This Encounter  Procedures   CT CORONARY MORPH W/CTA COR W/SCORE W/CA W/CM &/OR WO/CM   Basic Metabolic Panel (BMET)   EKG 12-Lead   ECHOCARDIOGRAM COMPLETE   Disposition:   F/U with me in 6 months post testing    Signed, Lonni Cash, MD, [facility] 01/25/2024 11:02 AM    [facility] [address] [place], [place], [place]  [address] Phone: [phone]; Fax: [phone]

## 2024-01-25 ENCOUNTER — Ambulatory Visit: Payer: Medicare HMO | Attending: Cardiovascular Disease | Admitting: Cardiovascular Disease

## 2024-01-25 ENCOUNTER — Encounter: Payer: Self-pay | Admitting: Cardiovascular Disease

## 2024-01-25 VITALS — BP 110/82 | HR 66 | Ht 65.0 in | Wt 196.0 lb

## 2024-01-25 DIAGNOSIS — R0609 Other forms of dyspnea: Secondary | ICD-10-CM | POA: Diagnosis not present

## 2024-01-25 DIAGNOSIS — I10 Essential (primary) hypertension: Secondary | ICD-10-CM

## 2024-01-25 DIAGNOSIS — R079 Chest pain, unspecified: Secondary | ICD-10-CM

## 2024-01-25 MED ORDER — METOPROLOL TARTRATE 50 MG PO TABS: 50.0000 mg | ORAL_TABLET | Freq: Once | ORAL | 0 refills | Status: DC

## 2024-01-25 NOTE — Patient Instructions (Addendum)
 Medication Instructions:  No changes today *If you need a refill on your cardiac medications before your next appointment, please call your pharmacy*   Lab Work: None today   Testing/Procedures: Your physician has requested that you have an echocardiogram. Echocardiography is a painless test that uses sound waves to create images of your heart. It provides your doctor with information about the size and shape of your heart and how well your heart's chambers and valves are working. This procedure takes approximately one hour. There are no restrictions for this procedure. Please do NOT wear cologne, perfume, aftershave, or lotions (deodorant is allowed). Please arrive 15 minutes prior to your appointment time.  Please note: We ask at that you not bring children with you during ultrasound (echo/ vascular) testing. Due to room size and safety concerns, children are not allowed in the ultrasound rooms during exams. Our front office staff cannot provide observation of children in our lobby area while testing is being conducted. An adult accompanying a patient to their appointment will only be allowed in the ultrasound room at the discretion of the ultrasound technician under special circumstances. We apologize for any inconvenience.  Coronary CT angiogram - see instructions below   Follow-Up: At [facility], you and your health needs are our priority.  As part of our continuing mission to provide you with exceptional heart care, we have created designated Provider Care Teams.  These Care Teams include your primary Cardiologist (physician) and Advanced Practice Providers (APPs -  Physician Assistants and Nurse Practitioners) who all work together to provide you with the care you need, when you need it.  We recommend signing up for the patient portal called MyChart.  Sign up information is provided on this After Visit Summary.  MyChart is used to connect with patients for Virtual Visits  (Telemedicine).  Patients are able to view lab/test results, encounter notes, upcoming appointments, etc.  Non-urgent messages can be sent to your provider as well.   To learn more about what you can do with MyChart, go to [email or web address].    Your next appointment:   4 month(s)  Provider:   Lonni Cash, MD        Your cardiac CT will be scheduled at  [facility] [address] [place], [place] [address] [phone]  Please arrive at the [facility] and Children's Entrance (Entrance C2) of [facility] 30 minutes prior to test start time. You can use the FREE valet parking offered at entrance C (encouraged to control the heart rate for the test)  Proceed to the [facility] Radiology Department (first floor) to check-in and test prep.  All radiology patients and guests should use entrance C2 at [facility], accessed from [facility], even though the hospital's physical address listed is [address]     Please follow these instructions carefully (unless otherwise directed):  An IV will be required for this test and Nitroglycerin  will be given.   On the Night Before the Test: Be sure to Drink plenty of water. Do not consume any caffeinated/decaffeinated beverages or chocolate 12 hours prior to your test. Do not take any antihistamines 12 hours prior to your test.  On the Day of the Test: Drink plenty of water until 1 hour prior to the test. Do not eat any food 1 hour prior to test. You may take your regular medications prior to the test.  Take metoprolol  (Lopressor ) two hours prior to test. Please HOLD  hydrochlorothiazide  on the morning of the test. FEMALES- please wear underwire-free bra if available, avoid dresses & tight clothing   After the Test: Drink plenty of water. After receiving IV contrast, you may experience a mild flushed feeling. This is normal. On occasion, you may experience a mild rash up to  24 hours after the test. This is not dangerous. If this occurs, you can take Benadryl 25 mg, Zyrtec, Claritin, or Allegra and increase your fluid intake. (Patients taking Tikosyn should avoid Benadryl, and may take Zyrtec, Claritin, or Allegra) If you experience trouble breathing, this can be serious. If it is severe call 911 IMMEDIATELY. If it is mild, please call our office.  We will call to schedule your test 2-4 weeks out understanding that some insurance companies will need an authorization prior to the service being performed.   For more information and frequently asked questions, please visit our website : [email or web address]  For non-scheduling related questions, please contact the cardiac imaging nurse navigator should you have any questions/concerns: Cardiac Imaging Nurse Navigators Direct Office Dial: [phone]   For scheduling needs, including cancellations and rescheduling, please call Brittany, [phone].

## 2024-01-30 DIAGNOSIS — K469 Unspecified abdominal hernia without obstruction or gangrene: Secondary | ICD-10-CM | POA: Diagnosis not present

## 2024-01-30 DIAGNOSIS — N816 Rectocele: Secondary | ICD-10-CM | POA: Diagnosis not present

## 2024-01-31 ENCOUNTER — Other Ambulatory Visit: Payer: Self-pay | Admitting: Internal Medicine

## 2024-02-20 ENCOUNTER — Other Ambulatory Visit: Payer: Self-pay | Admitting: Internal Medicine

## 2024-02-20 NOTE — Telephone Encounter (Signed)
 Last filled 12-25-23 #60 Last OV 11-06-23 No Future OV [facility]

## 2024-02-27 DIAGNOSIS — H40033 Anatomical narrow angle, bilateral: Secondary | ICD-10-CM | POA: Diagnosis not present

## 2024-03-20 ENCOUNTER — Other Ambulatory Visit: Payer: Self-pay | Admitting: Internal Medicine

## 2024-03-22 ENCOUNTER — Other Ambulatory Visit: Payer: Self-pay | Admitting: Internal Medicine

## 2024-03-22 NOTE — Telephone Encounter (Signed)
 Last filled 02-20-24#60 Last OV 11-06-23 No Future OV [facility]

## 2024-04-22 ENCOUNTER — Other Ambulatory Visit: Payer: Self-pay | Admitting: Internal Medicine

## 2024-04-22 NOTE — Telephone Encounter (Signed)
 Last filled 03-23-24 #60 Last OV 11-06-23 No Future OV [facility]

## 2024-04-26 ENCOUNTER — Other Ambulatory Visit: Payer: Self-pay | Admitting: Internal Medicine

## 2024-04-26 DIAGNOSIS — Z1231 Encounter for screening mammogram for malignant neoplasm of breast: Secondary | ICD-10-CM

## 2024-04-29 ENCOUNTER — Ambulatory Visit (HOSPITAL_COMMUNITY): Attending: Cardiology

## 2024-04-29 DIAGNOSIS — I10 Essential (primary) hypertension: Secondary | ICD-10-CM | POA: Insufficient documentation

## 2024-04-29 DIAGNOSIS — R0609 Other forms of dyspnea: Secondary | ICD-10-CM | POA: Insufficient documentation

## 2024-04-29 LAB — ECHOCARDIOGRAM COMPLETE
Area-P 1/2: 3.66 cm2
S' Lateral: 3.3 cm

## 2024-05-01 ENCOUNTER — Ambulatory Visit: Payer: Self-pay

## 2024-05-11 ENCOUNTER — Other Ambulatory Visit: Payer: Self-pay | Admitting: Internal Medicine

## 2024-05-23 ENCOUNTER — Other Ambulatory Visit: Payer: Self-pay | Admitting: Internal Medicine

## 2024-05-23 NOTE — Telephone Encounter (Signed)
 Last filled 04-22-24 #60 Last OV 11-06-23 No Future OV [facility]

## 2024-06-10 ENCOUNTER — Ambulatory Visit
Admission: RE | Admit: 2024-06-10 | Discharge: 2024-06-10 | Disposition: A | Discharge status: Home / Self Care | Source: Ambulatory Visit | Attending: Internal Medicine | Admitting: Internal Medicine

## 2024-06-10 DIAGNOSIS — Z1231 Encounter for screening mammogram for malignant neoplasm of breast: Secondary | ICD-10-CM | POA: Diagnosis not present

## 2024-06-13 ENCOUNTER — Ambulatory Visit: Payer: Self-pay | Admitting: Internal Medicine

## 2024-06-19 DIAGNOSIS — B86 Scabies: Secondary | ICD-10-CM | POA: Diagnosis not present

## 2024-06-19 DIAGNOSIS — B078 Other viral warts: Secondary | ICD-10-CM | POA: Diagnosis not present

## 2024-06-24 ENCOUNTER — Other Ambulatory Visit: Payer: Self-pay | Admitting: Internal Medicine

## 2024-06-24 NOTE — Telephone Encounter (Signed)
 Refill request for ALPRAZOLAM  0.5 MG TABLET   LOV - 11/06/23 Next OV - 10/15/24 with Adina Crandall, NP Last refill - 05/24/24 #60/0

## 2024-06-26 ENCOUNTER — Encounter: Payer: Self-pay | Admitting: Obstetrics and Gynecology

## 2024-06-26 ENCOUNTER — Ambulatory Visit: Admitting: Obstetrics and Gynecology

## 2024-06-26 VITALS — BP 101/63 | HR 73

## 2024-06-26 DIAGNOSIS — N393 Stress incontinence (female) (male): Secondary | ICD-10-CM

## 2024-06-26 DIAGNOSIS — N993 Prolapse of vaginal vault after hysterectomy: Secondary | ICD-10-CM

## 2024-06-26 NOTE — Progress Notes (Signed)
 [facility] Return Visit  SUBJECTIVE  History of Present Illness: [name] is a 68 y.o. female seen in follow-up for prolapse. Last seen 2023 and was fitted with a cube pessary at that time. She removed the pessary herself.    Has noticed the prolapse has been getting worse over time. Worse with lifting.   Having regular bowel movements. Was taking miralax daily but now drinking a smoothie which helps her be regular. Has to push on the vaginal wall to have a bowel movement.   She urinates about every hour during the day. Does not get up at night to void. She drinks water, black and green tea, smoothies. Denies any urine leakage but sometimes has a little dribble of urine after standing with urination. Does not wear a pad.   Not currently sexually active.    Past Medical History: Patient  has a past medical history of Allergic rhinitis due to pollen, Anxiety, Depression, and GERD (gastroesophageal reflux disease).   Past Surgical History: She  has a past surgical history that includes Abdominal hysterectomy (08/1998); Hernia repair (11/2003); Colon surgery; and Cholecystectomy (8/12).   Medications: She has a current medication list which includes the following prescription(s): alprazolam , amlodipine , atorvastatin , calcium  carb-cholecalciferol, chlorhexidine , estradiol , hydrochlorothiazide , ibuprofen , ivermectin, latanoprost, losartan , metoprolol  tartrate, nystatin  cream, OVER THE COUNTER MEDICATION, permethrin , polyethylene glycol, and psyllium.   Allergies: Patient has no known allergies.   Social History: Patient  reports that she has never smoked. She has been exposed to tobacco smoke. She has never used smokeless tobacco. She reports that she does not drink alcohol and does not use drugs.     OBJECTIVE     Physical Exam: Vitals:   06/26/24 0919  BP: 101/63  Pulse: 73   Gen: No apparent distress, A&O x 3.  Detailed Urogynecologic Evaluation:  Normal  external genitalia. Complete procidentia noted. Normal vaginal mucosa. On bimanual, no masses present.    POP-Q  3                                            Aa   5.5                                           Ba  5.5                                              C   7                                            Gh  4                                            Pb  7  tvl   3                                            Ap  5.5                                            Bp                                                 D      ASSESSMENT AND PLAN    Ms. Speak is a 68 y.o. with:  1. Vaginal vault prolapse after hysterectomy   2. SUI (stress urinary incontinence, female)    Stage IV POP - Has previously tried a pessary and is interested in prolapse repair.  - We discussed surgical options of colpocleisis and robotic sacrocolpopexy. She does not wish to be sexually active in the future. We discussed that colpocleisis would close off the vagina but has a very good success rate, with <5% recurrence. She wishes to proceed. Handout provided for her to review.  - Discussed urodynamic testing to assess for SUI. She has some dribbling at baseline.   Return for urodynamics  [name], MD

## 2024-06-26 NOTE — Patient Instructions (Signed)

## 2024-07-03 ENCOUNTER — Telehealth (HOSPITAL_COMMUNITY): Payer: Self-pay | Admitting: *Deleted

## 2024-07-03 NOTE — Telephone Encounter (Signed)
 Attempted to call patient regarding upcoming cardiac CT appointment. Unable to leave VM. Johney Frame RN Navigator Cardiac Imaging [facility] [phone] Office

## 2024-07-04 ENCOUNTER — Ambulatory Visit (HOSPITAL_COMMUNITY)
Admission: RE | Admit: 2024-07-04 | Discharge: 2024-07-04 | Disposition: A | Discharge status: Home / Self Care | Source: Ambulatory Visit | Attending: Cardiovascular Disease | Admitting: Cardiovascular Disease

## 2024-07-04 DIAGNOSIS — R079 Chest pain, unspecified: Secondary | ICD-10-CM | POA: Insufficient documentation

## 2024-07-04 DIAGNOSIS — R0609 Other forms of dyspnea: Secondary | ICD-10-CM | POA: Diagnosis not present

## 2024-07-04 MED ORDER — NITROGLYCERIN 0.4 MG SL SUBL
0.8000 mg | SUBLINGUAL_TABLET | Freq: Once | SUBLINGUAL | Status: AC
  Administered 2024-07-04: 0.8 mg via SUBLINGUAL

## 2024-07-04 MED ORDER — IOHEXOL 350 MG/ML SOLN
100.0000 mL | Freq: Once | INTRAVENOUS | Status: AC | PRN
  Administered 2024-07-04: 100 mL via INTRAVENOUS

## 2024-07-17 ENCOUNTER — Ambulatory Visit (INDEPENDENT_AMBULATORY_CARE_PROVIDER_SITE_OTHER): Admitting: Obstetrics and Gynecology

## 2024-07-17 VITALS — BP 115/78 | HR 69

## 2024-07-17 DIAGNOSIS — N393 Stress incontinence (female) (male): Secondary | ICD-10-CM | POA: Diagnosis not present

## 2024-07-17 DIAGNOSIS — R948 Abnormal results of function studies of other organs and systems: Secondary | ICD-10-CM

## 2024-07-17 DIAGNOSIS — R350 Frequency of micturition: Secondary | ICD-10-CM

## 2024-07-17 DIAGNOSIS — N993 Prolapse of vaginal vault after hysterectomy: Secondary | ICD-10-CM

## 2024-07-17 LAB — POCT URINALYSIS DIP (CLINITEK)
Bilirubin, UA: NEGATIVE
Blood, UA: NEGATIVE
Glucose, UA: NEGATIVE mg/dL
Ketones, POC UA: NEGATIVE mg/dL
Leukocytes, UA: NEGATIVE
Nitrite, UA: NEGATIVE
POC PROTEIN,UA: NEGATIVE
Spec Grav, UA: 1.015 (ref 1.010–1.025)
Urobilinogen, UA: 0.2 U/dL
pH, UA: 7 (ref 5.0–8.0)

## 2024-07-17 NOTE — Progress Notes (Signed)
 [facility] Urodynamics Procedure  Referring Physician: Jimmy Charlie FERNS, MD Date of Procedure: 07/17/2024  [name] is a 68 y.o. female who presents for urodynamic evaluation. Indication(s) for study: occult SUI and Stage IV POP  Vital Signs: BP 115/78   Pulse 69   Laboratory Results: A catheterized urine specimen revealed:  POC urine:  Lab Results  Component Value Date   COLORU yellow 07/17/2024   CLARITYU clear 07/17/2024   GLUCOSEUR negative 07/17/2024   BILIRUBINUR negative 07/17/2024   KETONESU Negative 03/30/2022   SPECGRAV 1.015 07/17/2024   RBCUR negative 07/17/2024   PHUR 7.0 07/17/2024   PROTEINUR Negative 03/30/2022   UROBILINOGEN 0.2 07/17/2024   LEUKOCYTESUR Negative 07/17/2024     Voiding Diary: Deferred  Procedure Timeout:  The correct patient was verified and the correct procedure was verified. The patient was in the correct position and safety precautions were reviewed based on at the patient's history.  Urodynamic Procedure A 2F dual lumen urodynamics catheter was placed under sterile conditions into the patient's bladder. A 2F catheter was placed into the rectum in order to measure abdominal pressure. EMG patches were placed in the appropriate position.  All connections were confirmed and calibrations/adjusted made. Saline was instilled into the bladder through the dual lumen catheters.  Cough/valsalva pressures were measured periodically during filling.  Patient was allowed to void.  The bladder was then emptied of its residual.  UROFLOW: Revealed a Qmax of 7 mL/sec.  She voided 55 mL and had a residual of 30 mL.  It was a intermittent pattern and represented normal habits though interpretation limited due to low voided volume.  CMG: This was performed with sterile water in the sitting position at a fill rate of 30 mL/min.    First sensation of fullness was 145 mLs,  First urge was 197 mLs,  Strong urge was 224 mLs and  Capacity  was 391 mLs  Stress incontinence was demonstrated Highest positive Barrier CLPP was 83 cmH20 at 250 ml. Highest negative Barrier VLPP was 105 cmH20 at 253 ml.  Detrusor function was normal, with no phasic contractions seen.    Compliance:  Normal. End fill detrusor pressure was 0cmH20.    UPP: MUCP with barrier reduction was 57 cm of water.    MICTURITION STUDY: Voiding was performed with reduction using scopettes in the sitting position.  Pdet at Qmax was 27 cm of water.  Qmax was 3 mL/sec.  It was a prolonged normal pattern.  She voided 141 mL and had a residual of 250 mL.  It was a volitional void, sustained detrusor contraction was present and abdominal straining was present  EMG: This was performed with patches.  She had voluntary contractions, recruitment with fill was present and urethral sphincter was relaxed with void.  The details of the procedure with the study tracings have been scanned into EPIC.   Urodynamic Impression:  1. Sensation was normal; capacity was reduced 2. Stress Incontinence was demonstrated at normal pressures; 3. Detrusor Overactivity was not demonstrated. 4. Emptying was dysfunctional with a elevated PVR ( ), a sustained detrusor contraction present,  abdominal straining present, normal urethral sphincter activity on EMG.  Plan: - The patient will follow up  to discuss the findings and treatment options.

## 2024-07-19 ENCOUNTER — Telehealth: Payer: Self-pay | Admitting: Cardiovascular Disease

## 2024-07-19 NOTE — Telephone Encounter (Signed)
 Pt would like a c/b to go over CT results

## 2024-07-19 NOTE — Telephone Encounter (Signed)
 Reviewed CT Angio results with patient.  Pleased to hear results.

## 2024-07-22 ENCOUNTER — Other Ambulatory Visit: Payer: Self-pay | Admitting: Internal Medicine

## 2024-07-22 NOTE — Telephone Encounter (Signed)
 Last filled 06-24-24 #60 Last OV 11-06-23 Next OV With Adina Crandall, NP 10-15-24 [facility]

## 2024-07-26 ENCOUNTER — Ambulatory Visit: Admitting: Obstetrics and Gynecology

## 2024-07-26 ENCOUNTER — Encounter: Payer: Self-pay | Admitting: Obstetrics and Gynecology

## 2024-07-26 VITALS — BP 101/66 | HR 71

## 2024-07-26 DIAGNOSIS — N993 Prolapse of vaginal vault after hysterectomy: Secondary | ICD-10-CM

## 2024-07-26 DIAGNOSIS — N393 Stress incontinence (female) (male): Secondary | ICD-10-CM

## 2024-07-26 DIAGNOSIS — R339 Retention of urine, unspecified: Secondary | ICD-10-CM | POA: Diagnosis not present

## 2024-07-26 NOTE — Progress Notes (Signed)
 [facility] Return Visit  SUBJECTIVE  History of Present Illness: [name] is a 68 y.o. female seen in follow-up for prolapse and incontinence. She is interested in proceeding with surgery and underwent urodynamic testing.   Urodynamic Impression:  1. Sensation was normal; capacity was reduced 2. Stress Incontinence was demonstrated at normal pressures; 3. Detrusor Overactivity was not demonstrated. 4. Emptying was dysfunctional with a elevated PVR ( ), a sustained detrusor contraction present,  abdominal straining present, normal urethral sphincter activity on EMG.  Past Medical History: Patient  has a past medical history of Allergic rhinitis due to pollen, Anxiety, Depression, and GERD (gastroesophageal reflux disease).   Past Surgical History: She  has a past surgical history that includes Abdominal hysterectomy (08/1998); Hernia repair (11/2003); Colon surgery; and Cholecystectomy (8/12).   Medications: She has a current medication list which includes the following prescription(s): alprazolam , amlodipine , atorvastatin , calcium  carb-cholecalciferol, chlorhexidine , estradiol , hydrochlorothiazide , ibuprofen , ivermectin, latanoprost, losartan , magnesium, nystatin  cream, OVER THE COUNTER MEDICATION, permethrin , polyethylene glycol, and psyllium.   Allergies: Patient has no known allergies.   Social History: Patient  reports that she has never smoked. She has been exposed to tobacco smoke. She has never used smokeless tobacco. She reports that she does not drink alcohol and does not use drugs.     OBJECTIVE     Physical Exam: Vitals:   07/26/24 1424  BP: 101/66  Pulse: 71   Gen: No apparent distress, A&O x 3.  Detailed Urogynecologic Evaluation:  Deferred. Prior exam showed:  POP-Q   3                                            Aa   5.5                                           Ba   5.5                                              C    7                                             Gh   4                                            Pb   7                                            tvl    3                                            Ap   5.5  Bp                                                  D        ASSESSMENT AND PLAN    Ms. Lavergne is a 68 y.o. with:  1. Vaginal vault prolapse after hysterectomy   2. SUI (stress urinary incontinence, female)   3. Incomplete bladder emptying    -We discussed that due to incomplete bladder emptying, would defer any treatment for stress incontinence until after the prolapse is fixed and this issue resolves. She understands this would mean an interval procedure. We reviewed the options of a sling or urethral bulking. She would prefer urethral bulking in the office if needed.  - Reviewed the risk of regret after colpocleisis since she would not be able to have penetrative intercourse. She understands this risk and prefers to proceed with the surgery as she does not desire to have intercourse.   Plan for surgery: Exam under anesthesia, colpocleisis, levator plication, perineorrhpahy, cystoscopy  - We reviewed the patient's specific anatomic and functional findings, with the assistance of diagrams, and together finalized the above procedure. The planned surgical procedures were discussed along with the surgical risks outlined below, which were also provided on a detailed handout. Additional treatment options including expectant management, conservative management, medical management were discussed where appropriate.  We reviewed the benefits and risks of each treatment option.   General Surgical Risks: For all procedures, there are risks of bleeding, infection, damage to surrounding organs including but not limited to bowel, bladder, blood vessels, ureters and nerves, and need for further surgery if an injury were to occur. These risks are all low with  minimally invasive surgery.   There are risks of numbness and weakness at any body site or buttock/rectal pain.  It is possible that baseline pain can be worsened by surgery, either with or without mesh. If surgery is vaginal, there is also a low risk of possible conversion to laparoscopy or open abdominal incision where indicated. Very rare risks include blood transfusion, blood clot, heart attack, pneumonia, or death.   There is also a risk of short-term postoperative urinary retention with need to use a catheter. About half of patients need to go home from surgery with a catheter, which is then later removed in the office. The risk of long-term need for a catheter is very low. There is also a risk of worsening of overactive bladder.   Prolapse (with or without mesh): Risk factors for surgical failure  include things that put pressure on your pelvis and the surgical repair, including obesity, chronic cough, and heavy lifting or straining (including lifting children or adults, straining on the toilet, or lifting heavy objects such as furniture or anything weighing >25 lbs. Risks of recurrence is 20-30% with vaginal native tissue repair and a less than 10% with sacrocolpopexy with mesh.     - For preop Visit:  She is required to have a visit within 30 days of her surgery.   - Medical clearance: not required  - Anticoagulant use: No - Medicaid Hysterectomy form: n/a - Accepts blood transfusion: Yes - Expected length of stay: outpatient  Request sent for surgery scheduling.   [name], MD

## 2024-08-07 ENCOUNTER — Telehealth: Payer: Self-pay | Admitting: Obstetrics and Gynecology

## 2024-08-07 DIAGNOSIS — H524 Presbyopia: Secondary | ICD-10-CM | POA: Diagnosis not present

## 2024-08-07 NOTE — Telephone Encounter (Signed)
 Patient checking on surgery scheduling. She said phone will go to vm because of her phone settings that she can not change, but she can call right back if a number is left for her to call back to.

## 2024-08-09 ENCOUNTER — Telehealth: Payer: Self-pay

## 2024-08-09 NOTE — Telephone Encounter (Signed)
 Patient checking on surgery scheduling. Would like a call back.

## 2024-08-13 ENCOUNTER — Telehealth: Payer: Self-pay | Admitting: Obstetrics and Gynecology

## 2024-08-13 NOTE — Telephone Encounter (Signed)
 Pt called in checking on status of scheduling surgery.  She said she understood that Dr. Marilynne did surgeries on Mondays.  She said she would be available when ever.  The sooner, the better.  She would like a return call today.

## 2024-08-15 NOTE — Telephone Encounter (Signed)
 Surgery scheduled and pt has been communicated with. KD

## 2024-08-15 NOTE — Telephone Encounter (Signed)
 Scheduled and have communicated with patient [name]

## 2024-08-15 NOTE — Telephone Encounter (Signed)
 Pt called and scheduled KD

## 2024-08-16 ENCOUNTER — Ambulatory Visit (INDEPENDENT_AMBULATORY_CARE_PROVIDER_SITE_OTHER): Admitting: Obstetrics and Gynecology

## 2024-08-16 ENCOUNTER — Encounter: Payer: Self-pay | Admitting: Obstetrics and Gynecology

## 2024-08-16 VITALS — BP 124/78 | HR 84

## 2024-08-16 DIAGNOSIS — Z01818 Encounter for other preprocedural examination: Secondary | ICD-10-CM

## 2024-08-16 DIAGNOSIS — N993 Prolapse of vaginal vault after hysterectomy: Secondary | ICD-10-CM

## 2024-08-16 MED ORDER — POLYETHYLENE GLYCOL 3350 17 GM/SCOOP PO POWD: 17.0000 g | Freq: Every day | ORAL | 0 refills | Status: DC

## 2024-08-16 MED ORDER — OXYCODONE HCL 5 MG PO TABS: 5.0000 mg | ORAL_TABLET | ORAL | 0 refills | Status: DC | PRN

## 2024-08-16 MED ORDER — IBUPROFEN 600 MG PO TABS: 600.0000 mg | ORAL_TABLET | Freq: Four times a day (QID) | ORAL | 0 refills | Status: DC | PRN

## 2024-08-16 MED ORDER — ESTRADIOL 0.1 MG/GM VA CREA: 0.5000 g | TOPICAL_CREAM | VAGINAL | 11 refills | Status: DC

## 2024-08-16 MED ORDER — ACETAMINOPHEN 500 MG PO TABS: 500.0000 mg | ORAL_TABLET | Freq: Four times a day (QID) | ORAL | 0 refills | Status: DC | PRN

## 2024-08-16 NOTE — Progress Notes (Signed)
 [name] Return visit  Subjective Chief Complaint: [name] presents for a for follow up of prolapse  History of Present Illness: [name] is a 68 y.o. female who presents for a follow up.  She is scheduled to undergo Exam under anesthesia, colpocleisis, levator plication, perineorrhpahy, cystoscopy on 09/02/24.  Her symptoms include vaginal bulge and incomplete bladder emptying, and she was was found to have Stage IV anterior, Stage IV posterior, Stage IV apical prolapse.   Urodynamics showed: Urodynamic Impression:  1. Sensation was normal; capacity was reduced 2. Stress Incontinence was demonstrated at normal pressures; 3. Detrusor Overactivity was not demonstrated. 4. Emptying was dysfunctional with a elevated PVR ( ), a sustained detrusor contraction present,  abdominal straining present, normal urethral sphincter activity on EMG.  Past Medical History:  Diagnosis Date   Allergic rhinitis due to pollen    Anxiety    Depression    GERD (gastroesophageal reflux disease)      Past Surgical History:  Procedure Laterality Date   ABDOMINAL HYSTERECTOMY  08/1998   BSO fibroids   CHOLECYSTECTOMY  8/12   COLON SURGERY     HERNIA REPAIR  11/2003   RIH (ingram)    has no known allergies.   Family History  Problem Relation Age of Onset   Heart disease Mother        Heart attack   Stroke Father    Cancer Paternal Aunt    Heart disease Brother 14       recent MI   Heart attack Brother    Heart attack Brother    Heart attack Half-Sister    Colon polyps Neg Hx    Stomach cancer Neg Hx    Breast cancer Neg Hx    BRCA 1/2 Neg Hx     Social History   Tobacco Use   Smoking status: Never    Passive exposure: Past   Smokeless tobacco: Never  Vaping Use   Vaping status: Never Used  Substance Use Topics   Alcohol use: No   Drug use: No     Review of Systems was negative for a full 10 system review except as noted in the History of Present  Illness.   Current Outpatient Medications:    acetaminophen  (TYLENOL ) 500 MG tablet, Take 1 tablet (500 mg total) by mouth every 6 (six) hours as needed (pain)., Disp: 30 tablet, Rfl: 0   ALPRAZolam  (XANAX ) 0.5 MG tablet, TAKE 1/2 TO 1 TABLET BY MOUTH TWICE A DAY AS NEEDED FOR ANXIETY, Disp: 60 tablet, Rfl: 0   amLODipine  (NORVASC ) 5 MG tablet, Take 1 tablet (5 mg total) by mouth daily., Disp: 90 tablet, Rfl: 3   atorvastatin  (LIPITOR) 10 MG tablet, Take 10 mg by mouth 3 (three) times a week., Disp: , Rfl:    Calcium  Carb-Cholecalciferol 600-200 MG-UNIT TABS, Take 3 tablets by mouth daily., Disp: , Rfl:    chlorhexidine  (PERIDEX ) 0.12 % solution, Use as directed 5 mLs in the mouth or throat 2 (two) times daily., Disp: 473 mL, Rfl: 5   [START ON 08/19/2024] estradiol  (ESTRACE ) 0.1 MG/GM vaginal cream, Place 0.5 g vaginally 2 (two) times a week., Disp: 42.5 g, Rfl: 11   estradiol  (ESTRACE ) 0.5 MG tablet, TAKE 1 TABLET BY MOUTH EVERY DAY, Disp: 90 tablet, Rfl: 3   hydrochlorothiazide  (HYDRODIURIL ) 25 MG tablet, TAKE 1 TABLET (25 MG TOTAL) BY MOUTH DAILY., Disp: 90 tablet, Rfl: 3   ibuprofen  (ADVIL ) 600 MG tablet, Take 1 tablet (  600 mg total) by mouth every 6 (six) hours as needed., Disp: 30 tablet, Rfl: 0   ibuprofen  (ADVIL ) 800 MG tablet, TAKE 1 TABLET BY MOUTH TWICE A DAY AS NEEDED, Disp: 100 tablet, Rfl: 0   ivermectin (STROMECTOL) 3 MG TABS tablet, Take by mouth., Disp: , Rfl:    latanoprost (XALATAN) 0.005 % ophthalmic solution, SMARTSIG:In Eye(s), Disp: , Rfl:    losartan  (COZAAR ) 100 MG tablet, TAKE 1 TABLET BY MOUTH EVERY DAY, Disp: 90 tablet, Rfl: 3   Magnesium 250 MG TABS, Take by mouth., Disp: , Rfl:    nystatin  cream (MYCOSTATIN ), Apply 1 Application topically 2 (two) times daily. (Patient taking differently: Apply 1 Application topically as needed for dry skin.), Disp: 30 g, Rfl: 2   OVER THE COUNTER MEDICATION, Collagen for Hair Strength, Disp: , Rfl:    oxyCODONE  (OXY IR/ROXICODONE ) 5  MG immediate release tablet, Take 1 tablet (5 mg total) by mouth every 4 (four) hours as needed for severe pain (pain score 7-10)., Disp: 5 tablet, Rfl: 0   permethrin  (ELIMITE ) 5 % cream, as needed (Used for scabies)., Disp: , Rfl:    polyethylene glycol (MIRALAX  / GLYCOLAX ) 17 g packet, Take 17 g by mouth daily., Disp: , Rfl:    polyethylene glycol powder (GLYCOLAX /MIRALAX ) 17 GM/SCOOP powder, Take 17 g by mouth daily. Drink 17g (1 scoop) dissolved in water per day., Disp: 255 g, Rfl: 0   psyllium (METAMUCIL) 58.6 % powder, Take 1 packet by mouth as needed (Only uses once or twice a month if needed)., Disp: , Rfl:    Objective Vitals:   08/16/24 1236  BP: 124/78  Pulse: 84    Gen: NAD CV: S1 S2 RRR Lungs: Clear to auscultation bilaterally Abd: soft, nontender   Previous Pelvic Exam showed: POP-Q   3                                            Aa   5.5                                           Ba   5.5                                              C    7                                            Gh   4                                            Pb   7                                            tvl    3  Ap   5.5                                            Bp                                                  D         Assessment/ Plan  Assessment: The patient is a 68 y.o. year old scheduled to undergo Exam under anesthesia, colpocleisis, levator plication, perineorrhpahy, cystoscopy.   Plan: General Surgical Consent: The patient has previously been counseled on alternative treatments, and the decision by the patient and provider was to proceed with the procedure listed above.  For all procedures, there are risks of bleeding, infection, damage to surrounding organs including but not limited to bowel, bladder, blood vessels, ureters and nerves, and need for further surgery if an injury were to occur. These risks are all low  with minimally invasive surgery.   There are risks of numbness and weakness at any body site or buttock/rectal pain.  It is possible that baseline pain can be worsened by surgery, either with or without mesh. If surgery is vaginal, there is also a low risk of possible conversion to laparoscopy or open abdominal incision where indicated. Very rare risks include blood transfusion, blood clot, heart attack, pneumonia, or death.   There is also a risk of short-term postoperative urinary retention with need to use a catheter. About half of patients need to go home from surgery with a catheter, which is then later removed in the office. The risk of long-term need for a catheter is very low. There is also a risk of worsening of overactive bladder.     Prolapse (with or without mesh): Risk factors for surgical failure  include things that put pressure on your pelvis and the surgical repair, including obesity, chronic cough, and heavy lifting or straining (including lifting children or adults, straining on the toilet, or lifting heavy objects such as furniture or anything weighing >25 lbs. Risks of recurrence is 20-30% with vaginal native tissue repair and a less than 10% with sacrocolpopexy with mesh, and <5% with colpocleisis.  We discussed consent for blood products. Risks for blood transfusion include allergic reactions, other reactions that can affect different body organs and managed accordingly, transmission of infectious diseases such as HIV or Hepatitis. However, the blood is screened. Patient consents for blood products.  Pre-operative instructions:  She was instructed to not take Aspirin/NSAIDs x 7days prior to surgery.  Antibiotic prophylaxis was ordered as indicated.  Catheter use: Patient will go home with foley if needed after post-operative voiding trial.  Post-operative instructions:  She was provided with specific post-operative instructions, including precautions and signs/symptoms for  which we would recommend contacting us , in addition to daytime and after-hours contact phone numbers. This was provided on a handout.   Post-operative medications: Prescriptions for motrin , tylenol , miralax , and oxycodone  were sent to her pharmacy. Discussed using ibuprofen  and tylenol  on a schedule to limit use of narcotics.   Laboratory testing:  We will check labs: BMP  Preoperative clearance:  She does not require surgical clearance.    Post-operative follow-up:  A post-operative appointment will be made for 6 weeks from the  date of surgery. If she needs a post-operative nurse visit for a voiding trial, that will be set up after she leaves the hospital.    Patient will call the clinic or use MyChart should anything change or any new issues arise.   [name], MD   Time spent: I spent 20 minutes dedicated to the care of this patient on the date of this encounter to include pre-visit review of records, face-to-face time with the patient and post visit documentation.

## 2024-08-16 NOTE — H&P (Signed)
 [facility] Return visit  Subjective Chief Complaint: [name] presents for a for follow up of prolapse  History of Present Illness: [name] is a 68 y.o. female who presents for a follow up.  She is scheduled to undergo Exam under anesthesia, colpocleisis, levator plication, perineorrhpahy, cystoscopy on 09/02/24.  Her symptoms include vaginal bulge and incomplete bladder emptying, and she was was found to have Stage IV anterior, Stage IV posterior, Stage IV apical prolapse.   Urodynamics showed: Urodynamic Impression:  1. Sensation was normal; capacity was reduced 2. Stress Incontinence was demonstrated at normal pressures; 3. Detrusor Overactivity was not demonstrated. 4. Emptying was dysfunctional with a elevated PVR ( ), a sustained detrusor contraction present,  abdominal straining present, normal urethral sphincter activity on EMG.  Past Medical History:  Diagnosis Date   Allergic rhinitis due to pollen    Anxiety    Depression    GERD (gastroesophageal reflux disease)      Past Surgical History:  Procedure Laterality Date   ABDOMINAL HYSTERECTOMY  08/1998   BSO fibroids   CHOLECYSTECTOMY  8/12   COLON SURGERY     HERNIA REPAIR  11/2003   RIH (ingram)    has no known allergies.   Family History  Problem Relation Age of Onset   Heart disease Mother        Heart attack   Stroke Father    Cancer Paternal Aunt    Heart disease Brother 35       recent MI   Heart attack Brother    Heart attack Brother    Heart attack Half-Sister    Colon polyps Neg Hx    Stomach cancer Neg Hx    Breast cancer Neg Hx    BRCA 1/2 Neg Hx     Social History   Tobacco Use   Smoking status: Never    Passive exposure: Past   Smokeless tobacco: Never  Vaping Use   Vaping status: Never Used  Substance Use Topics   Alcohol use: No   Drug use: No     Review of Systems was negative for a full 10 system review except as noted in the History of Present  Illness.  No current facility-administered medications for this encounter.  Current Outpatient Medications:    acetaminophen  (TYLENOL ) 500 MG tablet, Take 1 tablet (500 mg total) by mouth every 6 (six) hours as needed (pain)., Disp: 30 tablet, Rfl: 0   ALPRAZolam  (XANAX ) 0.5 MG tablet, TAKE 1/2 TO 1 TABLET BY MOUTH TWICE A DAY AS NEEDED FOR ANXIETY, Disp: 60 tablet, Rfl: 0   amLODipine  (NORVASC ) 5 MG tablet, Take 1 tablet (5 mg total) by mouth daily., Disp: 90 tablet, Rfl: 3   atorvastatin  (LIPITOR) 10 MG tablet, Take 10 mg by mouth 3 (three) times a week., Disp: , Rfl:    Calcium  Carb-Cholecalciferol 600-200 MG-UNIT TABS, Take 3 tablets by mouth daily., Disp: , Rfl:    chlorhexidine  (PERIDEX ) 0.12 % solution, Use as directed 5 mLs in the mouth or throat 2 (two) times daily., Disp: 473 mL, Rfl: 5   [START ON 08/19/2024] estradiol  (ESTRACE ) 0.1 MG/GM vaginal cream, Place 0.5 g vaginally 2 (two) times a week., Disp: 42.5 g, Rfl: 11   estradiol  (ESTRACE ) 0.5 MG tablet, TAKE 1 TABLET BY MOUTH EVERY DAY, Disp: 90 tablet, Rfl: 3   hydrochlorothiazide  (HYDRODIURIL ) 25 MG tablet, TAKE 1 TABLET (25 MG TOTAL) BY MOUTH DAILY., Disp: 90 tablet, Rfl: 3   ibuprofen  (  ADVIL ) 600 MG tablet, Take 1 tablet (600 mg total) by mouth every 6 (six) hours as needed., Disp: 30 tablet, Rfl: 0   ibuprofen  (ADVIL ) 800 MG tablet, TAKE 1 TABLET BY MOUTH TWICE A DAY AS NEEDED, Disp: 100 tablet, Rfl: 0   ivermectin (STROMECTOL) 3 MG TABS tablet, Take by mouth., Disp: , Rfl:    latanoprost (XALATAN) 0.005 % ophthalmic solution, SMARTSIG:In Eye(s), Disp: , Rfl:    losartan  (COZAAR ) 100 MG tablet, TAKE 1 TABLET BY MOUTH EVERY DAY, Disp: 90 tablet, Rfl: 3   Magnesium 250 MG TABS, Take by mouth., Disp: , Rfl:    nystatin  cream (MYCOSTATIN ), Apply 1 Application topically 2 (two) times daily. (Patient taking differently: Apply 1 Application topically as needed for dry skin.), Disp: 30 g, Rfl: 2   OVER THE COUNTER MEDICATION, Collagen  for Hair Strength, Disp: , Rfl:    oxyCODONE  (OXY IR/ROXICODONE ) 5 MG immediate release tablet, Take 1 tablet (5 mg total) by mouth every 4 (four) hours as needed for severe pain (pain score 7-10)., Disp: 5 tablet, Rfl: 0   permethrin  (ELIMITE ) 5 % cream, as needed (Used for scabies)., Disp: , Rfl:    polyethylene glycol (MIRALAX  / GLYCOLAX ) 17 g packet, Take 17 g by mouth daily., Disp: , Rfl:    polyethylene glycol powder (GLYCOLAX /MIRALAX ) 17 GM/SCOOP powder, Take 17 g by mouth daily. Drink 17g (1 scoop) dissolved in water per day., Disp: 255 g, Rfl: 0   psyllium (METAMUCIL) 58.6 % powder, Take 1 packet by mouth as needed (Only uses once or twice a month if needed)., Disp: , Rfl:    Objective There were no vitals filed for this visit.   Gen: NAD CV: S1 S2 RRR Lungs: Clear to auscultation bilaterally Abd: soft, nontender   Previous Pelvic Exam showed: POP-Q   3                                            Aa   5.5                                           Ba   5.5                                              C    7                                            Gh   4                                            Pb   7                                            tvl    3  Ap   5.5                                            Bp                                                  D         Assessment/ Plan  The patient is a 68 y.o. year old scheduled to undergo Exam under anesthesia, colpocleisis, levator plication, perineorrhpahy, cystoscopy.    [name], MD

## 2024-08-21 ENCOUNTER — Telehealth: Payer: Self-pay

## 2024-08-21 NOTE — Telephone Encounter (Signed)
 Left message on VM per DPR advising pt she will need an office visit to be evaluated before a medication can be sent in. Left office number. If she cannot make it to the office, she may need to go to an Urgent Care.

## 2024-08-21 NOTE — Telephone Encounter (Signed)
 Copied from CRM #[id number]. Topic: Clinical - Medical Advice >> Aug 21, 2024 12:03 PM Anairis L wrote: Reason for CRM: Patient is requesting medication for a possible yeast infection.  Would like it sent to [id number], [place] - [address] [place] [address] Phone: [phone] Fax: [phone]  She also said happy retiring.   Thank you.

## 2024-08-27 ENCOUNTER — Encounter (HOSPITAL_COMMUNITY): Payer: Self-pay | Admitting: Obstetrics and Gynecology

## 2024-08-29 ENCOUNTER — Ambulatory Visit: Payer: Medicare HMO | Admitting: Dermatology

## 2024-08-29 ENCOUNTER — Other Ambulatory Visit: Payer: Self-pay | Admitting: *Deleted

## 2024-08-29 MED ORDER — ALPRAZOLAM 0.5 MG PO TABS: ORAL_TABLET | ORAL | 0 refills | Status: AC

## 2024-08-29 NOTE — Telephone Encounter (Signed)
 Last filled 07-22-24 #60 Last OV 11-06-23 Next OV With Adina Crandall, NP 10-15-24 [facility]

## 2024-08-30 ENCOUNTER — Encounter (HOSPITAL_COMMUNITY): Payer: Self-pay | Admitting: Obstetrics and Gynecology

## 2024-08-30 NOTE — Progress Notes (Signed)
 Spoke w/ via phone for pre-op interview--- [name] needs dos---- BMP per Careers adviser.        Myers results------Current EKG in Epic dated 01/25/24. COVID test -----patient states asymptomatic no test needed Arrive at -------1100 NPO after MN NO Solid Food.  Clear liquids from MN until---1000 Pre-Surgery Ensure or G2:  Med rec completed Medications to take morning of surgery -----Norvasc  Diabetic medication -----  GLP1 agonist last dose: GLP1 instructions:  Patient instructed no nail polish to be worn day of surgery Patient instructed to bring photo id and insurance card day of surgery Patient aware to have Driver (ride ) / caregiver    for 24 hours after surgery - Son [name] Patient Special Instructions ----- Pre-Op special Instructions -----  Patient verbalized understanding of instructions that were given at this phone interview. Patient denies chest pain, sob, fever, cough at the interview.

## 2024-09-02 ENCOUNTER — Ambulatory Visit (HOSPITAL_BASED_OUTPATIENT_CLINIC_OR_DEPARTMENT_OTHER): Admitting: Anesthesiology

## 2024-09-02 ENCOUNTER — Encounter (HOSPITAL_COMMUNITY): Payer: Self-pay | Admitting: Obstetrics and Gynecology

## 2024-09-02 ENCOUNTER — Ambulatory Visit (HOSPITAL_COMMUNITY)
Admission: RE | Admit: 2024-09-02 | Discharge: 2024-09-02 | Disposition: A | Discharge status: Home / Self Care | Attending: Obstetrics and Gynecology | Admitting: Obstetrics and Gynecology

## 2024-09-02 ENCOUNTER — Encounter (HOSPITAL_COMMUNITY)
Admission: RE | Disposition: A | Discharge status: Home / Self Care | Payer: Self-pay | Source: Home / Self Care | Attending: Obstetrics and Gynecology

## 2024-09-02 ENCOUNTER — Other Ambulatory Visit: Payer: Self-pay

## 2024-09-02 ENCOUNTER — Ambulatory Visit (HOSPITAL_COMMUNITY): Admitting: Anesthesiology

## 2024-09-02 DIAGNOSIS — I10 Essential (primary) hypertension: Secondary | ICD-10-CM | POA: Insufficient documentation

## 2024-09-02 DIAGNOSIS — N993 Prolapse of vaginal vault after hysterectomy: Secondary | ICD-10-CM | POA: Diagnosis not present

## 2024-09-02 DIAGNOSIS — N819 Female genital prolapse, unspecified: Secondary | ICD-10-CM

## 2024-09-02 DIAGNOSIS — K219 Gastro-esophageal reflux disease without esophagitis: Secondary | ICD-10-CM | POA: Insufficient documentation

## 2024-09-02 DIAGNOSIS — Z79899 Other long term (current) drug therapy: Secondary | ICD-10-CM | POA: Insufficient documentation

## 2024-09-02 DIAGNOSIS — N816 Rectocele: Secondary | ICD-10-CM | POA: Insufficient documentation

## 2024-09-02 HISTORY — DX: Essential (primary) hypertension: I10

## 2024-09-02 HISTORY — DX: Prediabetes: R73.03

## 2024-09-02 LAB — BASIC METABOLIC PANEL WITH GFR
Anion gap: 9 (ref 5–15)
BUN: 11 mg/dL (ref 8–23)
CO2: 25 mmol/L (ref 22–32)
Calcium: 9.2 mg/dL (ref 8.9–10.3)
Chloride: 105 mmol/L (ref 98–111)
Creatinine, Ser: 0.73 mg/dL (ref 0.44–1.00)
GFR, Estimated: >60 mL/min (ref >60)
Glucose, Bld: 98 mg/dL (ref 70–99)
Potassium: 3.7 mmol/L (ref 3.5–5.1)
Sodium: 139 mmol/L (ref 135–145)

## 2024-09-02 SURGERY — COLPOCLEISIS
Anesthesia: General

## 2024-09-02 MED ORDER — ENOXAPARIN SODIUM 40 MG/0.4ML IJ SOSY
40.0000 mg | PREFILLED_SYRINGE | INTRAMUSCULAR | Status: AC
  Administered 2024-09-02: 40 mg via SUBCUTANEOUS

## 2024-09-02 MED ORDER — OXYCODONE HCL 5 MG/5ML PO SOLN: 5.0000 mg | Freq: Once | ORAL | Status: AC | PRN

## 2024-09-02 MED ORDER — ROCURONIUM BROMIDE 10 MG/ML (PF) SYRINGE
PREFILLED_SYRINGE | INTRAVENOUS | Status: AC
  Filled 2024-09-02: qty 10

## 2024-09-02 MED ORDER — 0.9 % SODIUM CHLORIDE (POUR BTL) OPTIME
TOPICAL | Status: DC | PRN
  Administered 2024-09-02: 1000 mL

## 2024-09-02 MED ORDER — KETAMINE HCL 50 MG/5ML IJ SOSY
PREFILLED_SYRINGE | INTRAMUSCULAR | Status: AC
  Filled 2024-09-02: qty 5

## 2024-09-02 MED ORDER — FENTANYL CITRATE (PF) 250 MCG/5ML IJ SOLN
INTRAMUSCULAR | Status: DC | PRN
  Administered 2024-09-02: 50 ug via INTRAVENOUS
  Administered 2024-09-02: 100 ug via INTRAVENOUS

## 2024-09-02 MED ORDER — LIDOCAINE 2% (20 MG/ML) 5 ML SYRINGE
INTRAMUSCULAR | Status: AC
  Filled 2024-09-02: qty 5

## 2024-09-02 MED ORDER — PROPOFOL 10 MG/ML IV BOLUS
INTRAVENOUS | Status: AC
  Filled 2024-09-02: qty 20

## 2024-09-02 MED ORDER — SUGAMMADEX SODIUM 200 MG/2ML IV SOLN
INTRAVENOUS | Status: DC | PRN
  Administered 2024-09-02: 200 mg via INTRAVENOUS

## 2024-09-02 MED ORDER — CHLORHEXIDINE GLUCONATE 0.12 % MT SOLN
15.0000 mL | Freq: Once | OROMUCOSAL | Status: AC
  Administered 2024-09-02: 15 mL via OROMUCOSAL

## 2024-09-02 MED ORDER — ACETAMINOPHEN 500 MG PO TABS
1000.0000 mg | ORAL_TABLET | ORAL | Status: AC
  Administered 2024-09-02: 1000 mg via ORAL

## 2024-09-02 MED ORDER — ONDANSETRON HCL 4 MG/2ML IJ SOLN
INTRAMUSCULAR | Status: AC
  Filled 2024-09-02: qty 2

## 2024-09-02 MED ORDER — LIDOCAINE 2% (20 MG/ML) 5 ML SYRINGE
INTRAMUSCULAR | Status: DC | PRN
  Administered 2024-09-02: 60 mg via INTRAVENOUS

## 2024-09-02 MED ORDER — DEXAMETHASONE SODIUM PHOSPHATE 10 MG/ML IJ SOLN
INTRAMUSCULAR | Status: DC | PRN
  Administered 2024-09-02: 10 mg via INTRAVENOUS

## 2024-09-02 MED ORDER — ENOXAPARIN SODIUM 40 MG/0.4ML IJ SOSY
PREFILLED_SYRINGE | INTRAMUSCULAR | Status: AC
  Filled 2024-09-02: qty 0.4

## 2024-09-02 MED ORDER — LIDOCAINE-EPINEPHRINE 1 %-1:100000 IJ SOLN
INTRAMUSCULAR | Status: DC | PRN
  Administered 2024-09-02: 49 mL

## 2024-09-02 MED ORDER — ONDANSETRON HCL 4 MG/2ML IJ SOLN
INTRAMUSCULAR | Status: DC | PRN
  Administered 2024-09-02: 4 mg via INTRAVENOUS

## 2024-09-02 MED ORDER — OXYCODONE HCL 5 MG PO TABS
ORAL_TABLET | ORAL | Status: AC
  Filled 2024-09-02: qty 1

## 2024-09-02 MED ORDER — AMISULPRIDE (ANTIEMETIC) 5 MG/2ML IV SOLN: 10.0000 mg | Freq: Once | INTRAVENOUS | Status: DC | PRN

## 2024-09-02 MED ORDER — PROPOFOL 10 MG/ML IV BOLUS
INTRAVENOUS | Status: DC | PRN
  Administered 2024-09-02: 120 mg via INTRAVENOUS

## 2024-09-02 MED ORDER — POVIDONE-IODINE 10 % EX SWAB: 2.0000 | Freq: Once | CUTANEOUS | Status: DC

## 2024-09-02 MED ORDER — KETAMINE HCL 50 MG/5ML IJ SOSY
PREFILLED_SYRINGE | INTRAMUSCULAR | Status: DC | PRN
Start: 2024-09-02 — End: 2024-09-02
  Administered 2024-09-02: 20 mg via INTRAVENOUS

## 2024-09-02 MED ORDER — CEFAZOLIN SODIUM-DEXTROSE 2-4 GM/100ML-% IV SOLN
INTRAVENOUS | Status: AC
  Filled 2024-09-02: qty 100

## 2024-09-02 MED ORDER — PHENAZOPYRIDINE HCL 100 MG PO TABS
200.0000 mg | ORAL_TABLET | ORAL | Status: AC
  Administered 2024-09-02: 200 mg via ORAL

## 2024-09-02 MED ORDER — PHENAZOPYRIDINE HCL 100 MG PO TABS
ORAL_TABLET | ORAL | Status: AC
  Filled 2024-09-02: qty 2

## 2024-09-02 MED ORDER — FENTANYL CITRATE (PF) 250 MCG/5ML IJ SOLN
INTRAMUSCULAR | Status: AC
  Filled 2024-09-02: qty 5

## 2024-09-02 MED ORDER — EPHEDRINE SULFATE-NACL 50-0.9 MG/10ML-% IV SOSY
PREFILLED_SYRINGE | INTRAVENOUS | Status: DC | PRN
  Administered 2024-09-02: 10 mg via INTRAVENOUS
  Administered 2024-09-02: 5 mg via INTRAVENOUS

## 2024-09-02 MED ORDER — CHLORHEXIDINE GLUCONATE 0.12 % MT SOLN
OROMUCOSAL | Status: AC
  Administered 2024-09-02: 15 mL
  Filled 2024-09-02: qty 15

## 2024-09-02 MED ORDER — CEFAZOLIN SODIUM-DEXTROSE 2-4 GM/100ML-% IV SOLN
2.0000 g | INTRAVENOUS | Status: AC
  Administered 2024-09-02: 2 g via INTRAVENOUS

## 2024-09-02 MED ORDER — SODIUM CHLORIDE 0.9 % IR SOLN
Status: DC | PRN
  Administered 2024-09-02: 1000 mL via INTRAVESICAL

## 2024-09-02 MED ORDER — OXYCODONE HCL 5 MG PO TABS
5.0000 mg | ORAL_TABLET | Freq: Once | ORAL | Status: AC | PRN
  Administered 2024-09-02: 5 mg via ORAL

## 2024-09-02 MED ORDER — DEXAMETHASONE SODIUM PHOSPHATE 10 MG/ML IJ SOLN
INTRAMUSCULAR | Status: AC
  Filled 2024-09-02: qty 1

## 2024-09-02 MED ORDER — PHENYLEPHRINE 80 MCG/ML (10ML) SYRINGE FOR IV PUSH (FOR BLOOD PRESSURE SUPPORT)
PREFILLED_SYRINGE | INTRAVENOUS | Status: DC | PRN
  Administered 2024-09-02 (×2): 80 ug via INTRAVENOUS

## 2024-09-02 MED ORDER — MIDAZOLAM HCL 2 MG/2ML IJ SOLN
INTRAMUSCULAR | Status: DC | PRN
  Administered 2024-09-02: 2 mg via INTRAVENOUS

## 2024-09-02 MED ORDER — FENTANYL CITRATE (PF) 100 MCG/2ML IJ SOLN: 25.0000 ug | INTRAMUSCULAR | Status: DC | PRN

## 2024-09-02 MED ORDER — MIDAZOLAM HCL 2 MG/2ML IJ SOLN
INTRAMUSCULAR | Status: AC
  Filled 2024-09-02: qty 2

## 2024-09-02 MED ORDER — ROCURONIUM BROMIDE 10 MG/ML (PF) SYRINGE
PREFILLED_SYRINGE | INTRAVENOUS | Status: DC | PRN
  Administered 2024-09-02: 50 mg via INTRAVENOUS

## 2024-09-02 MED ORDER — ORAL CARE MOUTH RINSE: 15.0000 mL | Freq: Once | OROMUCOSAL | Status: AC

## 2024-09-02 MED ORDER — ACETAMINOPHEN 500 MG PO TABS
ORAL_TABLET | ORAL | Status: AC
  Filled 2024-09-02: qty 2

## 2024-09-02 MED ORDER — LACTATED RINGERS IV SOLN: INTRAVENOUS | Status: DC

## 2024-09-02 SURGICAL SUPPLY — 28 items
BLADE CLIPPER SENSICLIP SURGIC (BLADE) IMPLANT
BLADE SURG 15 STRL LF DISP TIS (BLADE) ×2 IMPLANT
ELECTRODE REM PT RTRN 9FT ADLT (ELECTROSURGICAL) IMPLANT
GLOVE BIOGEL PI IND STRL 6.5 (GLOVE) ×2 IMPLANT
GLOVE BIOGEL PI IND STRL 7.0 (GLOVE) ×2 IMPLANT
GLOVE ECLIPSE 6.0 STRL STRAW (GLOVE) ×2 IMPLANT
GOWN STRL REUS W/ TWL LRG LVL3 (GOWN DISPOSABLE) ×2 IMPLANT
GOWN STRL REUS W/TWL LRG LVL3 (GOWN DISPOSABLE) ×2 IMPLANT
HIBICLENS CHG 4% 4OZ (MISCELLANEOUS) ×2 IMPLANT
HOLDER FOLEY CATH W/STRAP (MISCELLANEOUS) ×2 IMPLANT
KIT TURNOVER KIT B (KITS) ×2 IMPLANT
NDL HYPO 22X1.5 SAFETY MO (MISCELLANEOUS) ×2 IMPLANT
NEEDLE HYPO 22X1.5 SAFETY MO (MISCELLANEOUS) ×1 IMPLANT
NS IRRIG 1000ML POUR BTL (IV SOLUTION) ×2 IMPLANT
PACK VAGINAL WOMENS (CUSTOM PROCEDURE TRAY) ×2 IMPLANT
PAD OB MATERNITY 11 LF (PERSONAL CARE ITEMS) ×2 IMPLANT
RETRACTOR LONE STAR DISPOSABLE (INSTRUMENTS) ×2 IMPLANT
RETRACTOR STAY HOOK 5MM (MISCELLANEOUS) ×2 IMPLANT
SET IRRIG Y TYPE TUR BLADDER L (SET/KITS/TRAYS/PACK) IMPLANT
SLEEVE SCD COMPRESS KNEE MED (STOCKING) ×2 IMPLANT
SURGIFLO W/THROMBIN 8M KIT (HEMOSTASIS) IMPLANT
SUT VIC AB 0 CT1 27XBRD ANBCTR (SUTURE) ×2 IMPLANT
SUT VIC AB 0 CT1 27XCR 8 STRN (SUTURE) ×2 IMPLANT
SUT VIC AB 2-0 SH 27XBRD (SUTURE) ×2 IMPLANT
SUT VICRYL 2-0 SH 8X27 (SUTURE) ×2 IMPLANT
SYR BULB EAR ULCER 3OZ GRN STR (SYRINGE) ×2 IMPLANT
TOWEL GREEN STERILE (TOWEL DISPOSABLE) ×2 IMPLANT
TRAY FOLEY W/BAG SLVR 14FR LF (SET/KITS/TRAYS/PACK) ×2 IMPLANT

## 2024-09-02 NOTE — OR Nursing (Signed)
 Voiding trial begins. 300cc sterile water instilled into baldder via cathetor. Cathetor balloon deflated and cathetor removed.  Patient assisted to restroom, voided 300cc clear blush colored urine without difficulty.  Patient bladder scanned for 10cc.  Dr. Marilynne notified and orders received to discharge home without a cathetor.  Andree Kerns rn

## 2024-09-02 NOTE — Op Note (Signed)
 Operative Note  Preoperative Diagnosis: anterior vaginal prolapse, posterior vaginal prolapse, and vaginal vault prolapse after hysterectomy  Postoperative Diagnosis: same  Procedures performed:  Colpocleisis, levator plication, perineorrhaphy, cystoscopy  Implants: none  Attending Surgeon: Rosaline Caper, MD  Assistant: Jorene Moats, PA  Anesthesia: General endotracheal  Findings: 1. On vaginal exam, stage IV prolapse present  2. On cystoscopy, normal bladder and urethral mucosa without injury or lesion. Brisk bilateral ureteral efflux present.    Specimens: none  Estimated blood loss: 50 mL  IV fluids: see flowsheet  Urine output: 200 mL  Complications: none  Procedure in Detail:  After informed consent was obtained, the patient was taken to the operating room where she was placed under anesthesia.  She was then placed in the dorsal lithotomy position, taking care to avoid traction on the extremities. She was then prepped and draped in the usual sterile fashion.  A self-retaining lonestar retractor was placed using four elastic blue stays.  After a foley catheter was inserted into the urethra, the location of the midurethra was palpated. Two Allis clamps were placed at the vaginal apex. 1% lidocaine  with epinephrine  was injected into the vaginal mucosa circumferentially.  A marking pen was used to delineate 4 quadrants of vaginal mucosa to remove.  An incision was made with a 15 blade scalpel along these marked lines. After an Allis clamp was placed on the vaginal mucosa, the underlying vaginal muscularis was dissected off the mucosa.  The vaginal mucosa was excised off each quadrant. Excellent hemostasis was obtained with cautery.  Serial pursestring sutures were used to reduce the vaginal vault prolapse using 2-0 vicryl. Anterior and posterior plication sutures were placed to reapproximate the epithelium with 0-vicryl. The mucosa was then closed with a 0-vicryl suture in a  running fashion.    The Foley catheter was removed.  A 70-degree cystoscope was introduced, and 360-degree inspection revealed no trauma to the bladder, with bilateral ureteral efflux.  The bladder was drained and the cystoscope was removed.  The Foley catheter was reinserted.   Attention was then turned to the perineum.  Two Allis clamps were placed at the level of the hymenal ring and 1% lidocaine  with epinephrine  was injected into the vaginal mucosa. A diamond shaped incision was made over the perineum with a 15 blade scalpel, and the epithelium was removed.  The underlying tissue was then dissected off of the epithelium bilaterally with Metzenbaum scissors. The levator muscles were plicated to the midline with two interrupted 0-vicryl sutures.  A 0-Vicryl suture was used to reapproximate the perineal body. A 2-0 vicryl suture was then used to close the epithelium in a subcutaneous and subcuticular fashion.  The vagina was copiously irrigated.  Hemostasis was noted. The patient tolerated the procedure well.  She was awakened from anesthesia and transferred to the recovery room in stable condition. All counts were correct x 2.    [name], MD

## 2024-09-02 NOTE — Discharge Instructions (Addendum)

## 2024-09-02 NOTE — Anesthesia Procedure Notes (Signed)
 Procedure Name: Intubation Date/Time: 09/02/2024 2:08 PM  Performed by: Makael Stein C, CRNAPre-anesthesia Checklist: Patient identified, Emergency Drugs available, Suction available and Patient being monitored Patient Re-evaluated:Patient Re-evaluated prior to induction Oxygen Delivery Method: Circle system utilized Preoxygenation: Pre-oxygenation with 100% oxygen Induction Type: IV induction Ventilation: Mask ventilation without difficulty Laryngoscope Size: Mac and 3 Grade View: Grade I Nasal Tubes: Nasal prep performed and Nasal Rae Number of attempts: 1 Airway Equipment and Method: Stylet and Oral airway Placement Confirmation: ETT inserted through vocal cords under direct vision, positive ETCO2 and breath sounds checked- equal and bilateral Secured at: 21 cm Tube secured with: Tape Dental Injury: Teeth and Oropharynx as per pre-operative assessment

## 2024-09-02 NOTE — Transfer of Care (Signed)
 Immediate Anesthesia Transfer of Care Note  Patient: [name]  Procedure(s) Performed: COLPOCLEISIS CYSTOSCOPY PERINEOPLASTY  Patient Location: PACU  Anesthesia Type:General  Level of Consciousness: awake, alert , and oriented  Airway & Oxygen Therapy: Patient Spontanous Breathing and Patient connected to face mask oxygen  Post-op Assessment: Report given to RN and Post -op Vital signs reviewed and stable  Post vital signs: Reviewed and stable  Last Vitals:  Vitals Value Taken Time  BP    Temp    Pulse 92 09/02/24 16:01  Resp 17 09/02/24 16:01  SpO2 96 % 09/02/24 16:01  Vitals shown include unfiled device data.  Last Pain:  Vitals:   09/02/24 1100  TempSrc: Oral  PainSc: 0-No pain      Patients Stated Pain Goal: 5 (09/02/24 1100)  Complications: No notable events documented.

## 2024-09-02 NOTE — Anesthesia Preprocedure Evaluation (Addendum)
 Anesthesia Evaluation  Patient identified by MRN, date of birth, ID band Patient awake    Reviewed: Allergy & Precautions, NPO status , Patient's Chart, lab work & pertinent test results  Airway Mallampati: III  TM Distance: >3 FB Neck ROM: Full    Dental  (+) Missing, Dental Advisory Given   Pulmonary neg pulmonary ROS   Pulmonary exam normal breath sounds clear to auscultation       Cardiovascular hypertension, Pt. on medications Normal cardiovascular exam Rhythm:Regular Rate:Normal  TTE 2025 1. Left ventricular ejection fraction, by estimation, is 60 to 65%. The  left ventricle has normal function. The left ventricle has no regional  wall motion abnormalities. There is mild left ventricular hypertrophy.  Left ventricular diastolic parameters  are indeterminate.   2. Right ventricular systolic function is normal. The right ventricular  size is normal. There is mildly elevated pulmonary artery systolic  pressure. The estimated right ventricular systolic pressure is 36.7 mmHg.   3. The mitral valve is normal in structure. Trivial mitral valve  regurgitation. No evidence of mitral stenosis.   4. The aortic valve is tricuspid. Aortic valve regurgitation is not  visualized. No aortic stenosis is present.   5. The inferior vena cava is normal in size with <50% respiratory  variability, suggesting right atrial pressure of 8 mmHg.     Neuro/Psych  PSYCHIATRIC DISORDERS Anxiety Depression    negative neurological ROS     GI/Hepatic Neg liver ROS,GERD  ,,  Endo/Other  negative endocrine ROS    Renal/GU negative Renal ROS  negative genitourinary   Musculoskeletal  (+) Arthritis ,    Abdominal   Peds  Hematology negative hematology ROS (+)   Anesthesia Other Findings   Reproductive/Obstetrics                              Anesthesia Physical Anesthesia Plan  ASA: 2  Anesthesia Plan:  General   Post-op Pain Management: Tylenol  PO (pre-op)* and Ketamine  IV*   Induction: Intravenous  PONV Risk Score and Plan: 3 and Midazolam , Dexamethasone  and Ondansetron   Airway Management Planned: Oral ETT  Additional Equipment:   Intra-op Plan:   Post-operative Plan: Extubation in OR  Informed Consent: I have reviewed the patients History and Physical, chart, labs and discussed the procedure including the risks, benefits and alternatives for the proposed anesthesia with the patient or authorized representative who has indicated his/her understanding and acceptance.     Dental advisory given  Plan Discussed with: CRNA  Anesthesia Plan Comments:          Anesthesia Quick Evaluation

## 2024-09-02 NOTE — Interval H&P Note (Signed)
 History and Physical Interval Note:  09/02/2024 1:19 PM  [name]  has presented today for surgery, with the diagnosis of vag vault prolapse after hysterectomy,.  The various methods of treatment have been discussed with the patient and family. After consideration of risks, benefits and other options for treatment, the patient has consented to  Procedure(s) with comments: COLPOCLEISIS (N/A)  CYSTOSCOPY (N/A) PERINEOPLASTY (N/A) as a surgical intervention.  The patient's history has been reviewed, patient examined, no change in status, stable for surgery.  I have reviewed the patient's chart and labs.  Questions were answered to the patient's satisfaction.     [name]

## 2024-09-03 ENCOUNTER — Telehealth: Payer: Self-pay | Admitting: Obstetrics and Gynecology

## 2024-09-03 ENCOUNTER — Encounter (HOSPITAL_COMMUNITY): Payer: Self-pay | Admitting: Obstetrics and Gynecology

## 2024-09-03 NOTE — Telephone Encounter (Signed)
 [name] underwent Colpocleisis, levator plication, perineorrhaphy, cystoscopy on 09/02/24.   [name] passed [name] voiding trial.  was backfilled into the bladder Voided  PVR by bladder scan was 10ml.   [name] was discharged without a catheter. Please call [name] for a routine post op check. Thanks!  Sherry LOISE Caper, MD

## 2024-09-04 NOTE — Telephone Encounter (Signed)
 [name]  underwent Colpocleisis, Cystoscopy, and Perineoplasty  on 09/02/2024  with [x] Dr Marilynne [] Dr Guadlupe.  The patient reports that her pain is controlled.  She is taking [] No Medication [x] Acetaminophen  500mg  every 6 hours [x] Ibuprofen  600mg  every 6 hours or []  Prescribed Narcotic.  Her pain level is 4[x] with medication [] Without medication is 8.   She denies vaginal bleeding.  The patient is tolerating PO fluids and solids. She has not had a bowel movement and is taking Miralax  and has taken colace also for her bowel regimen. She is not not passing gas.  She was discharged without a catheter.   []  Discharged without a catheter, the patient does feel as if she is emptying her bladder.  She does not having any additional questions.  Reviewed Post operative instructions as needed to answer additional questions.   CC'd note to patient's provider.

## 2024-09-06 ENCOUNTER — Telehealth: Payer: Self-pay | Admitting: *Deleted

## 2024-09-06 NOTE — Telephone Encounter (Signed)
 TC from pt - DOB verified. Pt had surgery 9/15 no BM since surgery.  Pt taking Mirilax once a day since evening of surgery, then took colace - as much as directed on Wed, then took 4 Mirilax's yesterday with still no BM. Pt stated is drinking water and hot cider.  Has had 2 prunes today.  Not eating a lot of solid food, one small meal a day.  I spoke with Kaitlin, she advised 2 tablsepoons of milk of magnesia twice a day and if no BM after 2 days then use Magnesium Citrate as directed on bottle.  Pt understood directions and will call back or call MD on call if needed.  Sotero CMA

## 2024-09-07 NOTE — Anesthesia Postprocedure Evaluation (Signed)
 Anesthesia Post Note  Patient: [name]  Procedure(s) Performed: COLPOCLEISIS CYSTOSCOPY PERINEOPLASTY     Patient location during evaluation: PACU Anesthesia Type: General Level of consciousness: awake and alert Pain management: pain level controlled Vital Signs Assessment: post-procedure vital signs reviewed and stable Respiratory status: spontaneous breathing, nonlabored ventilation, respiratory function stable and patient connected to nasal cannula oxygen Cardiovascular status: blood pressure returned to baseline and stable Postop Assessment: no apparent nausea or vomiting Anesthetic complications: no   No notable events documented.  Last Vitals:  Vitals:   09/02/24 1630 09/02/24 1745  BP: (!) 123/58 (!) 124/58  Pulse: 81 71  Resp: 15 18  Temp:    SpO2: 93% 99%    Last Pain:  Vitals:   09/02/24 1745  TempSrc:   PainSc: 3    Pain Goal: Patients Stated Pain Goal: 5 (09/02/24 1100)                 Irish Piech L Arshia Rondon

## 2024-09-24 ENCOUNTER — Ambulatory Visit: Admitting: Dermatology

## 2024-09-24 DIAGNOSIS — L988 Other specified disorders of the skin and subcutaneous tissue: Secondary | ICD-10-CM

## 2024-09-24 DIAGNOSIS — L578 Other skin changes due to chronic exposure to nonionizing radiation: Secondary | ICD-10-CM | POA: Diagnosis not present

## 2024-09-24 DIAGNOSIS — Z1283 Encounter for screening for malignant neoplasm of skin: Secondary | ICD-10-CM | POA: Diagnosis not present

## 2024-09-24 DIAGNOSIS — W908XXA Exposure to other nonionizing radiation, initial encounter: Secondary | ICD-10-CM | POA: Diagnosis not present

## 2024-09-24 DIAGNOSIS — L720 Epidermal cyst: Secondary | ICD-10-CM

## 2024-09-24 DIAGNOSIS — L821 Other seborrheic keratosis: Secondary | ICD-10-CM

## 2024-09-24 DIAGNOSIS — D229 Melanocytic nevi, unspecified: Secondary | ICD-10-CM

## 2024-09-24 DIAGNOSIS — Z808 Family history of malignant neoplasm of other organs or systems: Secondary | ICD-10-CM

## 2024-09-24 DIAGNOSIS — L820 Inflamed seborrheic keratosis: Secondary | ICD-10-CM | POA: Diagnosis not present

## 2024-09-24 DIAGNOSIS — L729 Follicular cyst of the skin and subcutaneous tissue, unspecified: Secondary | ICD-10-CM

## 2024-09-24 DIAGNOSIS — D1801 Hemangioma of skin and subcutaneous tissue: Secondary | ICD-10-CM

## 2024-09-24 DIAGNOSIS — L814 Other melanin hyperpigmentation: Secondary | ICD-10-CM

## 2024-09-24 DIAGNOSIS — Z79899 Other long term (current) drug therapy: Secondary | ICD-10-CM

## 2024-09-24 DIAGNOSIS — Z7189 Other specified counseling: Secondary | ICD-10-CM

## 2024-09-24 MED ORDER — TRETINOIN 0.05 % EX CREA: TOPICAL_CREAM | Freq: Every day | CUTANEOUS | 11 refills | Status: AC

## 2024-09-24 NOTE — Patient Instructions (Addendum)

## 2024-09-24 NOTE — Progress Notes (Unsigned)
   Follow-Up Visit   Subjective  [name] is a 68 y.o. female who presents for the following: Skin Cancer Screening and Full Body Skin Exam The patient presents for Total-Body Skin Exam (TBSE) for skin cancer screening and mole check. The patient has spots, moles and lesions to be evaluated, some may be new or changing and the patient may have concern these could be cancer.  The following portions of the chart were reviewed this encounter and updated as appropriate: medications, allergies, medical history  Review of Systems:  No other skin or systemic complaints except as noted in HPI or Assessment and Plan.  Objective  Well appearing patient in no apparent distress; mood and affect are within normal limits.  A full examination was performed including scalp, head, eyes, ears, nose, lips, neck, chest, axillae, abdomen, back, buttocks, bilateral upper extremities, bilateral lower extremities, hands, feet, fingers, toes, fingernails, and toenails. All findings within normal limits unless otherwise noted below.   Relevant physical exam findings are noted in the Assessment and Plan.  trunk, arms, face x 17 (17) Stuck-on, waxy, tan-brown papules and plaques -- Discussed benign etiology and prognosis.   Assessment & Plan   SKIN CANCER SCREENING PERFORMED TODAY.  ACTINIC DAMAGE - Chronic condition, secondary to cumulative UV/sun exposure - diffuse scaly erythematous macules with underlying dyspigmentation - Recommend daily broad spectrum sunscreen SPF 30+ to sun-exposed areas, reapply every 2 hours as needed.  - Staying in the shade or wearing long sleeves, sun glasses (UVA+UVB protection) and wide brim hats (4-inch brim around the entire circumference of the hat) are also recommended for sun protection.  - Call for new or changing lesions.  LENTIGINES, SEBORRHEIC KERATOSES, HEMANGIOMAS - Benign normal skin lesions - Benign-appearing - Call for any changes  MELANOCYTIC NEVI -  Tan-brown and/or pink-flesh-colored symmetric macules and papules - Benign appearing on exam today - Observation - Call clinic for new or changing moles - Recommend daily use of broad spectrum spf 30+ sunscreen to sun-exposed areas.   Milia/Facial ELASTOSIS  - tiny firm white papules - type of cyst - benign - sometimes these will clear with nightly OTC adapalene/Differin 0.1% gel or retinol. - may be extracted if symptomatic - observe - Continue Tretinoin  0.05% cream spot treat aa's QHS.   FAMILY HISTORY OF SKIN CANCER What type(s):Melanoma Who affected: sister    INFLAMED SEBORRHEIC KERATOSIS (17) trunk, arms, face x 17 (17) Destruction of lesion - trunk, arms, face x 17 (17) Complexity: simple   Destruction method: cryotherapy   Informed consent: discussed and consent obtained   Timeout:  patient name, date of birth, surgical site, and procedure verified Lesion destroyed using liquid nitrogen: Yes   Region frozen until ice ball extended beyond lesion: Yes   Outcome: patient tolerated procedure well with no complications   Post-procedure details: wound care instructions given      Return in about 1 year (around 09/24/2025) for TBSE, ISKs.  IFay Kirks, CMA, am acting as scribe for Alm Rhyme, MD .   Documentation: I have reviewed the above documentation for accuracy and completeness, and I agree with the above.  Alm Rhyme, MD

## 2024-09-25 ENCOUNTER — Encounter: Payer: Self-pay | Admitting: Dermatology

## 2024-09-25 ENCOUNTER — Other Ambulatory Visit: Payer: Self-pay | Admitting: Family

## 2024-09-26 ENCOUNTER — Ambulatory Visit (INDEPENDENT_AMBULATORY_CARE_PROVIDER_SITE_OTHER)

## 2024-09-26 VITALS — BP 124/68 | Ht 65.0 in | Wt 190.0 lb

## 2024-09-26 DIAGNOSIS — Z0000 Encounter for general adult medical examination without abnormal findings: Secondary | ICD-10-CM | POA: Diagnosis not present

## 2024-09-26 NOTE — Progress Notes (Signed)
 Because this visit was a virtual/telehealth visit,  certain criteria was not obtained, such a blood pressure, CBG if applicable, and timed get up and go. Any medications not marked as taking were not mentioned during the medication reconciliation part of the visit. Any vitals not documented were not able to be obtained due to this being a telehealth visit or patient was unable to self-report a recent blood pressure reading due to a lack of equipment at home via telehealth. Vitals that have been documented are verbally provided by the patient.   This visit was performed by a medical professional under my direct supervision. I was immediately available for consultation/collaboration. I have reviewed and agree with the Annual Wellness Visit documentation.  Subjective:   [name] is a 68 y.o. who presents for a Medicare Wellness preventive visit.  As a reminder, Annual Wellness Visits don't include a physical exam, and some assessments may be limited, especially if this visit is performed virtually. We may recommend an in-person follow-up visit with your provider if needed.  Visit Complete: Virtual I connected with  [name] on 09/26/24 by a audio enabled telemedicine application and verified that I am speaking with the correct person using two identifiers.  Patient Location: Home  Provider Location: Home Office  I discussed the limitations of evaluation and management by telemedicine. The patient expressed understanding and agreed to proceed.  Vital Signs: Because this visit was a virtual/telehealth visit, some criteria may be missing or patient reported. Any vitals not documented were not able to be obtained and vitals that have been documented are patient reported.  VideoDeclined- This patient declined Librarian, academic. Therefore the visit was completed with audio only.  Persons Participating in Visit: Patient.  AWV Questionnaire: No: Patient Medicare  AWV questionnaire was not completed prior to this visit.  Cardiac Risk Factors include: advanced age (>61men, >61 women);hypertension;obesity (BMI >30kg/m2)     Objective:    Today's Vitals   09/26/24 1340  BP: 124/68  Weight: 190 lb (86.2 kg)  Height: 5' 5 (1.651 m)   Body mass index is 31.62 kg/m.     09/02/2024   11:21 AM  Advanced Directives  Does Patient Have a Medical Advance Directive? No  Would patient like information on creating a medical advance directive? No - Guardian declined    Current Medications (verified) Outpatient Encounter Medications as of 09/26/2024  Medication Sig   acetaminophen  (TYLENOL ) 500 MG tablet Take 1 tablet (500 mg total) by mouth every 6 (six) hours as needed (pain).   ALPRAZolam  (XANAX ) 0.5 MG tablet TAKE 1/2 TO 1 TABLET BY MOUTH TWICE A DAY AS NEEDED FOR ANXIETY   amLODipine  (NORVASC ) 5 MG tablet Take 1 tablet (5 mg total) by mouth daily.   Calcium  Carb-Cholecalciferol 600-200 MG-UNIT TABS Take 3 tablets by mouth daily.   chlorhexidine  (PERIDEX ) 0.12 % solution Use as directed 5 mLs in the mouth or throat 2 (two) times daily.   estradiol  (ESTRACE ) 0.1 MG/GM vaginal cream Place 0.5 g vaginally 2 (two) times a week.   estradiol  (ESTRACE ) 0.5 MG tablet TAKE 1 TABLET BY MOUTH EVERY DAY   hydrochlorothiazide  (HYDRODIURIL ) 25 MG tablet TAKE 1 TABLET (25 MG TOTAL) BY MOUTH DAILY.   ibuprofen  (ADVIL ) 600 MG tablet Take 1 tablet (600 mg total) by mouth every 6 (six) hours as needed.   ibuprofen  (ADVIL ) 800 MG tablet TAKE 1 TABLET BY MOUTH TWICE A DAY AS NEEDED   latanoprost (XALATAN) 0.005 %  ophthalmic solution SMARTSIG:In Eye(s)   losartan  (COZAAR ) 100 MG tablet TAKE 1 TABLET BY MOUTH EVERY DAY   nystatin  cream (MYCOSTATIN ) Apply 1 Application topically 2 (two) times daily. (Patient taking differently: Apply 1 Application topically as needed for dry skin.)   OVER THE COUNTER MEDICATION Collagen for Hair Strength   oxyCODONE  (OXY IR/ROXICODONE ) 5 MG  immediate release tablet Take 1 tablet (5 mg total) by mouth every 4 (four) hours as needed for severe pain (pain score 7-10).   permethrin  (ELIMITE ) 5 % cream as needed (Used for scabies).   polyethylene glycol (MIRALAX  / GLYCOLAX ) 17 g packet Take 17 g by mouth daily.   polyethylene glycol powder (GLYCOLAX /MIRALAX ) 17 GM/SCOOP powder Take 17 g by mouth daily. Drink 17g (1 scoop) dissolved in water per day.   psyllium (METAMUCIL) 58.6 % powder Take 1 packet by mouth as needed (Only uses once or twice a month if needed).   tretinoin  (RETIN-A ) 0.05 % cream Apply topically at bedtime.   No facility-administered encounter medications on file as of 09/26/2024.    Allergies (verified) Patient has no known allergies.   History: Past Medical History:  Diagnosis Date   Allergic rhinitis due to pollen    Anxiety    Depression    GERD (gastroesophageal reflux disease)    Hypertension    Pre-diabetes    Past Surgical History:  Procedure Laterality Date   ABDOMINAL HYSTERECTOMY  08/1998   BSO fibroids   CHOLECYSTECTOMY  8/12   COLON SURGERY     COLPOCLEISIS N/A 09/02/2024   Procedure: COLPOCLEISIS;  Surgeon: Marilynne Rosaline SAILOR, MD;  Location: [facility] OR;  Service: Gynecology;  Laterality: N/A;  Total time 1 hour 45 mins   CYSTOSCOPY N/A 09/02/2024   Procedure: CYSTOSCOPY;  Surgeon: Marilynne Rosaline SAILOR, MD;  Location: [facility] OR;  Service: Gynecology;  Laterality: N/A;   HERNIA REPAIR  11/2003   RIH (ingram)   PERINEOPLASTY N/A 09/02/2024   Procedure: PERINEOPLASTY;  Surgeon: Marilynne Rosaline SAILOR, MD;  Location: [facility] OR;  Service: Gynecology;  Laterality: N/A;   Family History  Problem Relation Age of Onset   Heart disease Mother        Heart attack   Stroke Father    Cancer Paternal Aunt    Heart disease Brother 76       recent MI   Heart attack Brother    Heart attack Brother    Heart attack Half-Sister    Colon polyps Neg Hx    Stomach cancer Neg Hx    Breast cancer Neg Hx    BRCA 1/2  Neg Hx    Social History   Socioeconomic History   Marital status: Married    Spouse name: Not on file   Number of children: 2   Years of education: Not on file   Highest education level: Not on file  Occupational History   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    Comment: Retired  Tobacco Use   Smoking status: Never    Passive exposure: Past   Smokeless tobacco: Never  Vaping Use   Vaping status: Never Used  Substance and Sexual Activity   Alcohol use: No   Drug use: No   Sexual activity: Not Currently    Birth control/protection: Surgical  Other Topics Concern   Not on file  Social History Narrative   No formal advanced directives   Son [name] should be health care POA   Would accept resuscitation attempts but no prolonged ventilation or tube  feedings   Social Drivers of Corporate investment banker Strain: Low Risk  (09/26/2024)   Overall Financial Resource Strain (CARDIA)    Difficulty of Paying Living Expenses: Not hard at all  Food Insecurity: No Food Insecurity (09/26/2024)   Hunger Vital Sign    Worried About Running Out of Food in the Last Year: Never true    Ran Out of Food in the Last Year: Never true  Transportation Needs: No Transportation Needs (09/26/2024)   PRAPARE - Administrator, Civil Service (Medical): No    Lack of Transportation (Non-Medical): No  Physical Activity: Insufficiently Active (09/26/2024)   Exercise Vital Sign    Days of Exercise per Week: 7 days    Minutes of Exercise per Session: 20 min  Stress: No Stress Concern Present (09/26/2024)   [employer] of Occupational Health - Occupational Stress Questionnaire    Feeling of Stress: Not at all  Social Connections: Moderately Integrated (09/26/2024)   Social Connection and Isolation Panel    Frequency of Communication with Friends and Family: More than three times a week    Frequency of Social Gatherings with Friends and Family: More than three times a week    Attends  Religious Services: More than 4 times per year    Active Member of [employer] or Organizations: Yes    Attends Engineer, structural: More than 4 times per year    Marital Status: Separated    Tobacco Counseling Counseling given: Not Answered    Clinical Intake:  Pre-visit preparation completed: Yes  Pain : No/denies pain     BMI - recorded: 31.62 Nutritional Status: BMI > 30  Obese Nutritional Risks: None Diabetes: No  No results found for: HGBA1C   How often do you need to have someone help you when you read instructions, pamphlets, or other written materials from your doctor or pharmacy?: 1 - Never  Interpreter Needed?: No  Information entered by :: Asmara Backs [email or web address]   Activities of Daily Living     09/26/2024    1:43 PM 09/02/2024   11:27 AM  In your present state of health, do you have any difficulty performing the following activities:  Hearing? 0 1  Comment  (R) ear  Vision? 0 0  Difficulty concentrating or making decisions? 0 0  Walking or climbing stairs? 0   Dressing or bathing? 0   Doing errands, shopping? 0   Preparing Food and eating ? N   Using the Toilet? N   In the past six months, have you accidently leaked urine? N   Do you have problems with loss of bowel control? N   Managing your Medications? N   Managing your Finances? N   Housekeeping or managing your Housekeeping? N     Patient Care Team: Jimmy Charlie FERNS, MD as PCP - General Verlin Lonni BIRCH, MD as PCP - Cardiology (Cardiology)  I have updated your Care Teams any recent Medical Services you may have received from other providers in the past year.     Assessment:   This is a routine wellness examination for [facility].  Hearing/Vision screen Hearing Screening - Comments:: No hearing difficukties Vision Screening - Comments:: Pt wears glasses   Goals Addressed             This Visit's Progress    Patient Stated       Patient states she would like to complete fall  cleaning       Depression  Screen     09/26/2024    1:44 PM 10/13/2023    9:58 AM 10/13/2023    9:32 AM 10/07/2022   10:00 AM 09/07/2021    9:49 AM 06/19/2018    9:45 AM  PHQ 2/9 Scores  PHQ - 2 Score 0 0 0 1 0 0  PHQ- 9 Score 0         Fall Risk     09/26/2024    1:42 PM 10/13/2023    9:58 AM 10/13/2023    9:32 AM 10/07/2022   10:00 AM 09/07/2021    9:49 AM  Fall Risk   Falls in the past year? 0 0 0 0 0  Number falls in past yr: 0  0    Injury with Fall? 0  0    Risk for fall due to : No Fall Risks  No Fall Risks    Follow up Falls evaluation completed  Falls evaluation completed      MEDICARE RISK AT HOME:  Medicare Risk at Home Any stairs in or around the home?: No If so, are there any without handrails?: No Home free of loose throw rugs in walkways, pet beds, electrical cords, etc?: Yes Adequate lighting in your home to reduce risk of falls?: Yes Life alert?: No Use of a cane, walker or w/c?: No Grab bars in the bathroom?: Yes Shower chair or bench in shower?: Yes Elevated toilet seat or a handicapped toilet?: Yes  TIMED UP AND GO:  Was the test performed?  No  Cognitive Function: 6CIT completed        09/26/2024    1:45 PM  6CIT Screen  What Year? 0 points  What month? 0 points  What time? 0 points  Count back from 20 0 points  Months in reverse 0 points  Repeat phrase 0 points  Total Score 0 points    Immunizations Immunization History  Administered Date(s) Administered   Fluad Quad(high Dose 65+) 10/07/2022   Influenza,inj,Quad PF,6+ Mos 10/09/2015, 09/23/2016, 09/27/2018, 09/19/2019, 09/02/2020, 09/07/2021   Influenza-Unspecified 08/29/2024   PFIZER(Purple Top)SARS-COV-2 Vaccination 04/03/2020, 04/27/2020   Pneumococcal Conjugate-13 12/30/2020   Pneumococcal Polysaccharide-23 09/07/2021   Td 11/09/2004   Tdap 05/21/2014   Zoster Recombinant(Shingrix) 07/30/2019, 10/30/2019    Screening Tests Health Maintenance  Topic Date Due    Colonoscopy  10/27/2016   DTaP/Tdap/Td (3 - Td or Tdap) 05/21/2024   Medicare Annual Wellness (AWV)  09/26/2025   Mammogram  06/10/2026   Pneumococcal Vaccine: 50+ Years  Completed   Influenza Vaccine  Completed   DEXA SCAN  Completed   Hepatitis C Screening  Completed   Zoster Vaccines- Shingrix  Completed   Meningococcal B Vaccine  Aged Out   COVID-19 Vaccine  Discontinued    Health Maintenance Items Addressed:patient declined   Additional Screening:  Vision Screening: Recommended annual ophthalmology exams for early detection of glaucoma and other disorders of the eye. Is the patient up to date with their annual eye exam?  No  Who is the provider or what is the name of the office in which the patient attends annual eye exams?   Dental Screening: Recommended annual dental exams for proper oral hygiene  Community Resource Referral / Chronic Care Management: CRR required this visit?  No   CCM required this visit?  No   Plan:    I have personally reviewed and noted the following in the patient's chart:   Medical and social history Use of alcohol, tobacco or illicit  drugs  Current medications and supplements including opioid prescriptions. Patient is not currently taking opioid prescriptions. Functional ability and status Nutritional status Physical activity Advanced directives List of other physicians Hospitalizations, surgeries, and ER visits in previous 12 months Vitals Screenings to include cognitive, depression, and falls Referrals and appointments  In addition, I have reviewed and discussed with patient certain preventive protocols, quality metrics, and best practice recommendations. A written personalized care plan for preventive services as well as general preventive health recommendations were provided to patient.   Lyle MARLA Right, [place]   09/26/2024   After Visit Summary: (MyChart) Due to this being a telephonic visit, the after visit summary with patients  personalized plan was offered to patient via MyChart   Notes: Nothing significant to report at this time.

## 2024-09-26 NOTE — Patient Instructions (Signed)
 [name],  Thank you for taking the time for your Medicare Wellness Visit. I appreciate your continued commitment to your health goals. Please review the care plan we discussed, and feel free to reach out if I can assist you further.  Medicare recommends these wellness visits once per year to help you and your care team stay ahead of potential health issues. These visits are designed to focus on prevention, allowing your provider to concentrate on managing your acute and chronic conditions during your regular appointments.  Please note that Annual Wellness Visits do not include a physical exam. Some assessments may be limited, especially if the visit was conducted virtually. If needed, we may recommend a separate in-person follow-up with your provider.  Ongoing Care Seeing your primary care provider every 3 to 6 months helps us  monitor your health and provide consistent, personalized care.   Referrals If a referral was made during today's visit and you haven't received any updates within two weeks, please contact the referred provider directly to check on the status.  Recommended Screenings:  Health Maintenance  Topic Date Due   Colon Cancer Screening  10/27/2016   DTaP/Tdap/Td vaccine (3 - Td or Tdap) 05/21/2024   Medicare Annual Wellness Visit  09/26/2025   Breast Cancer Screening  06/10/2026   Pneumococcal Vaccine for age over 28  Completed   Flu Shot  Completed   DEXA scan (bone density measurement)  Completed   Hepatitis C Screening  Completed   Zoster (Shingles) Vaccine  Completed   Meningitis B Vaccine  Aged Out   COVID-19 Vaccine  Discontinued       09/02/2024   11:21 AM  Advanced Directives  Does Patient Have a Medical Advance Directive? No  Would patient like information on creating a medical advance directive? No - Guardian declined   Advance Care Planning is important because it: Ensures you receive medical care that aligns with your values, goals, and  preferences. Provides guidance to your family and loved ones, reducing the emotional burden of decision-making during critical moments.  Vision: Annual vision screenings are recommended for early detection of glaucoma, cataracts, and diabetic retinopathy. These exams can also reveal signs of chronic conditions such as diabetes and high blood pressure.  Dental: Annual dental screenings help detect early signs of oral cancer, gum disease, and other conditions linked to overall health, including heart disease and diabetes.  Please see the attached documents for additional preventive care recommendations.

## 2024-09-27 ENCOUNTER — Encounter: Payer: Self-pay | Admitting: Pharmacist

## 2024-09-27 NOTE — Progress Notes (Signed)
 Pharmacy Quality Measure Review  This patient is appearing on a report for being at risk of failing the adherence measure for cholesterol (statin) medications this calendar year.   Medication: atorvastatin  10 mg Last fill date: 04/30/24 for 30 day supply  Pharmacy billed rx incorrectly... Dispensed 40 tablets for 30 days supply. Written 3 tabs/week, this is a 93 day supply.. Incorrect day supply kicked Rx off of auto fill.   Contacted pharmacy to facilitate refills.

## 2024-10-11 ENCOUNTER — Encounter: Payer: Self-pay | Admitting: Obstetrics and Gynecology

## 2024-10-11 ENCOUNTER — Ambulatory Visit (INDEPENDENT_AMBULATORY_CARE_PROVIDER_SITE_OTHER): Admitting: Obstetrics and Gynecology

## 2024-10-11 VITALS — BP 119/76 | HR 77

## 2024-10-11 DIAGNOSIS — Z9889 Other specified postprocedural states: Secondary | ICD-10-CM

## 2024-10-11 DIAGNOSIS — Z48816 Encounter for surgical aftercare following surgery on the genitourinary system: Secondary | ICD-10-CM

## 2024-10-11 NOTE — Progress Notes (Signed)
 [facility]  Date of Visit: 10/11/2024  History of Present Illness: Ms. Rutten is a 68 y.o. female scheduled today for a post-operative visit.   Surgery: s/p Colpocleisis, levator plication, perineorrhaphy, cystoscopy on 09/02/24  She passed her postoperative void trial.   Postoperative course has been uncomplicated.   Today she reports she is having some occasional leakage throughout the day, small amounts. Had a lot of discomfort the first few weeks after surgery but it has improved. She stopped drinking after 8pm so she only wakes up 2 times per night to urinate.   UTI in the last 6 weeks? No  Pain? No  She has returned to her normal activity (except for postop restrictions) Vaginal bulge? No  Stress incontinence: Yes  Urgency/frequency: No  Urge incontinence: No  Voiding dysfunction: No  Bowel issues: No , has been taking metamucil  Subjective Success: Do you usually have a bulge or something falling out that you can see or feel in the vaginal area? No  Retreatment Success: Any retreatment with surgery or pessary for any compartment? No    Medications: She has a current medication list which includes the following prescription(s): acetaminophen , alprazolam , amlodipine , calcium  carb-cholecalciferol, chlorhexidine , estradiol , estradiol , hydrochlorothiazide , ibuprofen , ibuprofen , latanoprost, losartan , nystatin  cream, OVER THE COUNTER MEDICATION, oxycodone , permethrin , polyethylene glycol, polyethylene glycol powder, psyllium, and tretinoin .   Allergies: Patient has no known allergies.   Physical Exam: BP 119/76   Pulse 77    Pelvic Examination: perineal incision with sutures and slight separation. Vagina: Incisions healing well. Sutures are present at incision line and there is not granulation tissue. No tenderness along the anterior or posterior vagina. No apical tenderness. No pelvic masses.   POP-Q: POP-Q  -3                                            Aa    -3                                           Ba  -3.5                                              C   3                                            Gh  7                                            Pb  4                                            tvl   -3  Ap  -3                                            Bp                                                 D    ---------------------------------------------------------  Assessment and Plan:  1. Post-operative state     - Pt previously was sent estrogen cream but did not pick it up. Advised her to get it from the pharmacy and use as directed to promote healing. Will reexamine in a month.  - Can resume regular activity including exercise. Discussed avoidance of heavy lifting and straining long term to reduce the risk of recurrence.  - Discussed options for SUI symptoms including pelvic PT, urethral bulking or a sling. She would prefer to wait more time before deciding on any treatment.    Return in about 1 month (around 11/11/2024).  [name], MD

## 2024-10-11 NOTE — Patient Instructions (Signed)
 Use estradiol  cream at the sutures at the vaginal opening every night for two weeks then decrease to twice a week.

## 2024-10-15 ENCOUNTER — Encounter: Payer: Self-pay | Admitting: Nurse Practitioner

## 2024-10-15 ENCOUNTER — Ambulatory Visit: Admitting: Nurse Practitioner

## 2024-10-15 VITALS — BP 118/60 | HR 72 | Temp 98.6°F | Ht 65.0 in | Wt 196.0 lb

## 2024-10-15 DIAGNOSIS — I10 Essential (primary) hypertension: Secondary | ICD-10-CM | POA: Diagnosis not present

## 2024-10-15 DIAGNOSIS — N951 Menopausal and female climacteric states: Secondary | ICD-10-CM

## 2024-10-15 DIAGNOSIS — Z8639 Personal history of other endocrine, nutritional and metabolic disease: Secondary | ICD-10-CM | POA: Diagnosis not present

## 2024-10-15 DIAGNOSIS — Z87898 Personal history of other specified conditions: Secondary | ICD-10-CM | POA: Diagnosis not present

## 2024-10-15 DIAGNOSIS — Z0000 Encounter for general adult medical examination without abnormal findings: Secondary | ICD-10-CM

## 2024-10-15 LAB — CBC WITH DIFFERENTIAL/PLATELET
Basophils Absolute: 0 K/uL (ref 0.0–0.1)
Basophils Relative: 0.5 % (ref 0.0–3.0)
Eosinophils Absolute: 0.1 K/uL (ref 0.0–0.7)
Eosinophils Relative: 2.1 % (ref 0.0–5.0)
HCT: 42 % (ref 36.0–46.0)
Hemoglobin: 14.1 g/dL (ref 12.0–15.0)
Lymphocytes Relative: 25.6 % (ref 12.0–46.0)
Lymphs Abs: 1.4 K/uL (ref 0.7–4.0)
MCHC: 33.4 g/dL (ref 30.0–36.0)
MCV: 98.2 fl (ref 78.0–100.0)
Monocytes Absolute: 0.5 K/uL (ref 0.1–1.0)
Monocytes Relative: 9.2 % (ref 3.0–12.0)
Neutro Abs: 3.4 K/uL (ref 1.4–7.7)
Neutrophils Relative %: 62.6 % (ref 43.0–77.0)
Platelets: 286 K/uL (ref 150.0–400.0)
RBC: 4.28 Mil/uL (ref 3.87–5.11)
RDW: 12.7 % (ref 11.5–15.5)
WBC: 5.5 K/uL (ref 4.0–10.5)

## 2024-10-15 LAB — LIPID PANEL
Cholesterol: 137 mg/dL (ref 0–200)
HDL: 43.9 mg/dL (ref >39.00)
LDL Cholesterol: 62 mg/dL (ref 0–99)
NonHDL: 93.46
Total CHOL/HDL Ratio: 3
Triglycerides: 155 mg/dL — ABNORMAL HIGH (ref 0.0–149.0)
VLDL: 31 mg/dL (ref 0.0–40.0)

## 2024-10-15 LAB — COMPREHENSIVE METABOLIC PANEL WITH GFR
ALT: 10 U/L (ref 0–35)
AST: 13 U/L (ref 0–37)
Albumin: 4 g/dL (ref 3.5–5.2)
Alkaline Phosphatase: 103 U/L (ref 39–117)
BUN: 10 mg/dL (ref 6–23)
CO2: 31 meq/L (ref 19–32)
Calcium: 8.7 mg/dL (ref 8.4–10.5)
Chloride: 103 meq/L (ref 96–112)
Creatinine, Ser: 0.68 mg/dL (ref 0.40–1.20)
GFR: 89.25 mL/min (ref >60.00)
Glucose, Bld: 90 mg/dL (ref 70–99)
Potassium: 3.8 meq/L (ref 3.5–5.1)
Sodium: 139 meq/L (ref 135–145)
Total Bilirubin: 0.4 mg/dL (ref 0.2–1.2)
Total Protein: 6.6 g/dL (ref 6.0–8.3)

## 2024-10-15 LAB — TSH: TSH: 1.81 u[IU]/mL (ref 0.35–5.50)

## 2024-10-15 LAB — HEMOGLOBIN A1C: Hgb A1c MFr Bld: 5.5 % (ref 4.6–6.5)

## 2024-10-15 NOTE — Assessment & Plan Note (Signed)
 Currently maintained on estradiol .  Patient is taking 0.5 mg thrice weekly currently she is working on weaning himself off

## 2024-10-15 NOTE — Progress Notes (Signed)
 Established Patient Office Visit  Subjective   Patient ID: [name], female    DOB: 10/28/1956  Age: 68 y.o. MRN: [id number]  Chief Complaint  Patient presents with   Transitions Of Care    HPI  HTN: Patient currently maintained on amlodipine  5 mg daily, hydrochlorothiazide  25 mg, losartan  daily. States that she shas a cuff if she needs it   VMS: Currently maintained on estradiol  0.5 mg three times a week. She is trying to wean herself off slowly.  Glaucoma: Patient currently maintained on Xalatan eyedrops. Dr. Estelle every 6 month  Anxiety with panic attacks: Patient used to take alprazolam  but has recently weaned herself off.  States she is no longer experiencing anxiety but will have panic attacks that can last up to 30 minutes.  Patient is been working through those without pharmacological intervention.  for complete physical and follow up of chronic conditions.  Immunizations: -Tetanus: Completed in 2015 -Influenza[facility]  -Shingles: Completed Shingrix series -Pneumonia: Completed 2022  Diet: Fair diet. 3 meals a day and sometimes she will snack. She is tryig to cut back and doing healthier versions for snack. Black tea in the am. Water through the day  Exercise: No regular exercise. Recentl surgery. She will get back to walking when cleared.   Eye exam: Completes annually g;asses  Dental exam: Completes semi-annually    Colonoscopy: Completed in 10/28/2011, overdue.  Did do a fecal occult in 10/17/2023 that was negative. Lung Cancer Screening: NA   Pap smear: Status post hysterectomy  Mammogram: 06/10/2024 that was negative.  Repeat 1 year  DEXA: 02/03/2022, that was normal.  Patient due for repeat scan. Does not want to pursue currently   Sleep: goes to bed around 10 and get yp around 6-7. Feels rested and does not snore  Advance directive: Son [name] is HPOA      Review of Systems  Constitutional:  Negative for chills and fever.  Respiratory:   Negative for shortness of breath.   Cardiovascular:  Negative for chest pain and leg swelling.  Gastrointestinal:  Negative for abdominal pain, blood in stool, constipation, diarrhea, nausea and vomiting.       BM daily   Genitourinary:  Negative for dysuria and hematuria.  Neurological:  Negative for dizziness, tingling and headaches.  Psychiatric/Behavioral:  Negative for hallucinations and suicidal ideas.       Objective:     BP 118/60   Pulse 72   Temp 98.6 F (37 C) (Oral)   Ht 5' 5 (1.651 m)   Wt 196 lb (88.9 kg)   SpO2 96%   BMI 32.62 kg/m  BP Readings from Last 3 Encounters:  10/15/24 118/60  10/11/24 119/76  09/26/24 124/68   Wt Readings from Last 3 Encounters:  10/15/24 196 lb (88.9 kg)  09/26/24 190 lb (86.2 kg)  09/02/24 190 lb (86.2 kg)   SpO2 Readings from Last 3 Encounters:  10/15/24 96%  09/02/24 99%  01/25/24 98%      Physical Exam Vitals and nursing note reviewed.  Constitutional:      Appearance: Normal appearance.  HENT:     Right Ear: Tympanic membrane, ear canal and external ear normal.     Left Ear: Tympanic membrane, ear canal and external ear normal.     Mouth/Throat:     Mouth: Mucous membranes are moist.     Pharynx: Oropharynx is clear.  Eyes:     Extraocular Movements: Extraocular movements intact.  Pupils: Pupils are equal, round, and reactive to light.  Cardiovascular:     Rate and Rhythm: Normal rate and regular rhythm.     Pulses: Normal pulses.     Heart sounds: Normal heart sounds.  Pulmonary:     Effort: Pulmonary effort is normal.     Breath sounds: Normal breath sounds.  Abdominal:     General: Bowel sounds are normal. There is no distension.     Palpations: There is no mass.     Tenderness: There is no abdominal tenderness.     Hernia: No hernia is present.  Musculoskeletal:     Right lower leg: No edema.     Left lower leg: No edema.  Lymphadenopathy:     Cervical: No cervical adenopathy.  Skin:     General: Skin is warm.  Neurological:     General: No focal deficit present.     Mental Status: She is alert.     Deep Tendon Reflexes:     Reflex Scores:      Bicep reflexes are 2+ on the right side and 2+ on the left side.      Patellar reflexes are 2+ on the right side and 2+ on the left side.    Comments: Bilateral upper and lower extremity strength 5/5  Psychiatric:        Mood and Affect: Mood normal.        Behavior: Behavior normal.        Thought Content: Thought content normal.        Judgment: Judgment normal.      No results found for any visits on 10/15/24.    The 10-year ASCVD risk score (Arnett DK, et al., 2019) is: 7.9%    Assessment & Plan:   Problem List Items Addressed This Visit       Cardiovascular and Mediastinum   Essential hypertension, benign   Patient currently maintained on amlodipine  5 mg daily, hydrochlorothiazide  25 mg daily, losartan  100 mg daily.  Patient's blood pressure controlled.  Continue medication as prescribed.      Relevant Orders   CBC with Differential/Platelet   Comprehensive metabolic panel with GFR   TSH   Lipid panel     Endocrine   History of prediabetes   History provided by patient pending A1c today      Relevant Orders   Hemoglobin A1c   Lipid panel     Other   MENOPAUSAL SYNDROME   Currently maintained on estradiol .  Patient is taking 0.5 mg thrice weekly currently she is working on weaning himself off      Routine general medical examination at a health care facility - Primary   Discussed age-appropriate immunizations and screening exams.  Did review patient's personal, surgical, social, family history.  Patient is up-to-date on all age-appropriate vaccinations she would like.  Patient did iFOB through her insurance company that she reports was negative.  Does not want to pursue colonoscopy at this juncture.  Patient up-to-date on breast cancer screening.  Patient does not want to pursue bone density scan at  this juncture.  Patient was given information at discharge about preventative healthcare maintenance with anticipatory guidance.      Relevant Orders   CBC with Differential/Platelet   Comprehensive metabolic panel with GFR   TSH    Return in about 1 year (around 10/15/2025) for CPE and Labs.    Adina Crandall, NP

## 2024-10-15 NOTE — Patient Instructions (Signed)
 Nice to see you today I will be in touch with the labs once I have them Follow up with me in 1 year, sooner if you need me

## 2024-10-15 NOTE — Assessment & Plan Note (Signed)
 History provided by patient pending A1c today

## 2024-10-15 NOTE — Assessment & Plan Note (Signed)
 Patient currently maintained on amlodipine  5 mg daily, hydrochlorothiazide  25 mg daily, losartan  100 mg daily.  Patient's blood pressure controlled.  Continue medication as prescribed.

## 2024-10-15 NOTE — Assessment & Plan Note (Signed)
 Discussed age-appropriate immunizations and screening exams.  Did review patient's personal, surgical, social, family history.  Patient is up-to-date on all age-appropriate vaccinations she would like.  Patient did iFOB through her insurance company that she reports was negative.  Does not want to pursue colonoscopy at this juncture.  Patient up-to-date on breast cancer screening.  Patient does not want to pursue bone density scan at this juncture.  Patient was given information at discharge about preventative healthcare maintenance with anticipatory guidance.

## 2024-10-16 ENCOUNTER — Ambulatory Visit: Payer: Self-pay | Admitting: Nurse Practitioner

## 2024-10-16 NOTE — Telephone Encounter (Signed)
 Mailed letter to pt' address on file/.

## 2024-10-21 ENCOUNTER — Ambulatory Visit: Payer: Self-pay

## 2024-10-21 NOTE — Telephone Encounter (Signed)
 Looks like we mailed a letter. Looks like they did review the labs with her. Also did she want to transfer to Dr. Avelina?

## 2024-10-21 NOTE — Telephone Encounter (Signed)
 Spoke with patient.  Labs were read and pt would like to transfer to Dr. Avelina.

## 2024-10-21 NOTE — Telephone Encounter (Signed)
 Pt. Given lab results. Asking to transfer care to Dr. Avelina. Please advise pt.    Copied from CRM #[id number]. Topic: Clinical - Lab/Test Results >> Oct 21, 2024 12:01 PM [name] wrote: Reason for CRM: Patient called to review lab results. She would like them mailed to her. She has questions about pre-diabetes range. Reason for Disposition  [1] Other NON-URGENT information for PCP AND [2] does not require PCP response  Answer Assessment - Initial Assessment Questions 1. REASON FOR CALL or QUESTION: What is your reason for calling today? or How can I best     Pt. Asking to review labs. 2. CALLER: Document the source of call. (e.g., laboratory staff, caregiver or patient).     Pt.  Protocols used: PCP Call - No Triage-A-AH

## 2024-10-29 ENCOUNTER — Other Ambulatory Visit: Payer: Self-pay

## 2024-10-29 MED ORDER — LOSARTAN POTASSIUM 100 MG PO TABS: 100.0000 mg | ORAL_TABLET | Freq: Every day | ORAL | 3 refills | Status: AC

## 2024-10-29 MED ORDER — AMLODIPINE BESYLATE 5 MG PO TABS: 5.0000 mg | ORAL_TABLET | Freq: Every day | ORAL | 3 refills | Status: AC

## 2024-11-04 ENCOUNTER — Telehealth: Payer: Self-pay | Admitting: Nurse Practitioner

## 2024-11-04 NOTE — Telephone Encounter (Signed)
 Pt called I reschedule  appt toc with Bedsole I put pt on hold due to a pt standing in front of me talking I was trying to  tell pt I was on phone I put mrs Urey on hold and when I tried to pick back up I answer another call and mrs Sick was gone I called mrs Degan and told her I was sorry for the confusing and schedule appt for her husband

## 2024-11-04 NOTE — Telephone Encounter (Signed)
 Called and schedule pt for toc

## 2024-11-04 NOTE — Telephone Encounter (Signed)
 Per 09/20/2024 encounter,  [name] was requesting TOC to Dr. Avelina.

## 2024-11-11 ENCOUNTER — Ambulatory Visit (INDEPENDENT_AMBULATORY_CARE_PROVIDER_SITE_OTHER): Admitting: Obstetrics and Gynecology

## 2024-11-11 ENCOUNTER — Encounter: Payer: Self-pay | Admitting: Obstetrics and Gynecology

## 2024-11-11 VITALS — BP 112/69 | HR 80

## 2024-11-11 DIAGNOSIS — Z48816 Encounter for surgical aftercare following surgery on the genitourinary system: Secondary | ICD-10-CM

## 2024-11-11 DIAGNOSIS — Z9889 Other specified postprocedural states: Secondary | ICD-10-CM

## 2024-11-11 NOTE — Progress Notes (Signed)
 [facility]  Date of Visit: 11/11/2024  History of Present Illness: [name] is a 68 y.o. female scheduled today for a post-operative visit.   Surgery: s/p Colpocleisis, levator plication, perineorrhaphy, cystoscopy on 09/02/24   Has been using the vaginal estrogen cream twice a week. Perineal area feels a lot better. She denies any vaginal bleeding.   Urinary leakage has improved. She has a little dribbling after emptying her bladder but she does not feel this is bothersome enough for treatment.    Medications: She has a current medication list which includes the following prescription(s): amlodipine , calcium  carb-cholecalciferol, chlorhexidine , estradiol , estradiol , hydrochlorothiazide , ibuprofen , ibuprofen , latanoprost, losartan , permethrin , polyethylene glycol, psyllium, tretinoin , acetaminophen , and alprazolam .   Allergies: Patient is allergic to oxycodone .   Physical Exam: BP 112/69   Pulse 80    Pelvic Examination: perineal incision well healed.  Vagina: Incisions healing well. Sutures are not present at incision line and there is not granulation tissue.   ---------------------------------------------------------  Assessment and Plan:  1. Post-operative state      - Continue vaginal estrogen cream twice a week for atrophy if desired.  - Can resume regular activity including exercise.  - She does not desire any treatment at this time for rare leakage.   Return as needed   [name], MD

## 2024-12-24 ENCOUNTER — Ambulatory Visit

## 2024-12-24 DIAGNOSIS — R21 Rash and other nonspecific skin eruption: Secondary | ICD-10-CM | POA: Diagnosis not present

## 2024-12-24 DIAGNOSIS — L820 Inflamed seborrheic keratosis: Secondary | ICD-10-CM | POA: Diagnosis not present

## 2024-12-24 DIAGNOSIS — L0291 Cutaneous abscess, unspecified: Secondary | ICD-10-CM

## 2024-12-24 MED ORDER — TRIAMCINOLONE ACETONIDE 0.1 % EX OINT: TOPICAL_OINTMENT | CUTANEOUS | 0 refills | Status: AC

## 2024-12-24 MED ORDER — MUPIROCIN 2 % EX OINT: TOPICAL_OINTMENT | CUTANEOUS | 0 refills | Status: AC

## 2024-12-24 NOTE — Patient Instructions (Signed)
 Topical steroids (such as triamcinolone , fluocinolone, fluocinonide, mometasone, clobetasol, halobetasol, betamethasone , hydrocortisone) can cause thinning and lightening of the skin if they are used for too long in the same area. Your physician has selected the right strength medicine for your problem and area affected on the body. Please use your medication only as directed by your physician to prevent side effects.   Doxycycline should be taken with food to prevent nausea. Do not lay down for 30 minutes after taking. Be cautious with sun exposure and use good sun protection while on this medication. Pregnant women should not take this medication.    Due to recent changes in healthcare laws, you may see results of your pathology and/or laboratory studies on MyChart before the doctors have had a chance to review them. We understand that in some cases there may be results that are confusing or concerning to you. Please understand that not all results are received at the same time and often the doctors may need to interpret multiple results in order to provide you with the best plan of care or course of treatment. Therefore, we ask that you please give us  2 business days to thoroughly review all your results before contacting the office for clarification. Should we see a critical lab result, you will be contacted sooner.   If You Need Anything After Your Visit  If you have any questions or concerns for your doctor, please call our main line at [phone] and press option 4 to reach your doctor's medical assistant. If no one answers, please leave a voicemail as directed and we will return your call as soon as possible. Messages left after 4 pm will be answered the following business day.   You may also send us  a message via MyChart. We typically respond to MyChart messages within 1-2 business days.  For prescription refills, please ask your pharmacy to contact our office. Our fax number is  [phone].  If you have an urgent issue when the clinic is closed that cannot wait until the next business day, you can page your doctor at the number below.    Please note that while we do our best to be available for urgent issues outside of office hours, we are not available 24/7.   If you have an urgent issue and are unable to reach us , you may choose to seek medical care at your doctor's office, retail clinic, urgent care center, or emergency room.  If you have a medical emergency, please immediately call 911 or go to the emergency department.  Pager Numbers  - Dr. Hester: [phone]  - Dr. Jackquline: [phone]  - Dr. Claudene: [phone]   - Dr. Raymund: [phone]  In the event of inclement weather, please call our main line at [phone] for an update on the status of any delays or closures.  Dermatology Medication Tips: Please keep the boxes that topical medications come in in order to help keep track of the instructions about where and how to use these. Pharmacies typically print the medication instructions only on the boxes and not directly on the medication tubes.   If your medication is too expensive, please contact our office at [phone] option 4 or send us  a message through MyChart.   We are unable to tell what your co-pay for medications will be in advance as this is different depending on your insurance coverage. However, we may be able to find a substitute medication at lower cost or fill out paperwork to get insurance  to cover a needed medication.   If a prior authorization is required to get your medication covered by your insurance company, please allow us  1-2 business days to complete this process.  Drug prices often vary depending on where the prescription is filled and some pharmacies may offer cheaper prices.  The website www.goodrx.com contains coupons for medications through different pharmacies. The prices here do not account for what the cost  may be with help from insurance (it may be cheaper with your insurance), but the website can give you the price if you did not use any insurance.  - You can print the associated coupon and take it with your prescription to the pharmacy.  - You may also stop by our office during regular business hours and pick up a GoodRx coupon card.  - If you need your prescription sent electronically to a different pharmacy, notify our office through [facility] [address] or by phone at [phone] option 4.     Si Usted Necesita Algo Despus de Su Visita  Tambin puede enviarnos un mensaje a travs de Clinical Cytogeneticist. Por lo general respondemos a los mensajes de MyChart en el transcurso de 1 a 2 das hbiles.  Para renovar recetas, por favor pida a su farmacia que se ponga en contacto con nuestra oficina. Randi lakes de fax es [place] [phone].  Si tiene un asunto urgente cuando la clnica est cerrada y que no puede esperar hasta el siguiente da hbil, puede llamar/localizar a su doctor(a) al nmero que aparece a continuacin.   Por favor, tenga en cuenta que aunque hacemos todo lo posible para estar disponibles para asuntos urgentes fuera del horario de [place], no estamos disponibles las 24 horas del da, los 7 [address]  [phone] de la [place].   Si tiene un problema urgente y no puede comunicarse con nosotros, puede optar por buscar atencin mdica  en el consultorio de su doctor(a), en una clnica privada, en un centro de atencin urgente o en una sala de emergencias.  Si tiene engineer, drilling, por favor llame inmediatamente al 911 o vaya a la sala de emergencias.  Nmeros de bper  - Dr. Hester: [phone]  - Dra. Jackquline: [phone]  - Dr. Claudene: [phone]  - Dra. Kitts: [phone]  En caso de inclemencias del [place], por favor llame a nuestra lnea principal al [phone] para una actualizacin sobre el estado de cualquier retraso o cierre.  Consejos para la medicacin en dermatologa: Por  favor, guarde las cajas en las que vienen los medicamentos de uso tpico para ayudarle a seguir las instrucciones sobre dnde y cmo usarlos. Las farmacias generalmente imprimen las instrucciones del medicamento slo en las cajas y no directamente en los tubos del [place].   Si su medicamento es muy caro, por favor, pngase en contacto con landry rieger llamando al [phone] y presione la opcin 4 o envenos un mensaje a travs de Clinical Cytogeneticist.   No podemos decirle cul ser su copago por los medicamentos por adelantado ya que esto es diferente dependiendo de la cobertura de su seguro. Sin embargo, es posible que podamos encontrar un medicamento sustituto a audiological scientist un formulario para que el seguro cubra el medicamento que se considera necesario.   Si se requiere una autorizacin previa para que su compaa de seguros cubra su medicamento, por favor permtanos de 1 a 2 das hbiles para completar este proceso.  Los precios de los medicamentos varan con frecuencia dependiendo del environmental consultant de dnde se surte la receta y  alguna farmacias pueden ofrecer precios ms baratos.  El sitio web www.goodrx.com tiene cupones para medicamentos de health and safety inspector. Los precios aqu no tienen en cuenta lo que podra costar con la ayuda del seguro (puede ser ms barato con su seguro), pero el sitio web puede darle el precio si no utiliz tourist information centre manager.  - Puede imprimir el cupn correspondiente y llevarlo con su receta a la farmacia.  - Tambin puede pasar por nuestra oficina durante el horario de atencin regular y education officer, museum una tarjeta de cupones de GoodRx.  - Si necesita que su receta se enve electrnicamente a una farmacia diferente, informe a nuestra oficina a travs de MyChart de [place] o por telfono llamando al [phone] y presione la opcin 4.

## 2024-12-24 NOTE — Progress Notes (Signed)
 "   Subjective   [name] is a 69 y.o. female who presents for the following: Rash. Patient is established patient   Today patient reports: Rash started on 12/18/24 as white circular bumps, itchy, pt currently taking OTC Benadryl and Cortisone cream. She was working outside prior to rash, she does have indoor/outdoor pets, no one else in the home has the rash, hx of scabies in the past, but last infection was over a year ago.  Spot on abdomen, tender and painful s/p benign bx in November   Irritated spot on abdomen   Review of Systems:    No other skin or systemic complaints except as noted in HPI or Assessment and Plan.  The following portions of the chart were reviewed this encounter and updated as appropriate: medications, allergies, medical history  Relevant Medical History:  n/a   Objective  (SKPE) Well appearing patient in no apparent distress; mood and affect are within normal limits. Examination was performed of the: Focused Exam of: the face, abdomen, arms, and legs   Examination notable for: Erythematous papules, plaques on chest and upper arms. Some scaly, some more indurated  iSK R lower abdomen Erythematous tender plaque L lower abdomen  Examination limited by: n/a   abdomen Erythematous stuck-on, waxy papule or plaque  Assessment & Plan  (SKAP)   Pruritic erythematous eruption of chest - DDX - ACD, grovers  - low c/f scabies. Some lesions faintly annular/indurated - considered GA, lupus  - Undiagnosed new problem with uncertain prognosis  - Differential diagnosis, treatment options, prognosis, risk/ benefit, and side effects of treatment were discussed with the patient.  - Start TMC 0.1% ointment to aa BID PRN. Topical steroids (such as triamcinolone , fluocinolone, fluocinonide, mometasone, clobetasol, halobetasol, betamethasone , hydrocortisone) can cause thinning and lightening of the skin if they are used for too long in the same area. Your physician has  selected the right strength medicine for your problem and area affected on the body. Please use your medication only as directed by your physician to prevent side effects.  - If not improving RTC for biopsy.  Wound - pt states from bx performed by Dr. Hurst in [place], [place] Start Doxycycline 100 mg po BID x 10 days. Doxycycline should be taken with food to prevent nausea. Do not lay down for 30 minutes after taking. Be cautious with sun exposure and use good sun protection while on this medication. Pregnant women should not take this medication.   Start Mupirocin  2% ointment apply to aa and cover with bandage.  Anaerobic and aerobic culture swab performed today.  Was sun protection counseling provided?: No    Level of service outlined above   Procedures, orders, diagnosis for this visit:  INFLAMED SEBORRHEIC KERATOSIS abdomen Symptomatic, irritating, patient would like treated.  - Destruction of lesion - abdomen Complexity: simple   Destruction method: cryotherapy   Informed consent: discussed and consent obtained   Timeout:  patient name, date of birth, surgical site, and procedure verified Lesion destroyed using liquid nitrogen: Yes   Region frozen until ice ball extended beyond lesion: Yes   Cryo cycles: 1 or 2. Outcome: patient tolerated procedure well with no complications   Post-procedure details: wound care instructions given    RASH AND OTHER NONSPECIFIC SKIN ERUPTION   ABSCESS   This Visit - Anaerobic and Aerobic Culture  Abscess -     Anaerobic and Aerobic Culture  Inflamed seborrheic keratosis -     Destruction of lesion  Rash and other nonspecific skin eruption  Other orders -     Triamcinolone  Acetonide; Apply 1 gram twice daily to affected areas of rash on skin. Stop once resolved and restart as needed for flares. Avoid use on face, armpits, groin unless otherwise indicated.  Dispense: 80 g; Refill: 0 -     Mupirocin ; Apply to healing wound and cover  with bandage daily until healed.  Dispense: 22 g; Refill: 0    Return to clinic: No follow-ups on file.  [name], CMA, am acting as scribe for Lauraine JAYSON Kanaris, MD .  Documentation: I have reviewed the above documentation for accuracy and completeness, and I agree with the above.  Lauraine JAYSON Kanaris, MD  "

## 2024-12-29 LAB — ANAEROBIC AND AEROBIC CULTURE

## 2024-12-30 ENCOUNTER — Ambulatory Visit: Payer: Self-pay

## 2024-12-30 NOTE — Progress Notes (Signed)
 Patient informed and voiced good understanding. Patient states that she never received the Doxycycline  so she  never started it; she does state that the lesion has improved.

## 2025-01-02 ENCOUNTER — Encounter

## 2025-01-02 ENCOUNTER — Other Ambulatory Visit: Payer: Self-pay

## 2025-01-02 MED ORDER — DOXYCYCLINE MONOHYDRATE 100 MG PO CAPS: ORAL_CAPSULE | ORAL | 0 refills | Status: DC

## 2025-01-02 MED ORDER — DOXYCYCLINE MONOHYDRATE 100 MG PO CAPS: ORAL_CAPSULE | ORAL | 0 refills | Status: AC

## 2025-01-07 ENCOUNTER — Ambulatory Visit: Admitting: Family Medicine

## 2025-01-10 ENCOUNTER — Ambulatory Visit: Admitting: Family Medicine

## 2025-01-10 ENCOUNTER — Encounter: Payer: Self-pay | Admitting: Family Medicine

## 2025-01-10 VITALS — BP 120/74 | HR 77 | Temp 97.7°F | Ht 65.0 in | Wt 198.1 lb

## 2025-01-10 DIAGNOSIS — N816 Rectocele: Secondary | ICD-10-CM

## 2025-01-10 DIAGNOSIS — N993 Prolapse of vaginal vault after hysterectomy: Secondary | ICD-10-CM | POA: Diagnosis not present

## 2025-01-10 DIAGNOSIS — Z8249 Family history of ischemic heart disease and other diseases of the circulatory system: Secondary | ICD-10-CM | POA: Diagnosis not present

## 2025-01-10 DIAGNOSIS — M180 Bilateral primary osteoarthritis of first carpometacarpal joints: Secondary | ICD-10-CM

## 2025-01-10 DIAGNOSIS — N951 Menopausal and female climacteric states: Secondary | ICD-10-CM

## 2025-01-10 DIAGNOSIS — Z8659 Personal history of other mental and behavioral disorders: Secondary | ICD-10-CM

## 2025-01-10 DIAGNOSIS — I10 Essential (primary) hypertension: Secondary | ICD-10-CM | POA: Diagnosis not present

## 2025-01-10 DIAGNOSIS — Z87898 Personal history of other specified conditions: Secondary | ICD-10-CM

## 2025-01-10 MED ORDER — CHLORHEXIDINE GLUCONATE 0.12 % MT SOLN: 5.0000 mL | Freq: Two times a day (BID) | OROMUCOSAL | 5 refills | Status: AC

## 2025-01-10 MED ORDER — IBUPROFEN 600 MG PO TABS: 600.0000 mg | ORAL_TABLET | Freq: Four times a day (QID) | ORAL | 0 refills | Status: AC | PRN

## 2025-01-10 NOTE — Assessment & Plan Note (Signed)
Resolved with lifestyle changes.

## 2025-01-10 NOTE — Assessment & Plan Note (Signed)
 Followed by cardiology( Dr. Rosario) for family history of early CAD. ECHO 2/205 EF 60-65%, trivial MR  06/2024 coronary CT 45%

## 2025-01-10 NOTE — Assessment & Plan Note (Signed)
"   Chronic, minimal issue when using OTC cream "

## 2025-01-10 NOTE — Assessment & Plan Note (Signed)
"   Resolved. No longer needing alprazolam . "

## 2025-01-10 NOTE — Assessment & Plan Note (Signed)
"   Well controlled hot flashes on current medication dose estradiol   0.5 mg daily "

## 2025-01-10 NOTE — Progress Notes (Signed)
 "   Patient ID: [name], female    DOB: [birth year], 69 y.o.   MRN: [id number]  This visit was conducted in person.  BP 120/74   Pulse 77   Temp 97.7 F (36.5 C) (Temporal)   Ht 5' 5 (1.651 m)   Wt 198 lb 2 oz (89.9 kg)   SpO2 98%   BMI 32.97 kg/m    CC:  Chief Complaint  Patient presents with   Establish Care    TOC from [facility]    Subjective:   HPI: [name] is a 69 y.o. female presenting on 01/10/2025 for Establish Care (TOC from [facility])  Previous PCP: Adina Crandall, NP/ Jimmy MD Last CPX:  10/15/2024 AMW: 09/26/2024  GYN:  Dr. Marilynne S/P rectocele surgery and hysterectomy      Hypertension: Well-controlled on amlodipine  5 mg daily and losartan  100 mg daily BP Readings from Last 3 Encounters:  01/10/25 120/74  11/11/24 112/69  10/15/24 118/60  Using medication without problems or lightheadedness:  none Chest pain with exertion: none Edema:none Short of breath: none Average home BPs: 120/70s Other issues:   History of prediabetes  Lab Results  Component Value Date   HGBA1C 5.5 10/15/2024   Menopausal syndrome..  on estradiol  0.5  mg daily .SABRA Controls synmptoms.   Followed by cardiology( Dr. Rosario) for family history of early CAD. ECHO 2/205 EF 60-65%, trivial MR  06/2024 coronary CT 45%  Lab Results  Component Value Date   CHOL 137 10/15/2024   HDL 43.90 10/15/2024   LDLCALC 62 10/15/2024   LDLDIRECT 97.0 10/07/2022   TRIG 155.0 (H) 10/15/2024   CHOLHDL 3 10/15/2024       Relevant past medical, surgical, family and social history reviewed and updated as indicated. Interim medical history since our last visit reviewed. Allergies and medications reviewed and updated. Outpatient Medications Prior to Visit  Medication Sig Dispense Refill   ALPRAZolam  (XANAX ) 0.5 MG tablet TAKE 1/2 TO 1 TABLET BY MOUTH TWICE A DAY AS NEEDED FOR ANXIETY 60 tablet 0   amLODipine  (NORVASC ) 5 MG tablet Take 1 tablet (5 mg total) by mouth daily. 90  tablet 3   Calcium  Carb-Cholecalciferol 600-200 MG-UNIT TABS Take 3 tablets by mouth daily.     cyanocobalamin (VITAMIN B12) 1000 MCG tablet Take 1,000 mcg by mouth daily.     doxycycline  (MONODOX ) 100 MG capsule Take 1 cap by mouth twice daily for 10 days Take with food and drink 20 capsule 0   estradiol  (ESTRACE ) 0.5 MG tablet TAKE 1 TABLET BY MOUTH EVERY DAY 90 tablet 3   latanoprost (XALATAN) 0.005 % ophthalmic solution SMARTSIG:In Eye(s)     losartan  (COZAAR ) 100 MG tablet Take 1 tablet (100 mg total) by mouth daily. 90 tablet 3   Magnesium 250 MG TABS Take 1 tablet by mouth daily.     mupirocin  ointment (BACTROBAN ) 2 % Apply to healing wound and cover with bandage daily until healed. 22 g 0   polyethylene glycol (MIRALAX  / GLYCOLAX ) 17 g packet Take 17 g by mouth daily.     psyllium (METAMUCIL) 58.6 % powder Take 1 packet by mouth as needed (Only uses once or twice a month if needed).     tretinoin  (RETIN-A ) 0.05 % cream Apply topically at bedtime. 45 g 11   triamcinolone  ointment (KENALOG ) 0.1 % Apply 1 gram twice daily to affected areas of rash on skin. Stop once resolved and restart as needed for flares. Avoid  use on face, armpits, groin unless otherwise indicated. 80 g 0   chlorhexidine  (PERIDEX ) 0.12 % solution Use as directed 5 mLs in the mouth or throat 2 (two) times daily. 473 mL 5   ibuprofen  (ADVIL ) 600 MG tablet Take 1 tablet (600 mg total) by mouth every 6 (six) hours as needed. 30 tablet 0   permethrin  (ELIMITE ) 5 % cream as needed (Used for scabies).     acetaminophen  (TYLENOL ) 500 MG tablet Take 1 tablet (500 mg total) by mouth every 6 (six) hours as needed (pain). 30 tablet 0   estradiol  (ESTRACE ) 0.1 MG/GM vaginal cream Place 0.5 g vaginally 2 (two) times a week. (Patient not taking: Reported on 12/24/2024) 42.5 g 11   hydrochlorothiazide  (HYDRODIURIL ) 25 MG tablet TAKE 1 TABLET (25 MG TOTAL) BY MOUTH DAILY. (Patient not taking: Reported on 01/10/2025) 90 tablet 3   ibuprofen   (ADVIL ) 800 MG tablet TAKE 1 TABLET BY MOUTH TWICE A DAY AS NEEDED (Patient not taking: Reported on 12/24/2024) 100 tablet 0   magnesium 30 MG tablet Take 30 mg by mouth 2 (two) times daily. (Patient taking differently: Take 30 mg by mouth 2 (two) times daily. Pt states that she thinks she takes 250 mg po daily.)     vitamin B-12 (CYANOCOBALAMIN) 100 MCG tablet Take 100 mcg by mouth daily.     No facility-administered medications prior to visit.     Per HPI unless specifically indicated in ROS section below Review of Systems  Constitutional:  Negative for fatigue and fever.  HENT:  Negative for congestion.   Eyes:  Negative for pain.  Respiratory:  Negative for cough and shortness of breath.   Cardiovascular:  Negative for chest pain, palpitations and leg swelling.  Gastrointestinal:  Negative for abdominal pain.  Genitourinary:  Negative for dysuria and vaginal bleeding.  Musculoskeletal:  Negative for back pain.  Neurological:  Negative for syncope, light-headedness and headaches.  Psychiatric/Behavioral:  Negative for dysphoric mood.    Objective:  BP 120/74   Pulse 77   Temp 97.7 F (36.5 C) (Temporal)   Ht 5' 5 (1.651 m)   Wt 198 lb 2 oz (89.9 kg)   SpO2 98%   BMI 32.97 kg/m   Wt Readings from Last 3 Encounters:  01/10/25 198 lb 2 oz (89.9 kg)  10/15/24 196 lb (88.9 kg)  09/26/24 190 lb (86.2 kg)      Physical Exam Constitutional:      General: She is not in acute distress.    Appearance: Normal appearance. She is well-developed. She is not ill-appearing or toxic-appearing.  HENT:     Head: Normocephalic.     Right Ear: Hearing, tympanic membrane, ear canal and external ear normal. Tympanic membrane is not erythematous, retracted or bulging.     Left Ear: Hearing, tympanic membrane, ear canal and external ear normal. Tympanic membrane is not erythematous, retracted or bulging.     Nose: No mucosal edema or rhinorrhea.     Right Sinus: No maxillary sinus tenderness or  frontal sinus tenderness.     Left Sinus: No maxillary sinus tenderness or frontal sinus tenderness.     Mouth/Throat:     Pharynx: Uvula midline.  Eyes:     General: Lids are normal. Lids are everted, no foreign bodies appreciated.     Conjunctiva/sclera: Conjunctivae normal.     Pupils: Pupils are equal, round, and reactive to light.  Neck:     Thyroid : No thyroid  mass or thyromegaly.  Vascular: No carotid bruit.     Trachea: Trachea normal.  Cardiovascular:     Rate and Rhythm: Normal rate and regular rhythm.     Pulses: Normal pulses.     Heart sounds: Normal heart sounds, S1 normal and S2 normal. No murmur heard.    No friction rub. No gallop.  Pulmonary:     Effort: Pulmonary effort is normal. No tachypnea or respiratory distress.     Breath sounds: Normal breath sounds. No decreased breath sounds, wheezing, rhonchi or rales.  Abdominal:     General: Bowel sounds are normal.     Palpations: Abdomen is soft.     Tenderness: There is no abdominal tenderness.  Musculoskeletal:     Cervical back: Normal range of motion and neck supple.  Skin:    General: Skin is warm and dry.     Findings: No rash.  Neurological:     Mental Status: She is alert.  Psychiatric:        Mood and Affect: Mood is not anxious or depressed.        Speech: Speech normal.        Behavior: Behavior normal. Behavior is cooperative.        Thought Content: Thought content normal.        Judgment: Judgment normal.       Results for orders placed or performed in visit on 12/24/24  Anaerobic and Aerobic Culture   Collection Time: 12/24/24  3:00 PM   Specimen: Skin   Skin L08.9 skin infect  Result Value Ref Range   Anaerobic Culture Final report    Result 1 Comment    Aerobic Culture Final report    Result 1 Mixed skin flora     Assessment and Plan  Essential hypertension, benign Assessment & Plan: Stable, chronic.  Continue current medication.  Amlodipine  5 mg daily, losartan  100 mg  daily.   Vaginal vault prolapse after hysterectomy -     Ibuprofen ; Take 1 tablet (600 mg total) by mouth every 6 (six) hours as needed.  Dispense: 30 tablet; Refill: 0  Rectocele Assessment & Plan:  S/P surgical repair.   History of prediabetes Assessment & Plan:  Resolved with lifestyle changes.   MENOPAUSAL SYNDROME Assessment & Plan:  Well controlled hot flashes on current medication dose estradiol   0.5 mg daily   History of panic attacks Assessment & Plan:  Resolved. No longer needing alprazolam .   Osteoarthritis of both thumbs Assessment & Plan:  Chronic, minimal issue when using OTC cream   Family history of premature coronary heart disease Assessment & Plan: Followed by cardiology( Dr. Rosario) for family history of early CAD. ECHO 2/205 EF 60-65%, trivial MR  06/2024 coronary CT 45%   Other orders -     Chlorhexidine  Gluconate; Use as directed 5 mLs in the mouth or throat 2 (two) times daily.  Dispense: 473 mL; Refill: 5    Return in about 9 months (around 10/10/2025) for phone AMW with Erminio,  fasting labs then CPE with me.   Greig Ring, MD  "

## 2025-01-10 NOTE — Assessment & Plan Note (Signed)
S/P surgical repair 

## 2025-01-10 NOTE — Patient Instructions (Signed)
"   Talk with son about health care power of attorney.  "

## 2025-01-10 NOTE — Assessment & Plan Note (Addendum)
 Stable, chronic.  Continue current medication.  Amlodipine  5 mg daily, losartan  100 mg daily.

## 2025-01-14 ENCOUNTER — Other Ambulatory Visit: Payer: Self-pay | Admitting: Cardiovascular Disease

## 2025-01-14 ENCOUNTER — Other Ambulatory Visit: Payer: Self-pay

## 2025-01-14 DIAGNOSIS — R0609 Other forms of dyspnea: Secondary | ICD-10-CM

## 2025-03-06 ENCOUNTER — Ambulatory Visit: Admitting: Cardiovascular Disease

## 2025-10-21 ENCOUNTER — Ambulatory Visit: Admitting: Dermatology

## 2025-10-23 ENCOUNTER — Ambulatory Visit
